# Patient Record
Sex: Female | Born: 1985 | Race: White | Hispanic: No | Smoking: Never smoker
Health system: Southern US, Community
[De-identification: ages and names within clinical notes are randomized; demographics above are authoritative.]

## PROBLEM LIST (undated history)

## (undated) DIAGNOSIS — E785 Hyperlipidemia, unspecified: Secondary | ICD-10-CM

## (undated) DIAGNOSIS — M84375A Stress fracture, left foot, initial encounter for fracture: Secondary | ICD-10-CM

## (undated) DIAGNOSIS — S92909A Unspecified fracture of unspecified foot, initial encounter for closed fracture: Secondary | ICD-10-CM

## (undated) DIAGNOSIS — R51 Headache: Secondary | ICD-10-CM

## (undated) DIAGNOSIS — R7989 Other specified abnormal findings of blood chemistry: Secondary | ICD-10-CM

## (undated) DIAGNOSIS — I890 Lymphedema, not elsewhere classified: Secondary | ICD-10-CM

## (undated) DIAGNOSIS — M722 Plantar fascial fibromatosis: Secondary | ICD-10-CM

## (undated) DIAGNOSIS — G43909 Migraine, unspecified, not intractable, without status migrainosus: Secondary | ICD-10-CM

## (undated) HISTORY — DX: Other specified abnormal findings of blood chemistry: R79.89

## (undated) HISTORY — DX: Hyperlipidemia, unspecified: E78.5

## (undated) HISTORY — DX: Headache: R51

## (undated) HISTORY — PX: INTRAUTERINE DEVICE (IUD) INSERTION: SHX5877

## (undated) HISTORY — DX: Migraine, unspecified, not intractable, without status migrainosus: G43.909

## (undated) HISTORY — DX: Stress fracture, left foot, initial encounter for fracture: M84.375A

## (undated) HISTORY — DX: Lymphedema, not elsewhere classified: I89.0

## (undated) HISTORY — DX: Unspecified fracture of unspecified foot, initial encounter for closed fracture: S92.909A

## (undated) HISTORY — DX: Plantar fascial fibromatosis: M72.2

---

## 2012-05-24 ENCOUNTER — Encounter: Payer: Self-pay | Admitting: Family

## 2012-05-24 ENCOUNTER — Ambulatory Visit (INDEPENDENT_AMBULATORY_CARE_PROVIDER_SITE_OTHER): Payer: Self-pay | Admitting: Family

## 2012-05-24 VITALS — BP 108/80 | HR 86 | Ht 66.5 in | Wt 159.0 lb

## 2012-05-24 DIAGNOSIS — M79661 Pain in right lower leg: Secondary | ICD-10-CM

## 2012-05-24 DIAGNOSIS — M25521 Pain in right elbow: Secondary | ICD-10-CM

## 2012-05-24 DIAGNOSIS — M79609 Pain in unspecified limb: Secondary | ICD-10-CM

## 2012-05-24 DIAGNOSIS — M25529 Pain in unspecified elbow: Secondary | ICD-10-CM

## 2012-05-24 LAB — CBC WITH DIFFERENTIAL/PLATELET
Eosinophils Relative: 0.3 % (ref 0.0–5.0)
Lymphs Abs: 2.5 10*3/uL (ref 0.7–4.0)
MCV: 89.6 fl (ref 78.0–100.0)
Monocytes Relative: 5.6 % (ref 3.0–12.0)
Neutro Abs: 5.2 10*3/uL (ref 1.4–7.7)
Platelets: 225 10*3/uL (ref 150.0–400.0)
RBC: 4.31 Mil/uL (ref 3.87–5.11)

## 2012-05-24 LAB — COMPREHENSIVE METABOLIC PANEL
ALT: 11 U/L (ref 0–35)
AST: 18 U/L (ref 0–37)
Albumin: 3.7 g/dL (ref 3.5–5.2)
CO2: 22 mEq/L (ref 19–32)
Calcium: 8.8 mg/dL (ref 8.4–10.5)
Chloride: 107 mEq/L (ref 96–112)
GFR: 117.82 mL/min (ref >60.00)
Potassium: 3.9 mEq/L (ref 3.5–5.1)
Sodium: 137 mEq/L (ref 135–145)
Total Protein: 6.9 g/dL (ref 6.0–8.3)

## 2012-05-24 NOTE — Progress Notes (Signed)
Subjective:    Patient ID: Wendy Molina, female    DOB: [birth year], 27 y.o.   MRN: [id number]  HPI 27 year old white female, nonsmoker, new patient to the practice is in to be established. She has concerns about lateral calf pain x2 years. Reports the pain occurring about once a week, describes it as a cramping sensation to both calves that lasts about an hour. She rates the pain as 7-8/10, that responds to Motrin. She also takes warm baths that helps. She's had this pain evaluated in the past her primary care provider his blood tocolysis been idiopathic.  Patient also complains of bilateral arm pain, the left worse in the right, ongoing x2 months. She describes it as a tingling and sharp sensation. Denies any swelling. Denies any chest pain. Has had an increase in stress in her life over the last several weeks that she feels will improve. Has been tested for rheumatoid arthritis in the past that was negative. A family history of any thyroid dysfunction. No history of any neck injuries.   Review of Systems  Constitutional: Negative.   HENT: Negative.   Respiratory: Negative.   Cardiovascular: Negative.   Gastrointestinal: Negative.   Endocrine: Negative.   Genitourinary: Negative.   Musculoskeletal: Positive for myalgias. Negative for back pain, joint swelling and arthralgias.       Bilateral calf pain and bilateral arm pain  Neurological: Negative.   Hematological: Negative.   Psychiatric/Behavioral: Negative.    Past Medical History  Diagnosis Date  . Hyperlipidemia   . Headache     History   Social History  . Marital Status: Single    Spouse Name: N/A    Number of Children: N/A  . Years of Education: N/A   Occupational History  . Not on file.   Social History Main Topics  . Smoking status: Never Smoker   . Smokeless tobacco: Not on file  . Alcohol Use: Yes  . Drug Use: No  . Sexually Active: Not on file   Other Topics Concern  . Not on file   Social History  Narrative  . No narrative on file    No past surgical history on file.  Family History  Problem Relation Age of Onset  . Arthritis Mother   . Heart disease Maternal Aunt     Allergies not on file  No current outpatient prescriptions on file prior to visit.   No current facility-administered medications on file prior to visit.    BP 108/80  Pulse 86  Ht 5' 6.5" (1.689 m)  Wt 159 lb (72.122 kg)  BMI 25.28 kg/m2  SpO2 99%  LMP 04/18/2014chart    Objective:   Physical Exam  Constitutional: She is oriented to person, place, and time. She appears well-developed and well-nourished.  Neck: Normal range of motion. Neck supple.  Cardiovascular: Normal rate, regular rhythm and normal heart sounds.   Pulmonary/Chest: Effort normal and breath sounds normal.  Abdominal: Soft. Bowel sounds are normal.  Musculoskeletal: Normal range of motion. She exhibits no edema and no tenderness.  Neurological: She is alert and oriented to person, place, and time. She has normal reflexes.  Skin: Skin is warm and dry.  Psychiatric: She has a normal mood and affect.          Assessment & Plan:  Assessment:  1. Myalgias 2. Bilateral calf pain 3. The lateral arm pain  Plan: Send labs to include ANA, TSH, CMP, CBC will notify patient pending results. We'll discuss further  treatment plan thereafter. Consider ultrasound of the lower extremities bilaterally if her labs are normal. Also consider anxiety as a culprit.

## 2012-05-25 ENCOUNTER — Ambulatory Visit: Payer: Managed Care, Other (non HMO)

## 2012-05-25 DIAGNOSIS — R6889 Other general symptoms and signs: Secondary | ICD-10-CM

## 2012-05-25 LAB — ANA: Anti Nuclear Antibody(ANA): NEGATIVE

## 2012-06-15 ENCOUNTER — Other Ambulatory Visit (INDEPENDENT_AMBULATORY_CARE_PROVIDER_SITE_OTHER): Payer: Managed Care, Other (non HMO)

## 2012-06-15 DIAGNOSIS — Z0000 Encounter for general adult medical examination without abnormal findings: Secondary | ICD-10-CM

## 2012-06-15 LAB — LIPID PANEL
Cholesterol: 181 mg/dL (ref 0–200)
HDL: 61.5 mg/dL (ref >39.00)
LDL Cholesterol: 105 mg/dL — ABNORMAL HIGH (ref 0–99)
Triglycerides: 73 mg/dL (ref 0.0–149.0)
VLDL: 14.6 mg/dL (ref 0.0–40.0)

## 2012-06-15 LAB — CBC WITH DIFFERENTIAL/PLATELET
Basophils Relative: 1 % (ref 0.0–3.0)
Eosinophils Relative: 0.6 % (ref 0.0–5.0)
HCT: 40.8 % (ref 36.0–46.0)
Hemoglobin: 14.1 g/dL (ref 12.0–15.0)
Lymphs Abs: 2.8 10*3/uL (ref 0.7–4.0)
Monocytes Relative: 8 % (ref 3.0–12.0)
Neutro Abs: 3.1 10*3/uL (ref 1.4–7.7)
RBC: 4.61 Mil/uL (ref 3.87–5.11)
RDW: 13.4 % (ref 11.5–14.6)

## 2012-06-15 LAB — BASIC METABOLIC PANEL
GFR: 99.6 mL/min (ref >60.00)
Glucose, Bld: 81 mg/dL (ref 70–99)
Potassium: 4.3 mEq/L (ref 3.5–5.1)
Sodium: 138 mEq/L (ref 135–145)

## 2012-06-15 LAB — HEPATIC FUNCTION PANEL
ALT: 11 U/L (ref 0–35)
AST: 19 U/L (ref 0–37)
Total Bilirubin: 0.8 mg/dL (ref 0.3–1.2)
Total Protein: 7.1 g/dL (ref 6.0–8.3)

## 2012-06-15 LAB — POCT URINALYSIS DIPSTICK
Glucose, UA: NEGATIVE
Spec Grav, UA: 1.025
Urobilinogen, UA: 0.2
pH, UA: 6.5

## 2012-06-22 ENCOUNTER — Encounter: Payer: Self-pay | Admitting: Family

## 2012-06-22 ENCOUNTER — Ambulatory Visit (INDEPENDENT_AMBULATORY_CARE_PROVIDER_SITE_OTHER): Payer: Managed Care, Other (non HMO) | Admitting: Family

## 2012-06-22 VITALS — BP 116/78 | HR 78 | Ht 66.0 in | Wt 156.0 lb

## 2012-06-22 DIAGNOSIS — Z23 Encounter for immunization: Secondary | ICD-10-CM

## 2012-06-22 DIAGNOSIS — Z0000 Encounter for general adult medical examination without abnormal findings: Secondary | ICD-10-CM

## 2012-06-22 NOTE — Progress Notes (Signed)
  Subjective:    Patient ID: Wendy Molina, female    DOB: [birth year], 27 y.o.   MRN: [id number]  HPI 27 year old white female, nonsmoker is in for complete physical exam today. She denies any concerns. She sees gynecology for Pap and pelvic exams.   Review of Systems  Constitutional: Negative.   HENT: Negative.   Eyes: Negative.   Respiratory: Negative.   Cardiovascular: Negative.   Gastrointestinal: Negative.   Endocrine: Negative.   Genitourinary: Negative.   Musculoskeletal: Negative.   Skin: Negative.   Allergic/Immunologic: Negative.   Neurological: Negative.   Hematological: Negative.   Psychiatric/Behavioral: Negative.    Past Medical History  Diagnosis Date  . Hyperlipidemia   . Headache(784.0)     History   Social History  . Marital Status: Single    Spouse Name: N/A    Number of Children: N/A  . Years of Education: N/A   Occupational History  . Not on file.   Social History Main Topics  . Smoking status: Never Smoker   . Smokeless tobacco: Not on file  . Alcohol Use: Yes  . Drug Use: No  . Sexually Active: Not on file   Other Topics Concern  . Not on file   Social History Narrative  . No narrative on file    History reviewed. No pertinent past surgical history.  Family History  Problem Relation Age of Onset  . Arthritis Mother   . Heart disease Maternal Aunt     No Known Allergies  Current Outpatient Prescriptions on File Prior to Visit  Medication Sig Dispense Refill  . norelgestromin-ethinyl estradiol (ORTHO EVRA) 150-20 MCG/24HR transdermal patch Place 1 patch onto the skin once a week.       No current facility-administered medications on file prior to visit.    Pulse 78  Ht 5\' 6"  (1.676 m)  Wt 156 lb (70.761 kg)  BMI 25.19 kg/m2  SpO2 99%  LMP 04/18/2014chart    Objective:   Physical Exam  Constitutional: She is oriented to person, place, and time. She appears well-developed and well-nourished.  HENT:  Right Ear:  External ear normal.  Left Ear: External ear normal.  Nose: Nose normal.  Mouth/Throat: Oropharynx is clear and moist.  Neck: Normal range of motion. Neck supple.  Cardiovascular: Normal rate, regular rhythm and normal heart sounds.   Pulmonary/Chest: Effort normal and breath sounds normal.  Abdominal: Soft. Bowel sounds are normal.  Musculoskeletal: Normal range of motion.  Neurological: She is alert and oriented to person, place, and time.  Skin: Skin is warm and dry.  Psychiatric: She has a normal mood and affect.      TDAP administered    Assessment & Plan:  Assessment:  1. CPX  Plan: Anticipatory guidance appropriate for age to include safe sex practices, monthly self breast exams, seatbelt safety, hand gun safety, motorcycles and ATVs.Call the office with any questions or concerns. Recheck as scheduled and as needed.

## 2012-08-19 ENCOUNTER — Encounter: Payer: Self-pay | Admitting: Family

## 2012-08-19 ENCOUNTER — Ambulatory Visit (INDEPENDENT_AMBULATORY_CARE_PROVIDER_SITE_OTHER): Payer: Managed Care, Other (non HMO) | Admitting: Family

## 2012-08-19 VITALS — BP 110/68 | HR 88 | Wt 162.0 lb

## 2012-08-19 DIAGNOSIS — L0291 Cutaneous abscess, unspecified: Secondary | ICD-10-CM

## 2012-08-19 DIAGNOSIS — L0390 Cellulitis, unspecified: Secondary | ICD-10-CM

## 2012-08-19 MED ORDER — NORELGESTROMIN-ETH ESTRADIOL 150-35 MCG/24HR TD PTWK: 1.0000 | MEDICATED_PATCH | TRANSDERMAL | Status: DC

## 2012-08-19 MED ORDER — DOXYCYCLINE HYCLATE 100 MG PO TABS: 100.0000 mg | ORAL_TABLET | Freq: Two times a day (BID) | ORAL | Status: DC

## 2012-08-19 NOTE — Patient Instructions (Addendum)
Abscess An abscess is an infected area that contains a collection of pus and debris.It can occur in almost any part of the body. An abscess is also known as a furuncle or boil. CAUSES  An abscess occurs when tissue gets infected. This can occur from blockage of oil or sweat glands, infection of hair follicles, or a minor injury to the skin. As the body tries to fight the infection, pus collects in the area and creates pressure under the skin. This pressure causes pain. People with weakened immune systems have difficulty fighting infections and get certain abscesses more often.  SYMPTOMS Usually an abscess develops on the skin and becomes a painful mass that is red, warm, and tender. If the abscess forms under the skin, you may feel a moveable soft area under the skin. Some abscesses break open (rupture) on their own, but most will continue to get worse without care. The infection can spread deeper into the body and eventually into the bloodstream, causing you to feel ill.  DIAGNOSIS  Your caregiver will take your medical history and perform a physical exam. A sample of fluid may also be taken from the abscess to determine what is causing your infection. TREATMENT  Your caregiver may prescribe antibiotic medicines to fight the infection. However, taking antibiotics alone usually does not cure an abscess. Your caregiver may need to make a small cut (incision) in the abscess to drain the pus. In some cases, gauze is packed into the abscess to reduce pain and to continue draining the area. HOME CARE INSTRUCTIONS   Only take over-the-counter or prescription medicines for pain, discomfort, or fever as directed by your caregiver.  If you were prescribed antibiotics, take them as directed. Finish them even if you start to feel better.  If gauze is used, follow your caregiver's directions for changing the gauze.  To avoid spreading the infection:  Keep your draining abscess covered with a  bandage.  Wash your hands well.  Do not share personal care items, towels, or whirlpools with others.  Avoid skin contact with others.  Keep your skin and clothes clean around the abscess.  Keep all follow-up appointments as directed by your caregiver. SEEK MEDICAL CARE IF:   You have increased pain, swelling, redness, fluid drainage, or bleeding.  You have muscle aches, chills, or a general ill feeling.  You have a fever. MAKE SURE YOU:   Understand these instructions.  Will watch your condition.  Will get help right away if you are not doing well or get worse. Document Released: 10/22/2004 Document Revised: 07/14/2011 Document Reviewed: 03/27/2011 [employer] Patient Information 2014 [employer].  

## 2012-08-19 NOTE — Progress Notes (Signed)
  Subjective:    Patient ID: Wendy Molina, female    DOB: 01-03-1986, 27 y.o.   MRN: [id number]  HPI 27 year old white female, nonsmoker is in today with complaints knot behind her right ear present times several days. She describes it as itchy, painful and tender to touch. Denies any sneezing, coughing or congestion. No drainage or discharge.   Review of Systems  Constitutional: Negative.   HENT: Positive for ear pain. Negative for congestion, rhinorrhea, postnasal drip and ear discharge.   Respiratory: Negative.   Cardiovascular: Negative.   Gastrointestinal: Negative.   Musculoskeletal: Negative.   Skin: Negative.   Allergic/Immunologic: Negative.   Neurological: Negative.   Hematological: Negative.   Psychiatric/Behavioral: Negative.    Past Medical History  Diagnosis Date  . Hyperlipidemia   . Headache(784.0)     History   Social History  . Marital Status: Single    Spouse Name: N/A    Number of Children: N/A  . Years of Education: N/A   Occupational History  . Not on file.   Social History Main Topics  . Smoking status: Never Smoker   . Smokeless tobacco: Not on file  . Alcohol Use: Yes  . Drug Use: No  . Sexually Active: Not on file   Other Topics Concern  . Not on file   Social History Narrative  . No narrative on file    History reviewed. No pertinent past surgical history.  Family History  Problem Relation Age of Onset  . Arthritis Mother   . Heart disease Maternal Aunt     No Known Allergies  No current outpatient prescriptions on file prior to visit.   No current facility-administered medications on file prior to visit.    BP 110/68  Pulse 88  Wt 162 lb (73.483 kg)  BMI 26.16 kg/m2  SpO2 99%chart    Objective:   Physical Exam  Constitutional: She is oriented to person, place, and time. She appears well-developed and well-nourished.  HENT:  Right Ear: External ear normal.  Left Ear: External ear normal.  Nose: Nose normal.   Mouth/Throat: Oropharynx is clear and moist.  Small, tender, 0.5 cm palpable lesion noted to the posterior auricle. No drainage or discharge. Redness noted to the site  Neck: Normal range of motion. Neck supple.  Cardiovascular: Normal rate, regular rhythm and normal heart sounds.   Pulmonary/Chest: Effort normal and breath sounds normal.  Neurological: She is alert and oriented to person, place, and time.  Skin: Skin is warm and dry.  Psychiatric: She has a normal mood and affect.          Assessment & Plan:  Assessment: 1. Cellulitis with small abscess  Plan: Doxycycline 100 mg twice a day x7 days. Call the office with any questions or concerns. Check as scheduled, and as needed.

## 2012-09-21 ENCOUNTER — Ambulatory Visit (INDEPENDENT_AMBULATORY_CARE_PROVIDER_SITE_OTHER): Payer: Managed Care, Other (non HMO) | Admitting: Certified Nurse Midwife

## 2012-09-21 ENCOUNTER — Encounter: Payer: Self-pay | Admitting: Certified Nurse Midwife

## 2012-09-21 VITALS — BP 122/78 | HR 60 | Temp 98.9°F | Ht 66.0 in | Wt 163.0 lb

## 2012-09-21 DIAGNOSIS — Z202 Contact with and (suspected) exposure to infections with a predominantly sexual mode of transmission: Secondary | ICD-10-CM

## 2012-09-21 DIAGNOSIS — B373 Candidiasis of vulva and vagina: Secondary | ICD-10-CM

## 2012-09-21 DIAGNOSIS — N760 Acute vaginitis: Secondary | ICD-10-CM

## 2012-09-21 DIAGNOSIS — B3731 Acute candidiasis of vulva and vagina: Secondary | ICD-10-CM

## 2012-09-21 MED ORDER — METRONIDAZOLE 0.75 % VA GEL: 1.0000 | Freq: Every day | VAGINAL | Status: DC

## 2012-09-21 MED ORDER — FLUCONAZOLE 150 MG PO TABS: 150.0000 mg | ORAL_TABLET | Freq: Once | ORAL | Status: DC

## 2012-09-21 NOTE — Progress Notes (Signed)
Note reviewed, agree with plan.  Nemesio Castrillon, MD  

## 2012-09-21 NOTE — Progress Notes (Signed)
27 y.o.Single Caucasian female G0P0000 with a 2 week(s) history of the following:discharge described as white and thick and vulvar itching. No new personal products or thong use. Denies vaginal odor or bleeding. Sexually active: yes Last sexual activity:2 weeks ago.. Pt also reports the following associated symptoms: pain with intercourse at last occurrence. Desires STD screening. Patient has not tried over the counter treatment.   LMP: 09/13/12  Contraception; Ortho Evra, consistent use.  O: Healthy female,WDWN Affect: normal, orientation x 3    Exam:  RUE:AVWUJW, Bartholin's, Urethra, Skene's normal, no lesions noted                Vag:no lesions, discharge: copious, white, watery and malodorous, pH 5.5, wet prep done                Cx:  normal appearance, ectropian, scant bleeding with touch and non tender, no CMT                Uterus:anteverted, non-tender, normal shape and consistency                Adnexa: normal adnexa and no mass, fullness, tenderness  Wet Prep shows: Positive for Yeast and BV, negative for Trich  A:BV Yeast vaginitis STD screening  P:Reviewed findings of yeast and BV. Questions addressed. Rx Metrogel see order Rx Diflucan see order Lab:GC/Chlamydia   Symptomatic local care discussed. Abstinence from intercourse discussed. Discussed safe sex.  Rv prn

## 2012-09-22 LAB — IPS N GONORRHOEA AND CHLAMYDIA BY PCR

## 2012-10-14 ENCOUNTER — Ambulatory Visit: Payer: Managed Care, Other (non HMO) | Admitting: Family

## 2012-10-17 ENCOUNTER — Ambulatory Visit (INDEPENDENT_AMBULATORY_CARE_PROVIDER_SITE_OTHER): Payer: Managed Care, Other (non HMO) | Admitting: Family

## 2012-10-17 ENCOUNTER — Encounter: Payer: Self-pay | Admitting: Family

## 2012-10-17 VITALS — BP 120/80 | HR 74 | Wt 160.0 lb

## 2012-10-17 DIAGNOSIS — N920 Excessive and frequent menstruation with regular cycle: Secondary | ICD-10-CM

## 2012-10-17 DIAGNOSIS — R51 Headache: Secondary | ICD-10-CM

## 2012-10-17 NOTE — Patient Instructions (Signed)

## 2012-10-18 ENCOUNTER — Encounter: Payer: Self-pay | Admitting: Family

## 2012-10-18 LAB — CBC WITH DIFFERENTIAL/PLATELET
Basophils Absolute: 0.1 10*3/uL (ref 0.0–0.1)
Eosinophils Absolute: 0.1 10*3/uL (ref 0.0–0.7)
Eosinophils Relative: 0.8 % (ref 0.0–5.0)
Lymphocytes Relative: 27.9 % (ref 12.0–46.0)
MCHC: 33.9 g/dL (ref 30.0–36.0)
MCV: 88.3 fl (ref 78.0–100.0)
Monocytes Absolute: 0.7 10*3/uL (ref 0.1–1.0)
Neutrophils Relative %: 63.3 % (ref 43.0–77.0)
Platelets: 265 10*3/uL (ref 150.0–400.0)
RBC: 4.36 Mil/uL (ref 3.87–5.11)
RDW: 13.6 % (ref 11.5–14.6)
WBC: 10.1 10*3/uL (ref 4.5–10.5)

## 2012-10-18 LAB — BASIC METABOLIC PANEL
Calcium: 9.3 mg/dL (ref 8.4–10.5)
Chloride: 108 mEq/L (ref 96–112)
GFR: 119.63 mL/min (ref >60.00)
Glucose, Bld: 87 mg/dL (ref 70–99)
Potassium: 4.5 mEq/L (ref 3.5–5.1)
Sodium: 137 mEq/L (ref 135–145)

## 2012-10-18 NOTE — Progress Notes (Signed)
Subjective:    Patient ID: Wendy Molina, female    DOB: 1985-07-16, 27 y.o.   MRN: [id number]  HPI 27 year old white female, nonsmoker is in with complaints of a headache off and on x2 weeks. The headache typically comes at the crown of her hand and last all day. She takes Motrin that helps. Describes it as more of a pressure than pain. Has sensitivity to light but not noise. No nausea vomiting. Normal stress level. Consumes about 10 ounces of caffeine daily. Last menstrual period 2 weeks ago and was much heavier than usual. Denies any concerns or pregnancy. Is on Ortho Evra.  Review of Systems  Constitutional: Negative.   Respiratory: Negative.   Cardiovascular: Negative.   Endocrine: Negative.   Genitourinary: Positive for menstrual problem.       Menstrual cycle heavier this month and normal  Musculoskeletal: Negative.   Skin: Negative.   Allergic/Immunologic: Negative.   Neurological: Positive for headaches. Negative for dizziness and light-headedness.  Hematological: Negative.   Psychiatric/Behavioral: Negative.    Past Medical History  Diagnosis Date  . Hyperlipidemia   . Headache(784.0)     migraine w/o aura    History   Social History  . Marital Status: Single    Spouse Name: N/A    Number of Children: 0  . Years of Education: N/A   Occupational History  . Not on file.   Social History Main Topics  . Smoking status: Never Smoker   . Smokeless tobacco: Never Used  . Alcohol Use: Yes  . Drug Use: No  . Sexual Activity: Yes    Partners: Male    Birth Control/ Protection: Pill   Other Topics Concern  . Not on file   Social History Narrative  . No narrative on file    History reviewed. No pertinent past surgical history.  Family History  Problem Relation Age of Onset  . Arthritis Mother   . Heart disease Maternal Aunt   . Lung cancer Maternal Grandmother   . Leukemia Paternal Grandmother     unsure correct type of cancer    No Known  Allergies  Current Outpatient Prescriptions on File Prior to Visit  Medication Sig Dispense Refill  . Multiple Vitamin (MULTIVITAMIN) tablet Take 1 tablet by mouth daily.      . norelgestromin-ethinyl estradiol (ORTHO EVRA) 150-35 MCG/24HR transdermal patch Place 1 patch onto the skin once a week.  3 patch  11  . fluconazole (DIFLUCAN) 150 MG tablet Take 1 tablet (150 mg total) by mouth once. Take one tablet.  Repeat in 5 days.  2 tablet  0  . metroNIDAZOLE (METROGEL) 0.75 % vaginal gel Place 1 Applicatorful vaginally at bedtime. For  5 days  70 g  0   No current facility-administered medications on file prior to visit.    BP 120/80  Pulse 74  Wt 160 lb (72.576 kg)  BMI 25.84 kg/m2  LMP 08/19/2014chart    Objective:   Physical Exam  Constitutional: She is oriented to person, place, and time. She appears well-developed and well-nourished.  HENT:  Right Ear: External ear normal.  Left Ear: External ear normal.  Nose: Nose normal.  Mouth/Throat: Oropharynx is clear and moist.  Neck: Normal range of motion. Neck supple.  Cardiovascular: Normal rate, regular rhythm and normal heart sounds.   Pulmonary/Chest: Effort normal and breath sounds normal.  Musculoskeletal: Normal range of motion.  Neurological: She is alert and oriented to person, place, and time. She has normal  reflexes. She displays normal reflexes. No cranial nerve deficit. Coordination normal.  Skin: Skin is warm and dry.  Psychiatric: She has a normal mood and affect.          Assessment & Plan:   assessment: 1. Headache likely hormonal related 2. Menorrhagia  Plan: Additionally, patient's menstrual cycles are normal and not heavy. I believe that the headache is related to hormonal imbalance. Ibuprofen as needed for the headache. Labs sent including TSH CBC and BMP when the patient can results. If headache persists, we'll consider a referral or scan.

## 2012-12-01 ENCOUNTER — Other Ambulatory Visit: Payer: Self-pay

## 2013-03-02 ENCOUNTER — Encounter: Payer: Self-pay | Admitting: Family

## 2013-03-02 ENCOUNTER — Ambulatory Visit (INDEPENDENT_AMBULATORY_CARE_PROVIDER_SITE_OTHER): Payer: Managed Care, Other (non HMO) | Admitting: Family

## 2013-03-02 VITALS — BP 140/84 | HR 72 | Temp 98.2°F | Resp 16 | Ht 66.0 in | Wt 164.0 lb

## 2013-03-02 DIAGNOSIS — T148XXA Other injury of unspecified body region, initial encounter: Secondary | ICD-10-CM

## 2013-03-02 DIAGNOSIS — M549 Dorsalgia, unspecified: Secondary | ICD-10-CM

## 2013-03-02 MED ORDER — MELOXICAM 15 MG PO TABS: 15.0000 mg | ORAL_TABLET | Freq: Every day | ORAL | Status: DC

## 2013-03-02 MED ORDER — CYCLOBENZAPRINE HCL 5 MG PO TABS: 5.0000 mg | ORAL_TABLET | Freq: Three times a day (TID) | ORAL | Status: DC | PRN

## 2013-03-02 NOTE — Progress Notes (Signed)
   Subjective:    Patient ID: Wendy Molina, female    DOB: 10/25/1985, 28 y.o.   MRN: [id number]  Back Pain This is a recurrent problem. The current episode started in the past 7 days. The problem occurs constantly. The problem is unchanged. The pain is present in the lumbar spine. The quality of the pain is described as aching. The pain does not radiate. The pain is at a severity of 6/10. The symptoms are aggravated by bending and position. Pertinent negatives include no dysuria, numbness or pelvic pain. She has tried heat for the symptoms. The treatment provided no relief.      Review of Systems  Genitourinary: Negative for dysuria and pelvic pain.  Musculoskeletal: Positive for back pain.  Neurological: Negative for numbness.  All other systems reviewed and are negative.   Past Medical History  Diagnosis Date  . Hyperlipidemia   . Headache(784.0)     migraine w/o aura    History   Social History  . Marital Status: Single    Spouse Name: N/A    Number of Children: 0  . Years of Education: N/A   Occupational History  . Not on file.   Social History Main Topics  . Smoking status: Never Smoker   . Smokeless tobacco: Never Used  . Alcohol Use: Yes  . Drug Use: No  . Sexual Activity: Yes    Partners: Male    Birth Control/ Protection: Pill   Other Topics Concern  . Not on file   Social History Narrative  . No narrative on file    No past surgical history on file.  Family History  Problem Relation Age of Onset  . Arthritis Mother   . Heart disease Maternal Aunt   . Lung cancer Maternal Grandmother   . Leukemia Paternal Grandmother     unsure correct type of cancer    No Known Allergies  Current Outpatient Prescriptions on File Prior to Visit  Medication Sig Dispense Refill  . Multiple Vitamin (MULTIVITAMIN) tablet Take 1 tablet by mouth daily.      . norelgestromin-ethinyl estradiol (ORTHO EVRA) 150-35 MCG/24HR transdermal patch Place 1 patch onto  the skin once a week.  3 patch  11   No current facility-administered medications on file prior to visit.    BP 140/84  Pulse 72  Temp(Src) 98.2 F (36.8 C)  Resp 16  Ht 5\' 6"  (1.676 m)  Wt 164 lb (74.39 kg)  BMI 26.48 kg/m2chart    Objective:   Physical Exam  Constitutional: She is oriented to person, place, and time. She appears well-developed and well-nourished.  HENT:  Head: Normocephalic.  Cardiovascular: Normal rate, regular rhythm and normal heart sounds.   Pulmonary/Chest: Breath sounds normal.  Abdominal: Soft.  Musculoskeletal:  Constant pain to R lower lumbar spine; unchanged with palpation  Neurological: She is alert and oriented to person, place, and time.  Skin: Skin is warm.          Assessment & Plan:  L was seen today for back pain.  Diagnoses and associated orders for this visit:  Back pain - POC Urinalysis Dipstick  Other Orders - cyclobenzaprine (FLEXERIL) 5 MG tablet; Take 1 tablet (5 mg total) by mouth 3 (three) times daily as needed for muscle spasms. - meloxicam (MOBIC) 15 MG tablet; Take 1 tablet (15 mg total) by mouth daily.   Note by Davonna [email or web address], FNP Student

## 2013-03-02 NOTE — Patient Instructions (Signed)
°  Muscle Cramps and Spasms °Muscle cramps and spasms occur when a muscle or muscles tighten and you have no control over this tightening (involuntary muscle contraction). They are a common problem and can develop in any muscle. The most common place is in the calf muscles of the leg. Both muscle cramps and muscle spasms are involuntary muscle contractions, but they also have differences:  °· Muscle cramps are sporadic and painful. They may last a few seconds to a quarter of an hour. Muscle cramps are often more forceful and last longer than muscle spasms. °· Muscle spasms may or may not be painful. They may also last just a few seconds or much longer. °CAUSES  °It is uncommon for cramps or spasms to be due to a serious underlying problem. In many cases, the cause of cramps or spasms is unknown. Some common causes are:  °· Overexertion.   °· Overuse from repetitive motions (doing the same thing over and over).   °· Remaining in a certain position for a long period of time.   °· Improper preparation, form, or technique while performing a sport or activity.   °· Dehydration.   °· Injury.   °· Side effects of some medicines.   °· Abnormally low levels of the salts and ions in your blood (electrolytes), especially potassium and calcium. This could happen if you are taking water pills (diuretics) or you are pregnant.   °Some underlying medical problems can make it more likely to develop cramps or spasms. These include, but are not limited to:  °· Diabetes.   °· Parkinson disease.   °· Hormone disorders, such as thyroid problems.   °· Alcohol abuse.   °· Diseases specific to muscles, joints, and bones.   °· Blood vessel disease where not enough blood is getting to the muscles.   °HOME CARE INSTRUCTIONS  °· Stay well hydrated. Drink enough water and fluids to keep your urine clear or pale yellow. °· It may be helpful to massage, stretch, and relax the affected muscle. °· For tight or tense muscles, use a warm towel, heating  pad, or hot shower water directed to the affected area. °· If you are sore or have pain after a cramp or spasm, applying ice to the affected area may relieve discomfort. °· Put ice in a plastic bag. °· Place a towel between your skin and the bag. °· Leave the ice on for 15-20 minutes, 03-04 times a day. °· Medicines used to treat a known cause of cramps or spasms may help reduce their frequency or severity. Only take over-the-counter or prescription medicines as directed by your caregiver. °SEEK MEDICAL CARE IF:  °Your cramps or spasms get more severe, more frequent, or do not improve over time.  °MAKE SURE YOU:  °· Understand these instructions. °· Will watch your condition. °· Will get help right away if you are not doing well or get worse. °Document Released: 07/04/2001 Document Revised: 05/09/2012 Document Reviewed: 12/30/2011 °[employer]® Patient Information ©2014 [employer]. ° ° °

## 2013-03-02 NOTE — Progress Notes (Signed)
Pre visit review using our clinic review tool, if applicable. No additional management support is needed unless otherwise documented below in the visit note. 

## 2013-05-09 ENCOUNTER — Ambulatory Visit (INDEPENDENT_AMBULATORY_CARE_PROVIDER_SITE_OTHER): Payer: Managed Care, Other (non HMO) | Admitting: Family

## 2013-05-09 ENCOUNTER — Encounter: Payer: Self-pay | Admitting: Family

## 2013-05-09 VITALS — BP 120/80 | HR 63 | Temp 98.4°F | Wt 159.0 lb

## 2013-05-09 DIAGNOSIS — T148XXA Other injury of unspecified body region, initial encounter: Secondary | ICD-10-CM

## 2013-05-09 DIAGNOSIS — G43909 Migraine, unspecified, not intractable, without status migrainosus: Secondary | ICD-10-CM

## 2013-05-09 LAB — CBC WITH DIFFERENTIAL/PLATELET
BASOS ABS: 0 10*3/uL (ref 0.0–0.1)
BASOS PCT: 0.6 % (ref 0.0–3.0)
Eosinophils Absolute: 0 10*3/uL (ref 0.0–0.7)
Eosinophils Relative: 0.5 % (ref 0.0–5.0)
HCT: 39.4 % (ref 36.0–46.0)
Hemoglobin: 13.4 g/dL (ref 12.0–15.0)
Lymphocytes Relative: 36.9 % (ref 12.0–46.0)
Lymphs Abs: 2.2 10*3/uL (ref 0.7–4.0)
MCHC: 34 g/dL (ref 30.0–36.0)
MCV: 88.8 fl (ref 78.0–100.0)
MONO ABS: 0.5 10*3/uL (ref 0.1–1.0)
Monocytes Relative: 7.6 % (ref 3.0–12.0)
NEUTROS PCT: 54.4 % (ref 43.0–77.0)
Neutro Abs: 3.3 10*3/uL (ref 1.4–7.7)
Platelets: 223 10*3/uL (ref 150.0–400.0)
RBC: 4.44 Mil/uL (ref 3.87–5.11)
RDW: 14.4 % (ref 11.5–14.6)
WBC: 6.1 10*3/uL (ref 4.5–10.5)

## 2013-05-09 LAB — COMPREHENSIVE METABOLIC PANEL
ALT: 26 U/L (ref 0–35)
AST: 16 U/L (ref 0–37)
Albumin: 3.6 g/dL (ref 3.5–5.2)
Alkaline Phosphatase: 41 U/L (ref 39–117)
BUN: 12 mg/dL (ref 6–23)
CO2: 25 mEq/L (ref 19–32)
CREATININE: 0.7 mg/dL (ref 0.4–1.2)
Calcium: 8.9 mg/dL (ref 8.4–10.5)
Chloride: 106 mEq/L (ref 96–112)
GFR: 107.27 mL/min (ref >60.00)
Glucose, Bld: 75 mg/dL (ref 70–99)
Potassium: 4.4 mEq/L (ref 3.5–5.1)
Sodium: 137 mEq/L (ref 135–145)
Total Bilirubin: 0.6 mg/dL (ref 0.3–1.2)
Total Protein: 6.7 g/dL (ref 6.0–8.3)

## 2013-05-09 LAB — SEDIMENTATION RATE: Sed Rate: 7 mm/hr (ref 0–22)

## 2013-05-09 LAB — IRON: IRON: 109 ug/dL (ref 42–145)

## 2013-05-09 MED ORDER — KETOROLAC TROMETHAMINE 60 MG/2ML IM SOLN
60.0000 mg | Freq: Once | INTRAMUSCULAR | Status: AC
  Administered 2013-05-09: 60 mg via INTRAMUSCULAR

## 2013-05-09 NOTE — Progress Notes (Signed)
Subjective:    Patient ID: Wendy Molina Wendy Molina, female    DOB: 10/24/1985, 28 y.o.   MRN: [id number]  HPI 28 year old white female, nonsmoker is in today with complaints of dull, throbbing headache that persisted around her left eye x2 weeks. She's been taking over-the-counter Motrin that helps the pain. Reports increased stress and decrease caffeine intake. Denies any blurred vision or double vision. Denies nausea or vomiting but has had sensitivity to light and noise. Last menstrual period was 04/26/2013. The concerns of pregnancy. Does have concerns of increased bruising like to have an iron level checked today.   Review of Systems  Constitutional: Negative.   Eyes: Negative.   Respiratory: Negative.   Cardiovascular: Negative.   Gastrointestinal: Negative.   Endocrine: Negative.   Musculoskeletal: Negative.   Skin: Negative.   Allergic/Immunologic: Negative for food allergies.  Hematological: Negative.   Psychiatric/Behavioral: Negative.    Past Medical History  Diagnosis Date  . Hyperlipidemia   . Headache(784.0)     migraine w/o aura    History   Social History  . Marital Status: Single    Spouse Name: N/A    Number of Children: 0  . Years of Education: N/A   Occupational History  . Not on file.   Social History Main Topics  . Smoking status: Never Smoker   . Smokeless tobacco: Never Used  . Alcohol Use: Yes  . Drug Use: No  . Sexual Activity: Yes    Partners: Male    Birth Control/ Protection: Pill   Other Topics Concern  . Not on file   Social History Narrative  . No narrative on file    No past surgical history on file.  Family History  Problem Relation Age of Onset  . Arthritis Mother   . Heart disease Maternal Aunt   . Lung cancer Maternal Grandmother   . Leukemia Paternal Grandmother     unsure correct type of cancer    No Known Allergies  Current Outpatient Prescriptions on File Prior to Visit  Medication Sig Dispense Refill  .  Multiple Vitamin (MULTIVITAMIN) tablet Take 1 tablet by mouth daily.      . norelgestromin-ethinyl estradiol (ORTHO EVRA) 150-35 MCG/24HR transdermal patch Place 1 patch onto the skin once a week.  3 patch  11  . cyclobenzaprine (FLEXERIL) 5 MG tablet Take 1 tablet (5 mg total) by mouth 3 (three) times daily as needed for muscle spasms.  30 tablet  0  . meloxicam (MOBIC) 15 MG tablet Take 1 tablet (15 mg total) by mouth daily.  30 tablet  0   No current facility-administered medications on file prior to visit.    BP 120/80  Pulse 63  Temp(Src) 98.4 F (36.9 C) (Oral)  Wt 159 lb (72.122 kg)  SpO2 98%chart will    Objective:   Physical Exam  Constitutional: She is oriented to person, place, and time. She appears well-developed and well-nourished.  HENT:  Right Ear: External ear normal.  Nose: Nose normal.  Mouth/Throat: Oropharynx is clear and moist.  Neck: Normal range of motion. Neck supple.  Cardiovascular: Normal rate, regular rhythm and normal heart sounds.   Pulmonary/Chest: Effort normal and breath sounds normal.  Neurological: She is alert and oriented to person, place, and time. She has normal reflexes.  Skin: Skin is warm and dry.  Psychiatric: She has a normal mood and affect.          Assessment & Plan:  L was seen today  for headache.  Diagnoses and associated orders for this visit:  Migraine headache - ketorolac (TORADOL) injection 60 mg; Inject 2 mLs (60 mg total) into the muscle once. - CBC with Differential - CMP - Sedimentation Rate - Iron  Bruising - CBC with Differential - Iron   Call the office with any questions or concerns. For headache preventative medicine pending the results of labs

## 2013-05-09 NOTE — Progress Notes (Signed)
Pre visit review using our clinic review tool, if applicable. No additional management support is needed unless otherwise documented below in the visit note. 

## 2013-05-09 NOTE — Patient Instructions (Signed)
Stress Management Stress is a state of physical or mental tension that often results from changes in your life or normal routine. Some common causes of stress are:  Death of a loved one.  Injuries or severe illnesses.  Getting fired or changing jobs.  Moving into a new home. Other causes may be:  Sexual problems.  Business or financial losses.  Taking on a large debt.  Regular conflict with someone at home or at work.  Constant tiredness from lack of sleep. It is not just bad things that are stressful. It may be stressful to:  Win the lottery.  Get married.  Buy a new car. The amount of stress that can be easily tolerated varies from person to person. Changes generally cause stress, regardless of the types of change. Too much stress can affect your health. It may lead to physical or emotional problems. Too little stress (boredom) may also become stressful. SUGGESTIONS TO REDUCE STRESS:  Talk things over with your family and friends. It often is helpful to share your concerns and worries. If you feel your problem is serious, you may want to get help from a professional counselor.  Consider your problems one at a time instead of lumping them all together. Trying to take care of everything at once may seem impossible. List all the things you need to do and then start with the most important one. Set a goal to accomplish 2 or 3 things each day. If you expect to do too many in a single day you will naturally fail, causing you to feel even more stressed.  Do not use alcohol or drugs to relieve stress. Although you may feel better for a short time, they do not remove the problems that caused the stress. They can also be habit forming.  Exercise regularly - at least 3 times per week. Physical exercise can help to relieve that "uptight" feeling and will relax you.  The shortest distance between despair and hope is often a good night's sleep.  Go to bed and get up on time allowing  yourself time for appointments without being rushed.  Take a short "time-out" period from any stressful situation that occurs during the day. Close your eyes and take some deep breaths. Starting with the muscles in your face, tense them, hold it for a few seconds, then relax. Repeat this with the muscles in your neck, shoulders, hand, stomach, back and legs.  Take good care of yourself. Eat a balanced diet and get plenty of rest.  Schedule time for having fun. Take a break from your daily routine to relax. HOME CARE INSTRUCTIONS   Call if you feel overwhelmed by your problems and feel you can no longer manage them on your own.  Return immediately if you feel like hurting yourself or someone else. Document Released: 07/08/2000 Document Revised: 04/06/2011 Document Reviewed: 09/06/2012 [facility] Patient Information 2014 [place], [place].   Migraine Headache A migraine headache is an intense, throbbing pain on one or both sides of your head. A migraine can last for 30 minutes to several hours. CAUSES  The exact cause of a migraine headache is not always known. However, a migraine may be caused when nerves in the brain become irritated and release chemicals that cause inflammation. This causes pain. Certain things may also trigger migraines, such as:  Alcohol.  Smoking.  Stress.  Menstruation.  Aged cheeses.  Foods or drinks that contain nitrates, glutamate, aspartame, or tyramine.  Lack of sleep.  Chocolate.  Caffeine.  Hunger.  Physical exertion.  Fatigue.  Medicines used to treat chest pain (nitroglycerine), birth control pills, estrogen, and some blood pressure medicines. SIGNS AND SYMPTOMS  Pain on one or both sides of your head.  Pulsating or throbbing pain.  Severe pain that prevents daily activities.  Pain that is aggravated by any physical activity.  Nausea, vomiting, or both.  Dizziness.  Pain with exposure to bright lights, loud noises, or  activity.  General sensitivity to bright lights, loud noises, or smells. Before you get a migraine, you may get warning signs that a migraine is coming (aura). An aura may include:  Seeing flashing lights.  Seeing bright spots, halos, or zig-zag lines.  Having tunnel vision or blurred vision.  Having feelings of numbness or tingling.  Having trouble talking.  Having muscle weakness. DIAGNOSIS  A migraine headache is often diagnosed based on:  Symptoms.  Physical exam.  A CT scan or MRI of your head. These imaging tests cannot diagnose migraines, but they can help rule out other causes of headaches. TREATMENT Medicines may be given for pain and nausea. Medicines can also be given to help prevent recurrent migraines.  HOME CARE INSTRUCTIONS  Only take over-the-counter or prescription medicines for pain or discomfort as directed by your health care provider. The use of long-term narcotics is not recommended.  Lie down in a dark, quiet room when you have a migraine.  Keep a journal to find out what may trigger your migraine headaches. For example, write down:  What you eat and drink.  How much sleep you get.  Any change to your diet or medicines.  Limit alcohol consumption.  Quit smoking if you smoke.  Get 7 9 hours of sleep, or as recommended by your health care provider.  Limit stress.  Keep lights dim if bright lights bother you and make your migraines worse. SEEK IMMEDIATE MEDICAL CARE IF:   Your migraine becomes severe.  You have a fever.  You have a stiff neck.  You have vision loss.  You have muscular weakness or loss of muscle control.  You start losing your balance or have trouble walking.  You feel faint or pass out.  You have severe symptoms that are different from your first symptoms. MAKE SURE YOU:   Understand these instructions.  Will watch your condition.  Will get help right away if you are not doing well or get worse. Document  Released: 01/12/2005 Document Revised: 11/02/2012 Document Reviewed: 09/19/2012 [facility] Patient Information 2014 [place], [place].

## 2013-05-16 ENCOUNTER — Other Ambulatory Visit: Payer: Self-pay | Admitting: Family

## 2013-05-16 MED ORDER — TRAMADOL HCL 50 MG PO TABS: 50.0000 mg | ORAL_TABLET | Freq: Three times a day (TID) | ORAL | Status: DC | PRN

## 2013-05-19 ENCOUNTER — Ambulatory Visit (INDEPENDENT_AMBULATORY_CARE_PROVIDER_SITE_OTHER): Payer: Managed Care, Other (non HMO) | Admitting: Nurse Practitioner

## 2013-05-19 ENCOUNTER — Encounter: Payer: Self-pay | Admitting: Nurse Practitioner

## 2013-05-19 VITALS — BP 120/76 | HR 60 | Temp 98.6°F | Ht 66.0 in | Wt 159.0 lb

## 2013-05-19 DIAGNOSIS — N898 Other specified noninflammatory disorders of vagina: Secondary | ICD-10-CM

## 2013-05-19 MED ORDER — METRONIDAZOLE 0.75 % VA GEL: 1.0000 | Freq: Every day | VAGINAL | Status: DC

## 2013-05-19 MED ORDER — FLUCONAZOLE 150 MG PO TABS: 150.0000 mg | ORAL_TABLET | Freq: Once | ORAL | Status: DC

## 2013-05-19 NOTE — Patient Instructions (Signed)

## 2013-05-19 NOTE — Progress Notes (Signed)
Subjective:     Patient ID: Wendy Molina, female   DOB: 05/02/1985, 28 y.o.   MRN: [id number]  HPI   This 28 yo SW Fe complains of vaginal symptoms X 2 days. Vaginal itching and burning externally.  Discharge is thick initially with an odor.   Last pm more brown in color.  Last SA on Saturday.  No fever or chills and no dysuria other than discomfort at the vulva. No lesions.  She is using Ortho Evra patch for birth control.  She is dating someone but not monogamous. She does not use condoms.  Review of Systems  Constitutional: Negative for fever, chills and fatigue.  HENT: Positive for postnasal drip and rhinorrhea.   Respiratory: Negative.   Cardiovascular: Negative.   Gastrointestinal: Negative.  Negative for nausea, vomiting, abdominal pain and diarrhea.  Genitourinary: Positive for vaginal discharge. Negative for dysuria, urgency, frequency, hematuria, flank pain, vaginal pain, menstrual problem, pelvic pain and dyspareunia.  Musculoskeletal: Negative.   Skin: Negative.   Neurological: Negative.   Psychiatric/Behavioral: Negative.        Objective:   Physical Exam  Constitutional: She is oriented to person, place, and time. She appears well-developed and well-nourished. No distress.  Abdominal: Soft. She exhibits no distension and no mass. There is no tenderness. There is no rebound and no guarding.  Genitourinary:  Moderate vaginal bleeding. Unable to get wet prep.   Did obtain specimen for GC & CHl.  Took Ortho Evra patch off on Wednesday.  Neurological: She is alert and oriented to person, place, and time.  Psychiatric: She has a normal mood and affect. Her behavior is normal. Judgment and thought content normal.       Assessment:     Suspect mixed BV and yeast as she states symptoms are similar to last visit R/O GC & Chl    Plan:     Discussed use of condoms each time Also needs AEX in July with Ms. Debbie and encouraged to schedule now and will do rest of STD's at  that time Metrogel vaginal cream hs for 5 nights - will most likely start after menses Diflucan 150 mg #2/ 1 refill

## 2013-05-21 NOTE — Progress Notes (Signed)
Encounter reviewed by Dr. Brook Silva.  

## 2013-05-23 LAB — IPS N GONORRHOEA AND CHLAMYDIA BY PCR

## 2013-05-24 ENCOUNTER — Ambulatory Visit (INDEPENDENT_AMBULATORY_CARE_PROVIDER_SITE_OTHER): Payer: Managed Care, Other (non HMO) | Admitting: Internal Medicine

## 2013-05-24 ENCOUNTER — Encounter: Payer: Self-pay | Admitting: Internal Medicine

## 2013-05-24 VITALS — BP 135/81 | HR 100 | Temp 98.0°F | Wt 157.0 lb

## 2013-05-24 DIAGNOSIS — J029 Acute pharyngitis, unspecified: Secondary | ICD-10-CM

## 2013-05-24 DIAGNOSIS — J069 Acute upper respiratory infection, unspecified: Secondary | ICD-10-CM

## 2013-05-24 LAB — POCT RAPID STREP A (OFFICE): Rapid Strep A Screen: NEGATIVE

## 2013-05-24 MED ORDER — GUAIFENESIN-CODEINE 100-10 MG/5ML PO SOLN
5.0000 mL | Freq: Every evening | ORAL | Status: DC | PRN
Start: 2013-05-24 — End: 2013-06-09

## 2013-05-24 NOTE — Progress Notes (Signed)
Pre visit review using our clinic review tool, if applicable. No additional management support is needed unless otherwise documented below in the visit note. 

## 2013-05-24 NOTE — Progress Notes (Signed)
Subjective:    Patient ID: [name], female    DOB: 02/03/1985, 28 y.o.   MRN: [id number]  DOS:  05/24/2013 Type of  visit: Acute visit Symptoms started 4 days ago with cough, generalized aches. Symptoms are slightly worse,  difficult time sleeping due to cough.  Also developed sore throat, worse in the right side, feels like "dust in the throat" and can't stop coughing.  ROS No fever, some chills. No sinus pain, congestion or nasal discharge No chest congestion No  nausea, vomiting, diarrhea  Past Medical History  Diagnosis Date  . Hyperlipidemia   . Headache(784.0)     migraine w/o aura    History reviewed. No pertinent past surgical history.  History   Social History  . Marital Status: Single    Spouse Name: N/A    Number of Children: 0  . Years of Education: N/A   Occupational History  . Not on file.   Social History Main Topics  . Smoking status: Never Smoker   . Smokeless tobacco: Never Used  . Alcohol Use: Yes  . Drug Use: No  . Sexual Activity: Yes    Partners: Male    Birth Control/ Protection: Pill   Other Topics Concern  . Not on file   Social History Narrative  . No narrative on file        Medication List       This list is accurate as of: 05/24/13  6:09 PM.  Always use your most recent med list.               cyclobenzaprine 5 MG tablet  Commonly known as:  FLEXERIL  Take 1 tablet (5 mg total) by mouth 3 (three) times daily as needed for muscle spasms.     fluconazole 150 MG tablet  Commonly known as:  DIFLUCAN  Take 1 tablet (150 mg total) by mouth once. Take one tablet.  Repeat in 48 hours if symptoms are not completely resolved.     guaiFENesin-codeine 100-10 MG/5ML syrup  Take 5 mLs by mouth at bedtime as needed for cough.     meloxicam 15 MG tablet  Commonly known as:  MOBIC  Take 1 tablet (15 mg total) by mouth daily.     metroNIDAZOLE 0.75 % vaginal gel  Commonly known as:  METROGEL  Place 1 Applicatorful  vaginally at bedtime.     multivitamin tablet  Take 1 tablet by mouth daily.     norelgestromin-ethinyl estradiol 150-35 MCG/24HR transdermal patch  Commonly known as:  ORTHO EVRA  Place 1 patch onto the skin once a week.     traMADol 50 MG tablet  Commonly known as:  ULTRAM  Take 1 tablet (50 mg total) by mouth every 8 (eight) hours as needed.           Objective:   Physical Exam BP 135/81  Pulse 100  Temp(Src) 98 F (36.7 C)  Wt 157 lb (71.215 kg)  SpO2 98%  LMP 04/28/2013 General -- alert, well-developed, NAD.  HEENT-- Not pale. TMs normal, throat walls symmetric, no redness or discharge.  Face symmetric, sinuses not tender to palpation. Nose not congested. Lungs -- normal respiratory effort, no intercostal retractions, no accessory muscle use, and normal breath sounds.  Heart-- normal rate, regular rhythm, no murmur.   Neurologic--  alert & oriented X3. Speech normal, gait normal, strength normal in all extremities.  Psych-- Cognition and judgment appear intact. Cooperative with normal attention span and  concentration. No anxious or depressed appearing.     Assessment & Plan:    URI, URI with predominant sore throat and cough, throat is symmetric, strep test negative. Plan: see instructions

## 2013-05-24 NOTE — Patient Instructions (Signed)
Rest, fluids , tylenol  For cough, take Mucinex DM twice a day as needed  If persistent cough, take codeine at night, will make you sleepy  Call if no better in few days  Call anytime if the symptoms are severe

## 2013-05-25 ENCOUNTER — Encounter: Payer: Self-pay | Admitting: Nurse Practitioner

## 2013-05-26 ENCOUNTER — Ambulatory Visit (INDEPENDENT_AMBULATORY_CARE_PROVIDER_SITE_OTHER): Payer: Managed Care, Other (non HMO) | Admitting: Nurse Practitioner

## 2013-05-26 ENCOUNTER — Encounter: Payer: Self-pay | Admitting: Nurse Practitioner

## 2013-05-26 ENCOUNTER — Telehealth: Payer: Self-pay | Admitting: Emergency Medicine

## 2013-05-26 VITALS — BP 110/76 | HR 104 | Temp 98.9°F | Ht 66.0 in | Wt 158.0 lb

## 2013-05-26 DIAGNOSIS — R300 Dysuria: Secondary | ICD-10-CM

## 2013-05-26 LAB — POCT URINALYSIS DIPSTICK
Bilirubin, UA: NEGATIVE
Glucose, UA: NEGATIVE
Ketones, UA: NEGATIVE
Nitrite, UA: POSITIVE
Urobilinogen, UA: NEGATIVE
pH, UA: 5

## 2013-05-26 MED ORDER — PHENAZOPYRIDINE HCL 200 MG PO TABS: 200.0000 mg | ORAL_TABLET | Freq: Three times a day (TID) | ORAL | Status: DC | PRN

## 2013-05-26 MED ORDER — CIPROFLOXACIN HCL 500 MG PO TABS: 500.0000 mg | ORAL_TABLET | Freq: Two times a day (BID) | ORAL | Status: DC

## 2013-05-26 NOTE — Patient Instructions (Signed)
Urinary Tract Infection  Urinary tract infections (UTIs) can develop anywhere along your urinary tract. Your urinary tract is your body's drainage system for removing wastes and extra water. Your urinary tract includes two kidneys, two ureters, a bladder, and a urethra. Your kidneys are a pair of bean-shaped organs. Each kidney is about the size of your fist. They are located below your ribs, one on each side of your spine.  CAUSES  Infections are caused by microbes, which are microscopic organisms, including fungi, viruses, and bacteria. These organisms are so small that they can only be seen through a microscope. Bacteria are the microbes that most commonly cause UTIs.  SYMPTOMS   Symptoms of UTIs may vary by age and gender of the patient and by the location of the infection. Symptoms in young women typically include a frequent and intense urge to urinate and a painful, burning feeling in the bladder or urethra during urination. Older women and men are more likely to be tired, shaky, and weak and have muscle aches and abdominal pain. A fever may mean the infection is in your kidneys. Other symptoms of a kidney infection include pain in your back or sides below the ribs, nausea, and vomiting.  DIAGNOSIS  To diagnose a UTI, your caregiver will ask you about your symptoms. Your caregiver also will ask to provide a urine sample. The urine sample will be tested for bacteria and white blood cells. White blood cells are made by your body to help fight infection.  TREATMENT   Typically, UTIs can be treated with medication. Because most UTIs are caused by a bacterial infection, they usually can be treated with the use of antibiotics. The choice of antibiotic and length of treatment depend on your symptoms and the type of bacteria causing your infection.  HOME CARE INSTRUCTIONS   If you were prescribed antibiotics, take them exactly as your caregiver instructs you. Finish the medication even if you feel better after you  have only taken some of the medication.   Drink enough water and fluids to keep your urine clear or pale yellow.   Avoid caffeine, tea, and carbonated beverages. They tend to irritate your bladder.   Empty your bladder often. Avoid holding urine for long periods of time.   Empty your bladder before and after sexual intercourse.   After a bowel movement, women should cleanse from front to back. Use each tissue only once.  SEEK MEDICAL CARE IF:    You have back pain.   You develop a fever.   Your symptoms do not begin to resolve within 3 days.  SEEK IMMEDIATE MEDICAL CARE IF:    You have severe back pain or lower abdominal pain.   You develop chills.   You have nausea or vomiting.   You have continued burning or discomfort with urination.  MAKE SURE YOU:    Understand these instructions.   Will watch your condition.   Will get help right away if you are not doing well or get worse.  Document Released: 10/22/2004 Document Revised: 07/14/2011 Document Reviewed: 02/20/2011  Exit[employer] Patient Information 2014 [employer].

## 2013-05-26 NOTE — Telephone Encounter (Signed)
Spoke with patient. Patient states that she is having left sided back pain that started Monday. Is having pain with urination and states she thinks she has a UTI as she has had these symptoms before and was treated for UTI. Patient is currently being treated for yeast infection and will complete treatment today. Patient states she does not have yeast symptoms any longer but would like to be seen for possible UTI. Appointment scheduled for today at 4:15 with Wendy FranklinPatricia Rolen-Grubb, FNP. Patient agreeable.  Routing to provider for final review. Patient agreeable to disposition. Will close encounter

## 2013-05-26 NOTE — Progress Notes (Signed)
S:   28 y.o.Single Caucasian female presents with complaint of UTI. Symptoms began on  Sunday evening. With symptoms of back pain, dysuria, urinary frequency, urinary urgency.  Pertinent negatives include The patient is having constitutional symptoms, including recent cough for URI.  Took Mucinex and codien cough med's.  Sexually active and symptoms not related to post coital.  She has also been treated for BV and yeaset and not SA for 2 weeks. Current method of birth control Hormonal Contraception: Injection, Rings and Patches.  Same partner without change. Last UTI documented year and half ago.  ROS:  no weight loss, fever, night sweats.   There has been URI symptoms of cough, nasal and head congestion that is improving.  O: alert, oriented to person, place, and time, normal mood, behavior, speech, dress, motor activity, and thought processes   thin and healthy  Abdomen: mild tenderness over the suprapubic area  Only slight  CVA tenderness on the left  Pelvic:  deferred   Diagnostic Test:    Urinalysis WBC -large; Nitrate positive; RBC large    urine culture  Assessment:    R/O UTI   Recent BV/ Yeast   Recent URI  Plan:    Medications: ciprofloxacin.   Medication Therapy: 500 mg BID   Lab:TOC if Urine Culture is positive

## 2013-05-26 NOTE — Telephone Encounter (Signed)
Patient has sent mychart message.  Message left to return call to [facility] at [phone].   Will need assessment by triage for appointment.   ----- Message -----  From: Clance BollYORGEN,L Aiza  Sent: 05/25/2013 1:14 PM EDT  To: Lauro FranklinOLEN-GRUBB, PATRICIA, FNP  Subject: Visit Follow-Up Question   Hello,   I have been taking the medications that you gave me for the yeast infection for a couple days, and I think that is getting better. I am still having pain when I use the restroom however, and a sharp pain in my back. I think it might be a UTI (the symptoms are pretty much the same as the last UTI I had). Should I come back in for another visit? Or should I try something OTC?   Thanks!

## 2013-05-27 LAB — URINALYSIS, MICROSCOPIC ONLY
CASTS: NONE SEEN
Crystals: NONE SEEN

## 2013-05-29 LAB — URINE CULTURE: Colony Count: >=100000

## 2013-05-29 NOTE — Progress Notes (Signed)
Encounter reviewed by Dr. Brook Silva.  

## 2013-06-09 ENCOUNTER — Ambulatory Visit: Payer: Managed Care, Other (non HMO)

## 2013-06-09 ENCOUNTER — Ambulatory Visit (INDEPENDENT_AMBULATORY_CARE_PROVIDER_SITE_OTHER): Payer: Managed Care, Other (non HMO)

## 2013-06-09 VITALS — BP 118/60 | HR 80 | Resp 20 | Wt 158.8 lb

## 2013-06-09 DIAGNOSIS — N390 Urinary tract infection, site not specified: Secondary | ICD-10-CM

## 2013-06-09 NOTE — Progress Notes (Signed)
Patient states is feeling much better, but having some itching like the yeast infection may be starting again. States P. Berneice GandyGrubb did give her enough medication refills to treat if needed. Did start the cipro for UTI, but had N/V with the first pill. She was out of town and went to urgent care that changed her medication to a liquid-cephalexin 250/295ml, taking 10ml q 4hrs x 10 days. She did finish this at the beginning of the week. Aware will send her urine for evaluation and call next week with the results. Discussed with Dr Edward JollySilva, who would like her to go ahead and take a Diflucan for yeast symptoms and if no better will be seen next week. Patient aware if symptoms worsen or any concerns over the weekend to go to urgent care.

## 2013-06-10 LAB — URINE CULTURE
Colony Count: NO GROWTH
ORGANISM ID, BACTERIA: NO GROWTH

## 2013-06-16 ENCOUNTER — Telehealth: Payer: Self-pay | Admitting: Emergency Medicine

## 2013-06-16 NOTE — Telephone Encounter (Signed)
Patient states she was returning call, she is not sure who called her. I gave her message from Lauro Franklin, FNP and she is agreeable to plan.    She needs a micro urine recheck in 2 weeks.   [name], your urine culture is negative - that does not explain your symptoms or abnormal urine at the office. Please complete antibiotics and lets do a urine recheck in 2 weeks. Call the office and  make a recheck visit with a nurse for a urine recheck.  She states that she continues with yeast infection symptoms and has taken diflucan x 2, took one 5/15  and one 5/18, this is ongoing from test of cure appointment on 06/09/13. I advised that yeast infection should be treated with diflucan and that potentially she would need more time to allow diflucan to work since she just finished course. Offered office visit with MD for re-evaluation of symptoms, to ensure that she is truly having yeast infection or not another infection. She is agreeable. She will also need repeat ua at time of visit.  Appointment scheduled for 06/21/13 with Dr. Edward Jolly.   Routing to provider for final review. Patient agreeable to disposition. Will close encounter

## 2013-06-21 ENCOUNTER — Ambulatory Visit: Payer: Managed Care, Other (non HMO) | Admitting: Obstetrics and Gynecology

## 2013-06-23 ENCOUNTER — Ambulatory Visit (INDEPENDENT_AMBULATORY_CARE_PROVIDER_SITE_OTHER): Payer: Managed Care, Other (non HMO) | Admitting: *Deleted

## 2013-06-23 VITALS — BP 100/60 | HR 76 | Resp 18 | Ht 66.0 in | Wt 157.0 lb

## 2013-06-23 DIAGNOSIS — N390 Urinary tract infection, site not specified: Secondary | ICD-10-CM

## 2013-06-23 NOTE — Progress Notes (Signed)
Patient in today for TOC. Patient states she finished Abx and denies any Sx. Patient states she still getting over a yeast infection.  Advise pt to call us if any concerns.

## 2013-06-24 LAB — URINALYSIS, MICROSCOPIC ONLY
Bacteria, UA: NONE SEEN
Casts: NONE SEEN
Crystals: NONE SEEN

## 2013-08-01 ENCOUNTER — Encounter: Payer: Self-pay | Admitting: Nurse Practitioner

## 2013-08-01 ENCOUNTER — Ambulatory Visit (INDEPENDENT_AMBULATORY_CARE_PROVIDER_SITE_OTHER): Payer: Managed Care, Other (non HMO) | Admitting: Nurse Practitioner

## 2013-08-01 VITALS — BP 124/82 | HR 92 | Resp 16 | Ht 66.5 in | Wt 158.0 lb

## 2013-08-01 DIAGNOSIS — Z0000 Encounter for general adult medical examination without abnormal findings: Secondary | ICD-10-CM

## 2013-08-01 DIAGNOSIS — Z113 Encounter for screening for infections with a predominantly sexual mode of transmission: Secondary | ICD-10-CM

## 2013-08-01 DIAGNOSIS — Z01419 Encounter for gynecological examination (general) (routine) without abnormal findings: Secondary | ICD-10-CM

## 2013-08-01 LAB — POCT URINALYSIS DIPSTICK
BILIRUBIN UA: NEGATIVE
Blood, UA: NEGATIVE
GLUCOSE UA: NEGATIVE
Ketones, UA: NEGATIVE
LEUKOCYTES UA: NEGATIVE
Nitrite, UA: NEGATIVE
Protein, UA: NEGATIVE
Urobilinogen, UA: NEGATIVE
pH, UA: 5

## 2013-08-01 LAB — HEMOGLOBIN, FINGERSTICK: HEMOGLOBIN, FINGERSTICK: 14 g/dL (ref 12.0–16.0)

## 2013-08-01 MED ORDER — NORELGESTROMIN-ETH ESTRADIOL 150-35 MCG/24HR TD PTWK: 1.0000 | MEDICATED_PATCH | TRANSDERMAL | Status: DC

## 2013-08-01 NOTE — Patient Instructions (Signed)

## 2013-08-01 NOTE — Progress Notes (Signed)
Patient ID: Wendy Molina, female   DOB: 11/09/1985, 28 y.o.   MRN: [id number] 28 y.o. G0P0 Single Caucasian Fe here for annual exam.  Menses lasting for 5 days.  Moderate to light.  Not dating or SA.  No vaginitis symptoms.  [facility] and was negative 05/19/13, wants to have rest of STD's today.   Patient's last menstrual period was 07/25/2013.          Sexually active: Yes.    The current method of family planning is OCP (estrogen/progesterone).    Exercising: No.  The patient does not participate in regular exercise at present. Smoker:  no  Health Maintenance: Pap:  03/21/12, WNL TDaP:  06/22/12 Gardasil:  1 injection only 07/2010 Labs: HB:  14.0    Urine:  Negative    reports that she has never smoked. She has never used smokeless tobacco. She reports that she drinks about one ounce of alcohol per week. She reports that she does not use illicit drugs.  Past Medical History  Diagnosis Date  . Hyperlipidemia   . Headache(784.0)     migraine w/o aura    History reviewed. No pertinent past surgical history.  Current Outpatient Prescriptions  Medication Sig Dispense Refill  . folic acid (FOLVITE) 1 MG tablet Take 1 mg by mouth daily.      . Multiple Vitamin (MULTIVITAMIN) tablet Take 1 tablet by mouth daily.      . norelgestromin-ethinyl estradiol (ORTHO EVRA) 150-35 MCG/24HR transdermal patch Place 1 patch onto the skin once a week.  3 Package  3   No current facility-administered medications for this visit.    Family History  Problem Relation Age of Onset  . Arthritis Mother   . Heart disease Maternal Aunt   . Lung cancer Maternal Grandmother   . Leukemia Paternal Grandmother     unsure correct type of cancer    ROS:  Pertinent items are noted in HPI.  Otherwise, a comprehensive ROS was negative.  Exam:   BP 124/82  Pulse 92  Resp 16  Ht 5' 6.5" (1.689 m)  Wt 158 lb (71.668 kg)  BMI 25.12 kg/m2  LMP 07/25/2013 Height: 5' 6.5" (168.9 cm)   Ht Readings from Last 3 Encounters:  08/01/13 5' 6.5" (1.689 m)  06/23/13 5\' 6"  (1.676 m)  05/26/13 5\' 6"  (1.676 m)    General appearance: alert, cooperative and appears stated age Head: Normocephalic, without obvious abnormality, atraumatic Neck: no adenopathy, supple, symmetrical, trachea midline and thyroid normal to inspection and palpation Lungs: clear to auscultation bilaterally Breasts: normal appearance, no masses or tenderness Heart: regular rate and rhythm Abdomen: soft, non-tender; no masses,  no organomegaly Extremities: extremities normal, atraumatic, no cyanosis or edema Skin: Skin color, texture, turgor normal. No rashes or lesions Lymph nodes: Cervical, supraclavicular, and axillary nodes normal. No abnormal inguinal nodes palpated Neurologic: Grossly normal   Pelvic: External genitalia:  no lesions              Urethra:  normal appearing urethra with no masses, tenderness or lesions              Bartholin's and Skene's: normal                 Vagina: normal appearing vagina with normal color and discharge, no lesions              Cervix: cervical motion tenderness  Pap taken: Yes.   Bimanual Exam:  Uterus:  normal size, contour, position, consistency, mobility, non-tender              Adnexa: no mass, fullness, tenderness               Rectovaginal: Confirms               Anus:  normal sphincter tone, no lesions  A:  Well Woman with normal exam  Contraception with Ortho Evra  R/LO STD's  P:   Reviewed health and wellness pertinent to exam  Pap smear taken today  Refilled Ortho Evra for a year - she may consider other options and is given information about Skyla IUD  Will follow with labs  Counseled on breast self exam, use and side effects of OCP's, adequate intake of calcium and vitamin D, diet and exercise return annually or prn  An After Visit Summary was printed and given to the patient.

## 2013-08-02 LAB — STD PANEL
HIV 1&2 Ab, 4th Generation: NONREACTIVE
Hepatitis B Surface Ag: NEGATIVE

## 2013-08-02 LAB — IPS PAP TEST WITH REFLEX TO HPV

## 2013-08-02 NOTE — Progress Notes (Signed)
Encounter reviewed by Dr. Brook Silva.  

## 2013-08-03 ENCOUNTER — Telehealth: Payer: Self-pay | Admitting: Emergency Medicine

## 2013-08-03 MED ORDER — NORELGESTROMIN-ETH ESTRADIOL 150-35 MCG/24HR TD PTWK: 1.0000 | MEDICATED_PATCH | TRANSDERMAL | Status: DC

## 2013-08-03 NOTE — Telephone Encounter (Signed)
Incoming call from [facility]. States that patient's insurance will not cover brand rx at all.  Covers generic with zero copay.  Would like to know if patient would like generic.   Spoke with patient. She was told that brand was not available any longer and she is agreeable to generic patch. Does not have any issues with generic patch. Order resent with generic rx.  Routing to provider for final review. Patient agreeable to disposition. Will close encounter

## 2013-08-15 ENCOUNTER — Encounter: Payer: Self-pay | Admitting: Family

## 2013-08-15 ENCOUNTER — Ambulatory Visit (INDEPENDENT_AMBULATORY_CARE_PROVIDER_SITE_OTHER): Payer: Managed Care, Other (non HMO) | Admitting: Family

## 2013-08-15 ENCOUNTER — Ambulatory Visit (INDEPENDENT_AMBULATORY_CARE_PROVIDER_SITE_OTHER)
Admission: RE | Admit: 2013-08-15 | Discharge: 2013-08-15 | Disposition: A | Discharge status: Home / Self Care | Payer: Managed Care, Other (non HMO) | Source: Ambulatory Visit | Attending: Family | Admitting: Family

## 2013-08-15 VITALS — BP 102/62 | HR 83 | Wt 165.0 lb

## 2013-08-15 DIAGNOSIS — M25579 Pain in unspecified ankle and joints of unspecified foot: Secondary | ICD-10-CM

## 2013-08-15 DIAGNOSIS — M25572 Pain in left ankle and joints of left foot: Secondary | ICD-10-CM

## 2013-08-15 IMAGING — CR DG FOOT COMPLETE 3+V*L*
3 series · 3 of 3 positions shown · non-contrast
Comparison: None.

CLINICAL DATA: 27-year-old female status post blunt trauma 1 month
ago with pain. Initial encounter.

EXAM:
LEFT FOOT - COMPLETE 3+ VIEW

[view not recorded (1 of 3)]
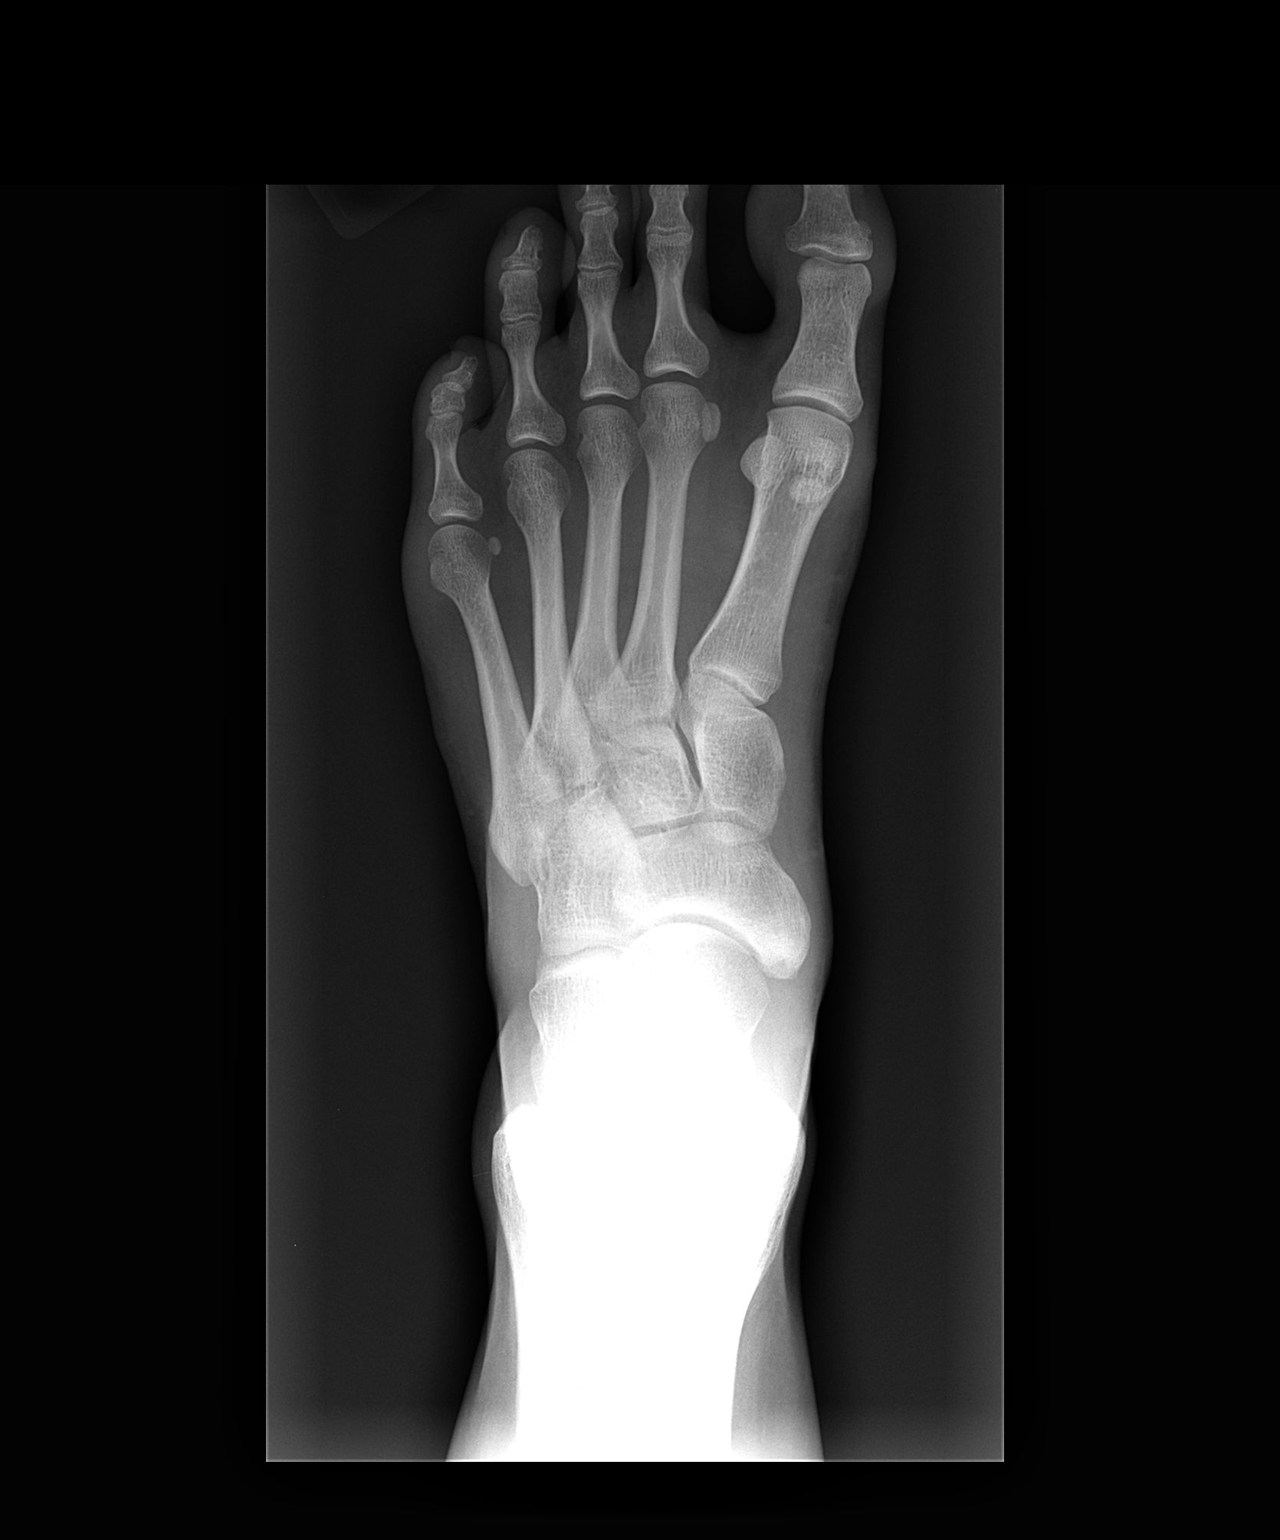

[view not recorded (2 of 3)]
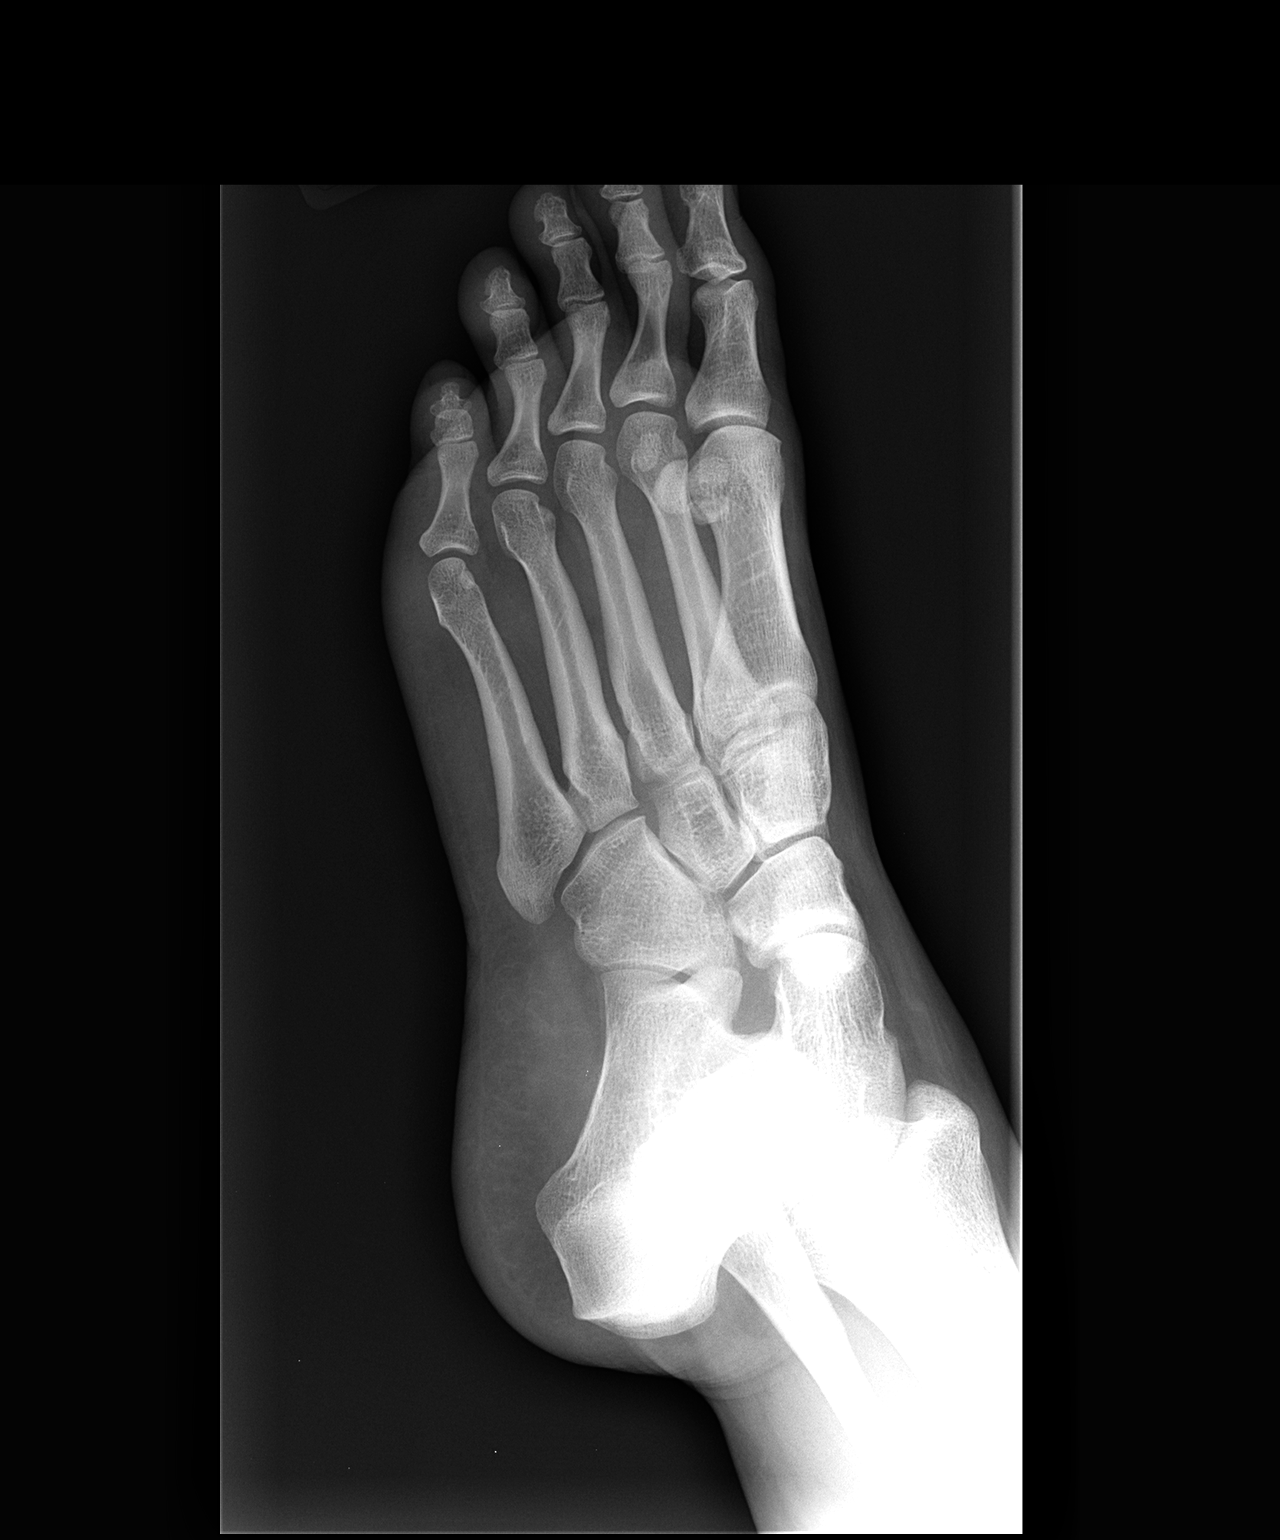

[view not recorded (3 of 3)]
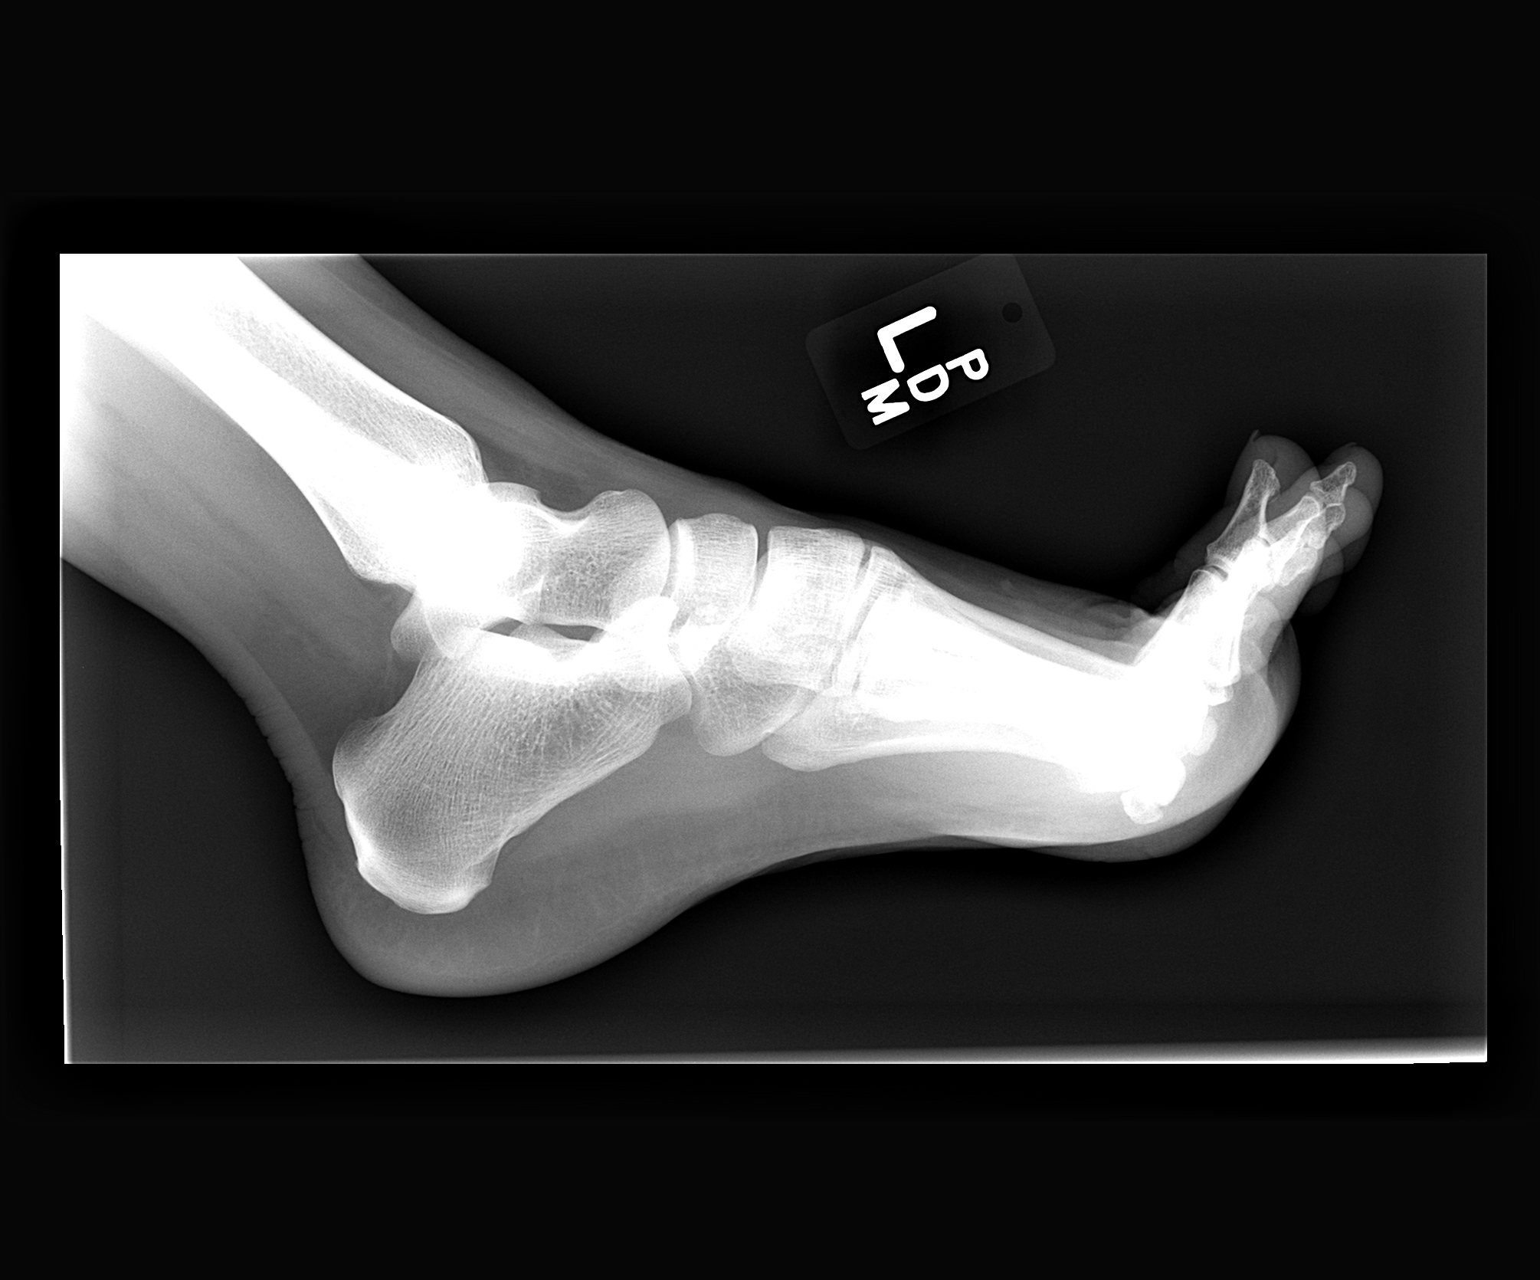

[3 of 3 positions shown; findings below may reference images not displayed]

FINDINGS: Bone mineralization is within normal limits. Calcaneus intact. Joint
spaces and alignment preserved. No fracture or dislocation
identified. Soft tissues within normal limits.
IMPRESSION: Negative.

## 2013-08-15 MED ORDER — DICLOFENAC SODIUM 75 MG PO TBEC: 75.0000 mg | DELAYED_RELEASE_TABLET | Freq: Two times a day (BID) | ORAL | Status: DC

## 2013-08-15 NOTE — Progress Notes (Signed)
Pre visit review using our clinic review tool, if applicable. No additional management support is needed unless otherwise documented below in the visit note. 

## 2013-08-15 NOTE — Patient Instructions (Signed)
Please get xray today. Then will call you on results within 24-48 hrs. Take diclofenac for pain and inflamation. Stop otc nsaids. Follow up if pain persists or worsens.

## 2013-08-15 NOTE — Progress Notes (Signed)
   Subjective:    Patient ID: Wendy Molina, female    DOB: 05/28/1985, 28 y.o.   MRN: [id number]  HPI  Pt in states she thinks she stubbed her toe 4 wks ago. Pt states pain on pressure intermittently. Feels sore and sometimes throbs and feel stabbing. All shoes irritate area even flip flops. Pt taken some motrin. Nothing around the clock.  7-8 level pain initialy. Now level 5.  No insect bite.  LMP- June 30th.    Review of Systems  Constitutional: Negative.   Respiratory: Negative.   Cardiovascular: Negative.   Musculoskeletal: Positive for arthralgias.       Left toe pain  Skin: Negative.   Neurological: Negative.   Psychiatric/Behavioral: Negative.    Past Medical History  Diagnosis Date  . Hyperlipidemia   . Headache(784.0)     migraine w/o aura    History   Social History  . Marital Status: Single    Spouse Name: N/A    Number of Children: 0  . Years of Education: N/A   Occupational History  . Not on file.   Social History Main Topics  . Smoking status: Never Smoker   . Smokeless tobacco: Never Used  . Alcohol Use: 1.0 oz/week    2 drink(s) per week  . Drug Use: No  . Sexual Activity: Yes    Partners: Male    Birth Control/ Protection: Pill   Other Topics Concern  . Not on file   Social History Narrative  . No narrative on file    History reviewed. No pertinent past surgical history.  Family History  Problem Relation Age of Onset  . Arthritis Mother   . Heart disease Maternal Aunt   . Lung cancer Maternal Grandmother   . Leukemia Paternal Grandmother     unsure correct type of cancer    No Known Allergies  Current Outpatient Prescriptions on File Prior to Visit  Medication Sig Dispense Refill  . folic acid (FOLVITE) 1 MG tablet Take 1 mg by mouth daily.      . Multiple Vitamin (MULTIVITAMIN) tablet Take 1 tablet by mouth daily.      . norelgestromin-ethinyl estradiol (ORTHO EVRA) 150-35 MCG/24HR transdermal patch Place 1 patch  onto the skin once a week.  3 Package  3   No current facility-administered medications on file prior to visit.    BP 102/62  Pulse 83  Wt 165 lb (74.844 kg)  LMP 06/30/2015chart    Objective:   Physical Exam  Constitutional: She appears well-developed and well-nourished.  Neck: Neck supple.  Cardiovascular: Normal rate, regular rhythm and normal heart sounds.   Pulmonary/Chest: Effort normal and breath sounds normal.  Musculoskeletal: She exhibits edema and tenderness.  Left 5th digit tender to palpation. Mild swelling. No obvious deformity.   Neurological: She is alert.  Skin: Skin is warm and dry.  Psychiatric: She has a normal mood and affect.    Lt foot- no bruise. Other region of foot not tender. Lt 5th toe joint and  distal fifth metatarsal mild to moderate tender.  . Assessment & Plan:  Wendy Molina was seen today for toe pain.  Diagnoses and associated orders for this visit:  Pain in joint, ankle and foot, left - DG Foot Complete Left; Future  Other Orders - diclofenac (VOLTAREN) 75 MG EC tablet; Take 1 tablet (75 mg total) by mouth 2 (two) times daily.

## 2013-09-07 ENCOUNTER — Other Ambulatory Visit (INDEPENDENT_AMBULATORY_CARE_PROVIDER_SITE_OTHER): Payer: Managed Care, Other (non HMO)

## 2013-09-07 DIAGNOSIS — Z0000 Encounter for general adult medical examination without abnormal findings: Secondary | ICD-10-CM

## 2013-09-07 LAB — CBC WITH DIFFERENTIAL/PLATELET
Basophils Absolute: 0 10*3/uL (ref 0.0–0.1)
Basophils Relative: 0.5 % (ref 0.0–3.0)
Eosinophils Absolute: 0.1 10*3/uL (ref 0.0–0.7)
Eosinophils Relative: 0.7 % (ref 0.0–5.0)
HCT: 39.3 % (ref 36.0–46.0)
Hemoglobin: 13.3 g/dL (ref 12.0–15.0)
LYMPHS PCT: 36.2 % (ref 12.0–46.0)
Lymphs Abs: 2.6 10*3/uL (ref 0.7–4.0)
MCHC: 33.8 g/dL (ref 30.0–36.0)
MCV: 89.7 fl (ref 78.0–100.0)
MONOS PCT: 8.2 % (ref 3.0–12.0)
Monocytes Absolute: 0.6 10*3/uL (ref 0.1–1.0)
Neutro Abs: 3.9 10*3/uL (ref 1.4–7.7)
Neutrophils Relative %: 54.4 % (ref 43.0–77.0)
Platelets: 246 10*3/uL (ref 150.0–400.0)
RBC: 4.38 Mil/uL (ref 3.87–5.11)
RDW: 14.1 % (ref 11.5–15.5)
WBC: 7.2 10*3/uL (ref 4.0–10.5)

## 2013-09-07 LAB — LIPID PANEL
CHOLESTEROL: 153 mg/dL (ref 0–200)
HDL: 55.1 mg/dL (ref >39.00)
LDL Cholesterol: 73 mg/dL (ref 0–99)
NonHDL: 97.9
Total CHOL/HDL Ratio: 3
Triglycerides: 124 mg/dL (ref 0.0–149.0)
VLDL: 24.8 mg/dL (ref 0.0–40.0)

## 2013-09-07 LAB — BASIC METABOLIC PANEL
BUN: 13 mg/dL (ref 6–23)
CO2: 23 mEq/L (ref 19–32)
Calcium: 8.7 mg/dL (ref 8.4–10.5)
Chloride: 107 mEq/L (ref 96–112)
Creatinine, Ser: 0.6 mg/dL (ref 0.4–1.2)
GFR: 118.87 mL/min (ref >60.00)
GLUCOSE: 77 mg/dL (ref 70–99)
Potassium: 4 mEq/L (ref 3.5–5.1)
Sodium: 137 mEq/L (ref 135–145)

## 2013-09-07 LAB — HEPATIC FUNCTION PANEL
ALK PHOS: 45 U/L (ref 39–117)
ALT: 12 U/L (ref 0–35)
AST: 18 U/L (ref 0–37)
Albumin: 3.5 g/dL (ref 3.5–5.2)
BILIRUBIN DIRECT: 0 mg/dL (ref 0.0–0.3)
BILIRUBIN TOTAL: 0.6 mg/dL (ref 0.2–1.2)
TOTAL PROTEIN: 6.5 g/dL (ref 6.0–8.3)

## 2013-09-07 LAB — POCT URINALYSIS DIPSTICK
BILIRUBIN UA: NEGATIVE
Blood, UA: NEGATIVE
Glucose, UA: NEGATIVE
KETONES UA: NEGATIVE
Nitrite, UA: NEGATIVE
PH UA: 7.5
Spec Grav, UA: 1.02
Urobilinogen, UA: 0.2

## 2013-09-07 LAB — TSH: TSH: 0.57 u[IU]/mL (ref 0.35–4.50)

## 2013-09-12 ENCOUNTER — Ambulatory Visit (INDEPENDENT_AMBULATORY_CARE_PROVIDER_SITE_OTHER): Payer: Managed Care, Other (non HMO) | Admitting: Family

## 2013-09-12 ENCOUNTER — Encounter: Payer: Self-pay | Admitting: Family

## 2013-09-12 VITALS — BP 104/76 | HR 89 | Ht 66.5 in | Wt 164.0 lb

## 2013-09-12 DIAGNOSIS — Z0000 Encounter for general adult medical examination without abnormal findings: Secondary | ICD-10-CM

## 2013-09-12 NOTE — Progress Notes (Signed)
Pre visit review using our clinic review tool, if applicable. No additional management support is needed unless otherwise documented below in the visit note. 

## 2013-09-12 NOTE — Progress Notes (Signed)
Subjective:    Patient ID: Wendy Molina, female    DOB: 04/01/1985, 28 y.o.   MRN: [id number]  HPI 28 year old white female, nonsmoker is in today for complete physical exam. See gynecology. Denies any concerns. Has not been exercising as she had been in the past.     Review of Systems  Constitutional: Negative.   HENT: Negative.   Eyes: Negative.   Respiratory: Negative.   Cardiovascular: Negative.   Gastrointestinal: Negative.   Endocrine: Negative.   Genitourinary: Negative.   Musculoskeletal: Negative.   Skin: Negative.   Allergic/Immunologic: Negative.   Neurological: Negative.   Hematological: Negative.   Psychiatric/Behavioral: Negative.    Past Medical History  Diagnosis Date  . Hyperlipidemia   . Headache(784.0)     migraine w/o aura    History   Social History  . Marital Status: Single    Spouse Name: N/A    Number of Children: 0  . Years of Education: N/A   Occupational History  . Not on file.   Social History Main Topics  . Smoking status: Never Smoker   . Smokeless tobacco: Never Used  . Alcohol Use: 1.0 oz/week    2 drink(s) per week  . Drug Use: No  . Sexual Activity: Yes    Partners: Male    Birth Control/ Protection: Pill   Other Topics Concern  . Not on file   Social History Narrative  . No narrative on file    History reviewed. No pertinent past surgical history.  Family History  Problem Relation Age of Onset  . Arthritis Mother   . Heart disease Maternal Aunt   . Lung cancer Maternal Grandmother   . Leukemia Paternal Grandmother     unsure correct type of cancer    No Known Allergies  Current Outpatient Prescriptions on File Prior to Visit  Medication Sig Dispense Refill  . diclofenac (VOLTAREN) 75 MG EC tablet Take 1 tablet (75 mg total) by mouth 2 (two) times daily.  30 tablet  0  . folic acid (FOLVITE) 1 MG tablet Take 1 mg by mouth daily.      . Multiple Vitamin (MULTIVITAMIN) tablet Take 1 tablet by mouth  daily.      . norelgestromin-ethinyl estradiol (ORTHO EVRA) 150-35 MCG/24HR transdermal patch Place 1 patch onto the skin once a week.  3 Package  3   No current facility-administered medications on file prior to visit.    BP 104/76  Pulse 89  Ht 5' 6.5" (1.689 m)  Wt 164 lb (74.39 kg)  BMI 26.08 kg/m2chart     Objective:   Physical Exam  Constitutional: She is oriented to person, place, and time. She appears well-developed and well-nourished.  HENT:  Head: Normocephalic and atraumatic.  Right Ear: External ear normal.  Left Ear: External ear normal.  Nose: Nose normal.  Mouth/Throat: Oropharynx is clear and moist.  Eyes: Conjunctivae and EOM are normal. Pupils are equal, round, and reactive to light.  Neck: Normal range of motion. Neck supple. No thyromegaly present.  Cardiovascular: Normal rate, regular rhythm and normal heart sounds.   Pulmonary/Chest: Effort normal and breath sounds normal.  Abdominal: Soft. Bowel sounds are normal.  Musculoskeletal: Normal range of motion.  Neurological: She is alert and oriented to person, place, and time. She has normal reflexes.  Skin: Skin is warm and dry.  Psychiatric: She has a normal mood and affect.          Assessment & Plan:  Adaora was seen today for annual exam.  Diagnoses and associated orders for this visit:  Preventative health care    encouraged healthy diet, exercise, monthly self breast exams. Recheck in one year sooner as needed.

## 2013-09-12 NOTE — Patient Instructions (Signed)
Exercise to Stay Healthy Exercise helps you become and stay healthy. EXERCISE IDEAS AND TIPS Choose exercises that:  You enjoy.  Fit into your day. You do not need to exercise really hard to be healthy. You can do exercises at a slow or medium level and stay healthy. You can:  Stretch before and after working out.  Try yoga, Pilates, or tai chi.  Lift weights.  Walk fast, swim, jog, run, climb stairs, bicycle, dance, or rollerskate.  Take aerobic classes. Exercises that burn about 150 calories:  Running 1  miles in 15 minutes.  Playing volleyball for 45 to 60 minutes.  Washing and waxing a car for 45 to 60 minutes.  Playing touch football for 45 minutes.  Walking 1  miles in 35 minutes.  Pushing a stroller 1  miles in 30 minutes.  Playing basketball for 30 minutes.  Raking leaves for 30 minutes.  Bicycling 5 miles in 30 minutes.  Walking 2 miles in 30 minutes.  Dancing for 30 minutes.  Shoveling snow for 15 minutes.  Swimming laps for 20 minutes.  Walking up stairs for 15 minutes.  Bicycling 4 miles in 15 minutes.  Gardening for 30 to 45 minutes.  Jumping rope for 15 minutes.  Washing windows or floors for 45 to 60 minutes. Document Released: 02/14/2010 Document Revised: 04/06/2011 Document Reviewed: 02/14/2010 ExitCare Patient Information 2015 [employer]. This information is not intended to replace advice given to you by your health care provider. Make sure you discuss any questions you have with your health care provider.  

## 2013-11-20 ENCOUNTER — Encounter: Payer: Self-pay | Admitting: Nurse Practitioner

## 2013-11-20 ENCOUNTER — Ambulatory Visit (INDEPENDENT_AMBULATORY_CARE_PROVIDER_SITE_OTHER): Payer: Managed Care, Other (non HMO) | Admitting: Nurse Practitioner

## 2013-11-20 VITALS — BP 130/80 | HR 80 | Resp 18 | Ht 66.5 in | Wt 167.0 lb

## 2013-11-20 DIAGNOSIS — Z3009 Encounter for other general counseling and advice on contraception: Secondary | ICD-10-CM

## 2013-11-20 NOTE — Progress Notes (Signed)
Patient ID: Wendy BollKatherine L Molina, female   DOB: 08/29/1985, 28 y.o.   MRN: [id number] S: This 28 yo G0P0. Comes in for a consult to discuss other methods of birth control.  She is currently on Ortho Evra patch and has done well until now.  She has been given the generic patches.  Menses still last 4-5 days but she is having BTB because the patches come off frequently. With the generic patch she also has an increase in acne, appetite and PMS. LMP 11/08/13. Currently not SA. At her [facility] in July she expressed an interest in [place] I[place] and wants to proceed.  She does not want OCP , Nuva Ring, or Nexplanon.  A: Contraception   Plan: Discussed Skyla IUD with information given to review  She will plan a consult visit with Dr. Edward JollySilva to discus and proceed  Order is placed to get insurance information  Consult time: 15 minutes

## 2013-11-20 NOTE — Patient Instructions (Signed)
Levonorgestrel intrauterine device (IUD) What is this medicine? LEVONORGESTREL IUD (LEE voe nor jes trel) is a contraceptive (birth control) device. The device is placed inside the uterus by a healthcare professional. It is used to prevent pregnancy and can also be used to treat heavy bleeding that occurs during your period. Depending on the device, it can be used for 3 to 5 years. This medicine may be used for other purposes; ask your health care provider or pharmacist if you have questions. COMMON BRAND NAME(S): LILETTA, Mirena, Skyla What should I tell my health care provider before I take this medicine? They need to know if you have any of these conditions: -abnormal Pap smear -cancer of the breast, uterus, or cervix -diabetes -endometritis -genital or pelvic infection now or in the past -have more than one sexual partner or your partner has more than one partner -heart disease -history of an ectopic or tubal pregnancy -immune system problems -IUD in place -liver disease or tumor -problems with blood clots or take blood-thinners -use intravenous drugs -uterus of unusual shape -vaginal bleeding that has not been explained -an unusual or allergic reaction to levonorgestrel, other hormones, silicone, or polyethylene, medicines, foods, dyes, or preservatives -pregnant or trying to get pregnant -breast-feeding How should I use this medicine? This device is placed inside the uterus by a health care professional. Talk to your pediatrician regarding the use of this medicine in children. Special care may be needed. Overdosage: If you think you have taken too much of this medicine contact a poison control center or emergency room at once. NOTE: This medicine is only for you. Do not share this medicine with others. What if I miss a dose? This does not apply. What may interact with this medicine? Do not take this medicine with any of the following  medications: -amprenavir -bosentan -fosamprenavir This medicine may also interact with the following medications: -aprepitant -barbiturate medicines for inducing sleep or treating seizures -bexarotene -griseofulvin -medicines to treat seizures like carbamazepine, ethotoin, felbamate, oxcarbazepine, phenytoin, topiramate -modafinil -pioglitazone -rifabutin -rifampin -rifapentine -some medicines to treat HIV infection like atazanavir, indinavir, lopinavir, nelfinavir, tipranavir, ritonavir -St. John's wort -warfarin This list may not describe all possible interactions. Give your health care provider a list of all the medicines, herbs, non-prescription drugs, or dietary supplements you use. Also tell them if you smoke, drink alcohol, or use illegal drugs. Some items may interact with your medicine. What should I watch for while using this medicine? Visit your doctor or health care professional for regular check ups. See your doctor if you or your partner has sexual contact with others, becomes HIV positive, or gets a sexual transmitted disease. This product does not protect you against HIV infection (AIDS) or other sexually transmitted diseases. You can check the placement of the IUD yourself by reaching up to the top of your vagina with clean fingers to feel the threads. Do not pull on the threads. It is a good habit to check placement after each menstrual period. Call your doctor right away if you feel more of the IUD than just the threads or if you cannot feel the threads at all. The IUD may come out by itself. You may become pregnant if the device comes out. If you notice that the IUD has come out use a backup birth control method like condoms and call your health care provider. Using tampons will not change the position of the IUD and are okay to use during your period. What side effects may   I notice from receiving this medicine? Side effects that you should report to your doctor or  health care professional as soon as possible: -allergic reactions like skin rash, itching or hives, swelling of the face, lips, or tongue -fever, flu-like symptoms -genital sores -high blood pressure -no menstrual period for 6 weeks during use -pain, swelling, warmth in the leg -pelvic pain or tenderness -severe or sudden headache -signs of pregnancy -stomach cramping -sudden shortness of breath -trouble with balance, talking, or walking -unusual vaginal bleeding, discharge -yellowing of the eyes or skin Side effects that usually do not require medical attention (report to your doctor or health care professional if they continue or are bothersome): -acne -breast pain -change in sex drive or performance -changes in weight -cramping, dizziness, or faintness while the device is being inserted -headache -irregular menstrual bleeding within first 3 to 6 months of use -nausea This list may not describe all possible side effects. Call your doctor for medical advice about side effects. You may report side effects to FDA at 1-800-FDA-1088. Where should I keep my medicine? This does not apply. NOTE: This sheet is a summary. It may not cover all possible information. If you have questions about this medicine, talk to your doctor, pharmacist, or health care provider.  2015, Elsevier/Gold Standard. (2011-02-12 13:54:04)  

## 2013-11-22 NOTE — Progress Notes (Signed)
Encounter reviewed by Dr. Brook Silva.  

## 2013-11-23 ENCOUNTER — Telehealth: Payer: Self-pay | Admitting: Nurse Practitioner

## 2013-11-23 NOTE — Telephone Encounter (Signed)
Spoke with patient. Advised that per benefits quote received, IUD and insertion is covered at 100%. There will be 0 patient liability. Patient is to call within the first 5 days of her cycle to schedule insertion once she has had her consult and decided whether or not she wants to proceed with IUD.

## 2013-11-23 NOTE — Telephone Encounter (Signed)
Pt is calling Sabrina back

## 2013-11-23 NOTE — Telephone Encounter (Signed)
Left message for patient to call back. Need to go over contraception benefits.  °Pr $0 °

## 2013-12-07 ENCOUNTER — Encounter: Payer: Self-pay | Admitting: Obstetrics and Gynecology

## 2013-12-07 ENCOUNTER — Ambulatory Visit (INDEPENDENT_AMBULATORY_CARE_PROVIDER_SITE_OTHER): Payer: Managed Care, Other (non HMO) | Admitting: Obstetrics and Gynecology

## 2013-12-07 VITALS — BP 104/66 | HR 72 | Ht 66.5 in | Wt 168.0 lb

## 2013-12-07 DIAGNOSIS — Z3009 Encounter for other general counseling and advice on contraception: Secondary | ICD-10-CM

## 2013-12-07 DIAGNOSIS — Z113 Encounter for screening for infections with a predominantly sexual mode of transmission: Secondary | ICD-10-CM

## 2013-12-07 MED ORDER — TERCONAZOLE 0.4 % VA CREA: 1.0000 | TOPICAL_CREAM | Freq: Every day | VAGINAL | Status: DC

## 2013-12-07 NOTE — Patient Instructions (Signed)
Call with your next menstruation so we can schedule the IUD.

## 2013-12-07 NOTE — Progress Notes (Signed)
GYNECOLOGY  VISIT   HPI: 28 y.o.   Single  Caucasian  female   G0P0 with Patient's last menstrual period was 11/18/2013.   here for   Consult on Skyla IUD On generic Ortho Evra. Patient having problems with patch falling off.  Needs contraception.  Used pills in the past and forgot them.   Some vaginal itching. Used Metrogel 3 -4 days ago.   Had unused Rx at home.   New partner since last STD testing in April 2015.   GYNECOLOGIC HISTORY: Patient's last menstrual period was 11/18/2013. Contraception:   Ortho Evra Patch Menopausal hormone therapy:no         OB History    Gravida Para Term Preterm AB TAB SAB Ectopic Multiple Living   0                  There are no active problems to display for this patient.   Past Medical History  Diagnosis Date  . Hyperlipidemia   . Headache(784.0)     migraine w/o aura    History reviewed. No pertinent past surgical history.  Current Outpatient Prescriptions  Medication Sig Dispense Refill  . folic acid (FOLVITE) 1 MG tablet Take 1 mg by mouth daily.    . Multiple Vitamin (MULTIVITAMIN) tablet Take 1 tablet by mouth daily.    . norelgestromin-ethinyl estradiol (ORTHO EVRA) 150-35 MCG/24HR transdermal patch Place 1 patch onto the skin once a week. 3 Package 3   No current facility-administered medications for this visit.     ALLERGIES: Review of patient's allergies indicates no known allergies.  Family History  Problem Relation Age of Onset  . Arthritis Mother   . Heart disease Maternal Aunt   . Lung cancer Maternal Grandmother   . Leukemia Paternal Grandmother     unsure correct type of cancer    History   Social History  . Marital Status: Single    Spouse Name: N/A    Number of Children: 0  . Years of Education: N/A   Occupational History  . Not on file.   Social History Main Topics  . Smoking status: Never Smoker   . Smokeless tobacco: Never Used  . Alcohol Use: 1.0 oz/week    2 drink(s) per week  . Drug  Use: No  . Sexual Activity:    Partners: Male    Birth Control/ Protection: Patch   Other Topics Concern  . Not on file   Social History Narrative    ROS:  Pertinent items are noted in HPI.  PHYSICAL EXAMINATION:    BP 104/66 mmHg  Pulse 72  Ht 5' 6.5" (1.689 m)  Wt 168 lb (76.204 kg)  BMI 26.71 kg/m2  LMP 11/18/2013     General appearance: alert, cooperative and appears stated age   Pelvic: External genitalia:  no lesions.  Vulva with clumpy white discharge.              Urethra:  normal appearing urethra with no masses, tenderness or lesions              Bartholins and Skenes: normal                 Vagina: normal appearing vagina with normal color and discharge, no lesions              Cervix: normal appearance.  Clumpy white discharge in the vagina.  Bimanual Exam:  Uterus:  uterus is normal size, shape, consistency and nontender                                      Adnexa: normal adnexa in size, nontender and no masses                                     ASSESSMENT  Desire for long acting contraception.  Need for STD testing.  Yeast vaginitis.   PLAN  Discussed Skyla IUD, ParaGard, Nexplanon, and Depo Provera.  Discussion focused on Skyla IUD.  Risks and beneftis reviewed.  Has handout.  STD check today.  Terazol 7.  Can use for only 3 days and keep the rest for future or to complete treatment if symptoms do not resolve.  Return for IUD placement with menses.    An After Visit Summary was printed and given to the patient.  ___15___ minutes face to face time of which over 50% was spent in counseling.

## 2013-12-08 LAB — STD PANEL
HEP B S AG: NEGATIVE
HIV 1&2 Ab, 4th Generation: NONREACTIVE

## 2013-12-08 LAB — GC/CHLAMYDIA PROBE AMP, URINE
Chlamydia, Swab/Urine, PCR: NEGATIVE
GC Probe Amp, Urine: NEGATIVE

## 2013-12-08 LAB — HEPATITIS C ANTIBODY: HCV AB: NEGATIVE

## 2013-12-29 ENCOUNTER — Telehealth: Payer: Self-pay | Admitting: Nurse Practitioner

## 2013-12-29 MED ORDER — MISOPROSTOL 200 MCG PO TABS: ORAL_TABLET | ORAL | Status: DC

## 2013-12-29 NOTE — Telephone Encounter (Signed)
Spoke with patient. Patient started cycle today and would like to schedule IUD insertion. Appointment scheduled for Monday 12/7 at 11:30am with Dr.Silva. Patient is agreeable to date and time. Pre procedure instructions given.  Motrin instructions given. Motrin=Advil=Ibuprofen, 800 mg one hour before appointment. Eat a meal and hydrate well before appointment. Cytotec instructions given. Take one tablet the night before procedure and one tablet the morning of procedure. Cytotec 200 mcg #2 0RF sent to pharmacy on file. Patient is agreeable.  Routing to provider for final review. Patient agreeable to disposition. Will close encounter

## 2013-12-29 NOTE — Telephone Encounter (Signed)
Left message to call Kaitlyn at [phone]. 

## 2013-12-29 NOTE — Telephone Encounter (Signed)
Patient was to call when her cycle starts for IUD insertion.

## 2014-01-01 ENCOUNTER — Ambulatory Visit (INDEPENDENT_AMBULATORY_CARE_PROVIDER_SITE_OTHER): Payer: Managed Care, Other (non HMO) | Admitting: Obstetrics and Gynecology

## 2014-01-01 ENCOUNTER — Encounter: Payer: Self-pay | Admitting: Obstetrics and Gynecology

## 2014-01-01 VITALS — BP 128/82 | HR 72 | Ht 66.5 in | Wt 168.0 lb

## 2014-01-01 DIAGNOSIS — Z30430 Encounter for insertion of intrauterine contraceptive device: Secondary | ICD-10-CM

## 2014-01-01 NOTE — Progress Notes (Signed)
Subjective:     Patient ID: Wendy Molina, female   DOB: 03/09/1985, 28 y.o.   MRN: [id number]  HPI  Here for IUD insertion of Skyla. Took Cytotect last hs and this am.  Took Ibuprofen 600 mg in preparation for visit.   LMP - 12/29/13. Contraception - Ortho Evra, generic.  Took patch off on 12/25/13.  GC/CT negative with last visit.  Review of Systems See HPI.    Objective:   Physical Exam  Uterus slightly retroverted, small, and nontender.  No adnexal masses or tenderness.     Skyla insertion - lot number [id number], expiration - 02/17 Consent for procedure.  Speculum placed.  Sterile prep of cervix with Hibiclens.  Tenaculum to anterior cervical lip.  Paracervical block with 10 cc 15 lidocaine - 06/27/14. Uterus sounded to 7 cm.  Skyla placed without difficulty.  Strings trimmed.  Repeat pelvic exam - unchanged.  Minimal EBL.  No complications.   Assessment:     Skyla IUD placement.     Plan:     Instructions and precautions given. Follow up in 4 - 5 weeks.   After visit summary to patient.   15 minutes face to face time of which over 50 % was spent in counseling.

## 2014-01-01 NOTE — Patient Instructions (Signed)

## 2014-01-26 DIAGNOSIS — R51 Headache: Secondary | ICD-10-CM

## 2014-01-26 HISTORY — DX: Headache: R51

## 2014-02-05 ENCOUNTER — Encounter: Payer: Self-pay | Admitting: Obstetrics and Gynecology

## 2014-02-05 ENCOUNTER — Ambulatory Visit (INDEPENDENT_AMBULATORY_CARE_PROVIDER_SITE_OTHER): Payer: BLUE CROSS/BLUE SHIELD | Admitting: Obstetrics and Gynecology

## 2014-02-05 VITALS — BP 120/72 | HR 68 | Resp 16 | Ht 66.5 in | Wt 173.0 lb

## 2014-02-05 DIAGNOSIS — Z30431 Encounter for routine checking of intrauterine contraceptive device: Secondary | ICD-10-CM

## 2014-02-05 NOTE — Progress Notes (Signed)
GYNECOLOGY  VISIT   HPI: 29 y.o.   Single  Caucasian  female   G0P0 with No LMP recorded. Patient is not currently having periods (Reason: IUD).   here for   iud recheck IUD placed 01/01/14.   Some cramping.  Intense cramping a week ago and with insertion for a few days.  Ibuprofen and a hot bath helped.  Pain was manageable.   Bled for 1 1/2 days since insertion.   No problems with the string length.   GYNECOLOGIC HISTORY: No LMP recorded. Patient is not currently having periods (Reason: IUD). Contraception:   iud Menopausal hormone therapy:  n/a        OB History    Gravida Para Term Preterm AB TAB SAB Ectopic Multiple Living   0                  There are no active problems to display for this patient.   Past Medical History  Diagnosis Date  . Hyperlipidemia   . Headache(784.0)     migraine w/o aura    History reviewed. No pertinent past surgical history.  Current Outpatient Prescriptions  Medication Sig Dispense Refill  . folic acid (FOLVITE) 1 MG tablet Take 1 mg by mouth daily.    . Levonorgestrel (SKYLA) 13.5 MG IUD by Intrauterine route.    . Multiple Vitamin (MULTIVITAMIN) tablet Take 1 tablet by mouth daily.     No current facility-administered medications for this visit.     ALLERGIES: Review of patient's allergies indicates no known allergies.  Family History  Problem Relation Age of Onset  . Arthritis Mother   . Heart disease Maternal Aunt   . Lung cancer Maternal Grandmother   . Leukemia Paternal Grandmother     unsure correct type of cancer    History   Social History  . Marital Status: Single    Spouse Name: N/A    Number of Children: 0  . Years of Education: N/A   Occupational History  . Not on file.   Social History Main Topics  . Smoking status: Never Smoker   . Smokeless tobacco: Never Used  . Alcohol Use: 1.2 oz/week    2 Not specified per week  . Drug Use: No  . Sexual Activity:    Partners: Male    Birth Control/  Protection: IUD     Comment: skyla   Other Topics Concern  . Not on file   Social History Narrative    ROS:  Pertinent items are noted in HPI.  PHYSICAL EXAMINATION:    BP 120/72 mmHg  Pulse 68  Resp 16  Ht 5' 6.5" (1.689 m)  Wt 173 lb (78.472 kg)  BMI 27.51 kg/m2  LMP      General appearance: alert, cooperative and appears stated age Extremities:  3 cm bruises on left lower extremity - 2. (States she does not know where they came from.)  Pelvic: External genitalia:  no lesions              Urethra:  normal appearing urethra with no masses, tenderness or lesions              Bartholins and Skenes: normal                 Vagina: normal appearing vagina with normal color and discharge, no lesions              Cervix: normal appearance.  IUD strings noted.  Bimanual Exam:  Uterus:  uterus is normal size, shape, consistency and nontender                                      Adnexa: normal adnexa in size, nontender and no masses                                       ASSESSMENT  Skyla IUD patient.  Doing well.   PLAN  Reassured regarding pregnancy protection.  Condoms for STD prevention.  Return for usually GYN exam.     An After Visit Summary was printed and given to the patient.  __15____ minutes face to face time of which over 50% was spent in counseling.

## 2014-04-04 ENCOUNTER — Ambulatory Visit (INDEPENDENT_AMBULATORY_CARE_PROVIDER_SITE_OTHER): Payer: BLUE CROSS/BLUE SHIELD | Admitting: Family Medicine

## 2014-04-04 ENCOUNTER — Encounter: Payer: Self-pay | Admitting: Family Medicine

## 2014-04-04 VITALS — BP 120/80 | HR 85 | Temp 98.0°F | Wt 182.0 lb

## 2014-04-04 DIAGNOSIS — M546 Pain in thoracic spine: Secondary | ICD-10-CM

## 2014-04-04 DIAGNOSIS — M549 Dorsalgia, unspecified: Secondary | ICD-10-CM

## 2014-04-04 MED ORDER — PREDNISONE 10 MG PO TABS: ORAL_TABLET | ORAL | Status: DC

## 2014-04-04 NOTE — Patient Instructions (Signed)
Be in touch in 2 weeks if no better and sooner for any upper extremity weakness or numbness.

## 2014-04-04 NOTE — Progress Notes (Signed)
   Subjective:    Patient ID: Wendy Molina, female    DOB: 12/30/1985, 30 y.o.   MRN: [id number]  HPI Acute visit. Patient seen with left upper back pain. Onset about 2 weeks ago. She describes occasionally sharp and sometimes achy quality of pain with tingling sensation in that region. No rash. Sensation of numbness intermittently. No known injury. No radiation down upper extremity. Pain is relatively mild 6 out of 10 severity at its worst. Ibuprofen helps slightly. No exacerbating features. No chest pains.  Past Medical History  Diagnosis Date  . Hyperlipidemia   . Headache(784.0)     migraine w/o aura   Past Surgical History  Procedure Laterality Date  . Skyla iud      insertion 01-01-14    reports that she has never smoked. She has never used smokeless tobacco. She reports that she drinks about 1.2 oz of alcohol per week. She reports that she does not use illicit drugs. family history includes Arthritis in her mother; Heart disease in her maternal aunt; Leukemia in her paternal grandmother; Lung cancer in her maternal grandmother. No Known Allergies    Review of Systems  Constitutional: Negative for fever, chills, appetite change and unexpected weight change.  Skin: Negative for rash.  Neurological: Negative for weakness.  Hematological: Negative for adenopathy.       Objective:   Physical Exam  Constitutional: She appears well-developed and well-nourished.  Neck: Neck supple.  Cardiovascular: Normal rate and regular rhythm.   Pulmonary/Chest: Effort normal and breath sounds normal. No respiratory distress. She has no wheezes. She has no rales.  Musculoskeletal: She exhibits no edema.  Neck full range of motion. She has some poorly localized tenderness left trapezius.  Neurological:  Full strength upper extremities. Symmetric reflexes. Normal sensory function to touch.          Assessment & Plan:  Patient presents with left upper back pain with some associated  tingling. Question cervical nerve impingement. No radiculopathy symptoms. No neurologic findings. Prednisone taper over the next week. Touch base if not resolving with that. Consider neck films if no better in 2 weeks

## 2014-04-04 NOTE — Progress Notes (Signed)
Pre visit review using our clinic review tool, if applicable. No additional management support is needed unless otherwise documented below in the visit note. 

## 2014-04-06 ENCOUNTER — Encounter: Payer: Self-pay | Admitting: Obstetrics and Gynecology

## 2014-04-06 ENCOUNTER — Telehealth: Payer: Self-pay | Admitting: Nurse Practitioner

## 2014-04-06 ENCOUNTER — Ambulatory Visit (INDEPENDENT_AMBULATORY_CARE_PROVIDER_SITE_OTHER): Payer: BLUE CROSS/BLUE SHIELD | Admitting: Obstetrics and Gynecology

## 2014-04-06 VITALS — BP 120/84 | HR 66 | Temp 98.3°F | Ht 66.5 in | Wt 180.8 lb

## 2014-04-06 DIAGNOSIS — R102 Pelvic and perineal pain: Secondary | ICD-10-CM | POA: Diagnosis not present

## 2014-04-06 DIAGNOSIS — Z113 Encounter for screening for infections with a predominantly sexual mode of transmission: Secondary | ICD-10-CM

## 2014-04-06 LAB — POCT URINALYSIS DIPSTICK
LEUKOCYTES UA: NEGATIVE
Urobilinogen, UA: NEGATIVE
pH, UA: 5

## 2014-04-06 NOTE — Telephone Encounter (Signed)
Have patient come in today for appointment with me.

## 2014-04-06 NOTE — Telephone Encounter (Signed)
Spoke with patient. Patient had IUD placed 01/01/2014 with Dr.Silva.  Patient states that Monday she began to notice light cramping in her abdomen and pelvic region. Tuesday and Wednesday the cramping continued with intermittent sharp pains. Last night patient states "I was sitting at the table and all of a sudden I became really nauseous and the cramping started back with sharp pain. I was getting hot and cold. It was just really odd." Patient denies any bleeding, vomiting, or fever. Patient is experiencing abdominal cramping today with intermittent sharp pain. Has not checked to see if she can feel her IUD strings. States that pain is more on the left side. Patient is available to be seen today. Advised will speak with provider and return call to get schedule. Patient is agreeable.

## 2014-04-06 NOTE — Telephone Encounter (Signed)
Spoke with patient. Advised that I spoke with Dr.Silva who would like patient to be seen in the office today. Patient is agreeable. Appointment scheduled for today at 11:45am with Dr.Silva. Patient is aware this is a work in appointment.  Routing to provider for final review. Patient agreeable to disposition. Will close encounter

## 2014-04-06 NOTE — Progress Notes (Signed)
GYNECOLOGY VISIT  PCP:  Adline Mango, MD  Referring provider:   HPI: 28 y.o.   Single  Caucasian  female   G0P0 with No LMP recorded. Patient is not currently having periods (Reason: IUD).   here for cramping.  Had a sharp pain a couple of days ago, mostly on the left Nausea last night and cramping.  No vomiting.  Some diarrhea.  No fevers.   No bleeding.  LMP was 02/13/14.  Intercourse last was yesterday am.  No pain with intercourse at that time but some pain more recently.   New partner for 1.5 months. No condom use.   Hgb:  n/a Urine:  Negative UPT:  Negative.   GYNECOLOGIC HISTORY: No LMP recorded. Patient is not currently having periods (Reason: IUD). Sexually active:  yes Partner preference: Female Contraception:  Skyla Menopausal hormone therapy: n/a DES exposure:   n/a Blood transfusions:   n/a Sexually transmitted diseases:   none GYN procedures and prior surgeries:  none Last mammogram:  n/a Last pap and high risk HPV testing:   08/01/13 NEG HR HPV negative  History of abnormal pap smear:  None   OB History    Gravida Para Term Preterm AB TAB SAB Ectopic Multiple Living   0                LIFESTYLE: Exercise:              Tobacco: no Alcohol: 2 drinks/wk Drug use:  no  There are no active problems to display for this patient.   Past Medical History  Diagnosis Date  . Hyperlipidemia   . Headache(784.0)     migraine w/o aura    Past Surgical History  Procedure Laterality Date  . Skyla iud      insertion 01-01-14    Current Outpatient Prescriptions  Medication Sig Dispense Refill  . folic acid (FOLVITE) 1 MG tablet Take 1 mg by mouth daily.    . Levonorgestrel (SKYLA) 13.5 MG IUD by Intrauterine route.    . Multiple Vitamin (MULTIVITAMIN) tablet Take 1 tablet by mouth daily.    . predniSONE (DELTASONE) 10 MG tablet Taper as follows: 4-4-4-3-3-2-2-1-1 24 tablet 0   No current facility-administered medications for this visit.      ALLERGIES: Review of patient's allergies indicates no known allergies.  Family History  Problem Relation Age of Onset  . Arthritis Mother   . Heart disease Maternal Aunt   . Lung cancer Maternal Grandmother   . Leukemia Paternal Grandmother     unsure correct type of cancer    History   Social History  . Marital Status: Single    Spouse Name: N/A  . Number of Children: 0  . Years of Education: N/A   Occupational History  . Not on file.   Social History Main Topics  . Smoking status: Never Smoker   . Smokeless tobacco: Never Used  . Alcohol Use: 1.2 oz/week    2 Standard drinks or equivalent per week  . Drug Use: No  . Sexual Activity:    Partners: Male    Birth Control/ Protection: IUD     Comment: skyla   Other Topics Concern  . Not on file   Social History Narrative    ROS:  Pertinent items are noted in HPI.  PHYSICAL EXAMINATION:    Ht 5' 6.5" (1.689 m)   Wt Readings from Last 3 Encounters:  04/04/14 182 lb (82.555 kg)  02/05/14 173 lb (78.472  kg)  01/01/14 168 lb (76.204 kg)     Ht Readings from Last 3 Encounters:  04/06/14 5' 6.5" (1.689 m)  02/05/14 5' 6.5" (1.689 m)  01/01/14 5' 6.5" (1.689 m)    General appearance: alert, cooperative and appears stated age Abdomen: soft, non-tender; no masses,  no organomegaly Extremities: extremities normal, atraumatic, no cyanosis or edema No abnormal inguinal nodes palpated Neurologic: Grossly normal  Pelvic: External genitalia:  no lesions              Urethra:  normal appearing urethra with no masses, tenderness or lesions              Bartholins and Skenes: normal                 Vagina: normal appearing vagina with normal color and discharge, no lesions              Cervix: normal appearance.  IUD strings noted.                  Bimanual Exam:  Uterus:  uterus is normal size, shape, consistency and nontender                                      Adnexa: normal adnexa in size, nontender and no  masses                                      ASSESSMENT  LLQ pain.  No acute abdomen.  No sign of PID.  I suspect a ruptured ovarian cyst.  Skyla IUD patient.  PLAN  Motrin 800 mg po q 8 hours prn.  STD testing today.  Return for persistent or increasing pain.   An After Visit Summary was printed and given to the patient.  15 minutes face to face time of which over 50% was spent in counseling.

## 2014-04-06 NOTE — Telephone Encounter (Signed)
Pt says she is having very bad cramps with the iud.

## 2014-04-07 LAB — HEPATITIS C ANTIBODY: HCV Ab: NEGATIVE

## 2014-04-07 LAB — STD PANEL
HIV 1&2 Ab, 4th Generation: NONREACTIVE
Hepatitis B Surface Ag: NEGATIVE

## 2014-04-10 LAB — IPS N GONORRHOEA AND CHLAMYDIA BY PCR

## 2014-05-31 ENCOUNTER — Encounter: Payer: Self-pay | Admitting: Family

## 2014-05-31 ENCOUNTER — Ambulatory Visit (INDEPENDENT_AMBULATORY_CARE_PROVIDER_SITE_OTHER): Payer: BLUE CROSS/BLUE SHIELD | Admitting: Family

## 2014-05-31 VITALS — BP 120/80 | HR 91 | Temp 98.5°F | Wt 192.0 lb

## 2014-05-31 DIAGNOSIS — T148 Other injury of unspecified body region: Secondary | ICD-10-CM

## 2014-05-31 DIAGNOSIS — W1809XA Striking against other object with subsequent fall, initial encounter: Secondary | ICD-10-CM | POA: Diagnosis not present

## 2014-05-31 DIAGNOSIS — M79605 Pain in left leg: Secondary | ICD-10-CM | POA: Diagnosis not present

## 2014-05-31 DIAGNOSIS — T148XXA Other injury of unspecified body region, initial encounter: Secondary | ICD-10-CM

## 2014-05-31 MED ORDER — CYCLOBENZAPRINE HCL 10 MG PO TABS: 10.0000 mg | ORAL_TABLET | Freq: Three times a day (TID) | ORAL | Status: DC | PRN
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
## 2014-05-31 NOTE — Patient Instructions (Signed)

## 2014-05-31 NOTE — Progress Notes (Signed)
Pre visit review using our clinic review tool, if applicable. No additional management support is needed unless otherwise documented below in the visit note. 

## 2014-08-02 ENCOUNTER — Ambulatory Visit (INDEPENDENT_AMBULATORY_CARE_PROVIDER_SITE_OTHER): Payer: BLUE CROSS/BLUE SHIELD | Admitting: Family Medicine

## 2014-08-02 ENCOUNTER — Encounter: Payer: Self-pay | Admitting: Family Medicine

## 2014-08-02 VITALS — BP 112/80 | HR 82 | Temp 97.5°F | Wt 197.0 lb

## 2014-08-02 DIAGNOSIS — G43709 Chronic migraine without aura, not intractable, without status migrainosus: Secondary | ICD-10-CM | POA: Insufficient documentation

## 2014-08-02 DIAGNOSIS — R519 Headache, unspecified: Secondary | ICD-10-CM

## 2014-08-02 DIAGNOSIS — R51 Headache: Secondary | ICD-10-CM

## 2014-08-02 DIAGNOSIS — G43009 Migraine without aura, not intractable, without status migrainosus: Secondary | ICD-10-CM | POA: Insufficient documentation

## 2014-08-02 MED ORDER — KETOROLAC TROMETHAMINE 60 MG/2ML IM SOLN
60.0000 mg | Freq: Once | INTRAMUSCULAR | Status: AC
  Administered 2014-08-02: 60 mg via INTRAMUSCULAR

## 2014-08-02 NOTE — Addendum Note (Signed)
Addended by: Lieutenant DiegoHINES, Millard Bautch A on: 08/02/2014 09:08 AM   Modules accepted: Orders

## 2014-08-02 NOTE — Patient Instructions (Signed)
Headache- history of migraines but different pattern today  Trial toradol (strong ibuprofen- do not take motrin rest of day)  Normal neurologic exam reassuring  Given new pattern though, let's have you come see us if you are still having issues next week to make sure no changes to exam or history

## 2014-08-02 NOTE — Assessment & Plan Note (Signed)
29 year old with history of migraine (sharp unilateral pain, usually with nausea, relieved by rest and motrin once every few months) now with dull throbbing bilateral headache lasting 1-2 days with 2-3 over last week. Given history of headache and Normal neuro exam, I am somewhat reassured. Wonder if reason unable to break the cycle has been inability to get rested up. We will trial a toradol injection 60mg  and have patient follow up if symptoms persist into next week or if worsens.   GIven age, weight gain over last year of 30 lbs, highest concern would be pseudotumor cerebri. Her vision has not been affected though and normal visual fields. If symptoms persist, would potentially get MRI and neuro consult.

## 2014-08-02 NOTE — Progress Notes (Addendum)
Wendy Conch, MD Phone: [phone]  Subjective:  Patient presents today to establish care with me as their new primary care provider but also has acute concenr of headache. Patient was formerly a patient of Dr. Orvan Molina. Chief complaint-noted.   Headaches -about 1 week. Dull throbbing, seems harder to focus. States behind both eyes and back of head. Bilateral. 5-6/10. Lasts 1-2 days. Has had 2-3 over last week. Motrin takes the edge off. Trouble swallowing pills at baseline so takes children's motrin. No nausea with this. Some trouble sleeping intermittently including currently.   Has had migraines before, they cause nausea and cause sharp pain. No known triggers for migraines-possible stress. A few months since she had a migraine. Usually just takes motrin and sleeps. 4-5 years ago would get them monthly.   Tennis, walks, gym at apartment for exercise-2-3 days a week.   ROS- No blurry or double vision.  Slight dizziness. Some tingling in legs over last few weeks. No fever/chills/neck stiffness. No recent tickbites. Has some tinnitus from time to time and no recent increase.   The following were reviewed and entered/updated in epic: Past Medical History  Diagnosis Date  . Hyperlipidemia   . Headache(784.0)     migraine w/o aura   Patient Active Problem List   Diagnosis Date Noted  . Headache    Past Surgical History  Procedure Laterality Date  . Skyla iud      insertion 01-01-14    Family History  Problem Relation Age of Onset  . Arthritis Mother   . Heart disease Maternal Aunt     MI 18-56  . Lung cancer Maternal Grandmother     smoker  . Leukemia Paternal Grandmother     unsure correct type of cancer    Medications- reviewed and updated Current Outpatient Prescriptions  Medication Sig Dispense Refill  . folic acid (FOLVITE) 1 MG tablet Take 1 mg by mouth daily.    . Levonorgestrel (SKYLA) 13.5 MG IUD by Intrauterine route.    . Multiple Vitamin (MULTIVITAMIN)  tablet Take 1 tablet by mouth daily.    . cyclobenzaprine (FLEXERIL) 10 MG tablet Take 1 tablet (10 mg total) by mouth 3 (three) times daily as needed for muscle spasms. (Patient not taking: Reported on 08/02/2014) 30 tablet 0  Also recently started on oral and topical for acne- doxycycline  Allergies-reviewed and updated No Known Allergies  History   Social History  . Marital Status: Single    Spouse Name: N/A  . Number of Children: 0  . Years of Education: N/A   Social History Main Topics  . Smoking status: Never Smoker   . Smokeless tobacco: Never Used  . Alcohol Use: 1.2 oz/week    2 Standard drinks or equivalent per week  . Drug Use: No  . Sexual Activity:    Partners: Male    Birth Control/ Protection: IUD     Comment: skyla   Other Topics Concern  . None   Social History Narrative   Single- in a relationship. No children. 3 cats.       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 Obese.  MSK: moves all extremities, no edema  Skin: warm and dry, no rash  Neuro: CN II-XII intact, sensation and reflexes normal throughout, 5/5 muscle strength in bilateral upper and lower extremities. Normal finger to nose. Normal rapid alternating movements. Normal romberg, no pronator drift.   Assessment/Plan:  Headache 29 year old with history of migraine (sharp unilateral pain, usually with nausea, relieved by rest and motrin once every few months) now with dull throbbing bilateral headache lasting 1-2 days with 2-3 over last week. Given history of headache and Normal neuro exam, I am somewhat reassured. Wonder if reason unable to break the cycle  has been inability to get rested up. We will trial a toradol injection 60mg  and have patient follow up if symptoms persist into next week or if worsens.   GIven age, weight gain over last year of 30 lbs, highest concern would be pseudotumor cerebri. Her vision has not been affected though and normal visual fields. If symptoms persist, would potentially get MRI and neuro consult.    Strict return precautions advised.

## 2014-08-14 ENCOUNTER — Encounter: Payer: Self-pay | Admitting: Nurse Practitioner

## 2014-08-14 ENCOUNTER — Ambulatory Visit (INDEPENDENT_AMBULATORY_CARE_PROVIDER_SITE_OTHER): Payer: BLUE CROSS/BLUE SHIELD | Admitting: Nurse Practitioner

## 2014-08-14 VITALS — BP 110/74 | HR 84 | Ht 66.5 in | Wt 200.0 lb

## 2014-08-14 DIAGNOSIS — R8290 Unspecified abnormal findings in urine: Secondary | ICD-10-CM | POA: Diagnosis not present

## 2014-08-14 DIAGNOSIS — Z01419 Encounter for gynecological examination (general) (routine) without abnormal findings: Secondary | ICD-10-CM

## 2014-08-14 DIAGNOSIS — Z0000 Encounter for general adult medical examination without abnormal findings: Secondary | ICD-10-CM | POA: Diagnosis not present

## 2014-08-14 LAB — POCT URINALYSIS DIPSTICK
Bilirubin, UA: NEGATIVE
Glucose, UA: NEGATIVE
KETONES UA: NEGATIVE
NITRITE UA: NEGATIVE
Protein, UA: NEGATIVE
RBC UA: NEGATIVE
UROBILINOGEN UA: NEGATIVE
pH, UA: 6

## 2014-08-14 LAB — HEMOGLOBIN, FINGERSTICK: Hemoglobin, fingerstick: 13.9 g/dL (ref 12.0–16.0)

## 2014-08-14 NOTE — Progress Notes (Signed)
Patient ID: Wendy Molina, female   DOB: December 18, 1985, 29 y.o.   MRN: [id number] 29 y.o. G0P0 Single  Caucasian Fe here for annual exam.  Menses has been irregular initially with IUD.  Now more regular for 2 months.  Lasting 4-5 days moderate, some cramps week prior to menses. Same partner since end if January.  Patient's last menstrual period was 07/29/2014 (exact date).          Sexually active: Yes.    The current method of family planning is IUD.  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/01/14. Exercising: Yes.    walking and going to gym about twice weekly Smoker:  no  Health Maintenance: Pap:  08/01/13, Negative TDaP:  06/22/12 Gardasil:  1 injection only 07/2010 Labs:  HB:  13.9  Urine:  Trace Leuk's - no symptoms.   reports that she has never smoked. She has never used smokeless tobacco. She reports that she drinks about 1.2 oz of alcohol per week. She reports that she does not use illicit drugs.  Past Medical History  Diagnosis Date  . Hyperlipidemia   . Headache(784.0)     migraine w/o aura    Past Surgical History  Procedure Laterality Date  . Skyla iud      insertion 01-01-14    Current Outpatient Prescriptions  Medication Sig Dispense Refill  . adapalene (DIFFERIN) 0.1 % gel Apply topically at bedtime.    . benzoyl peroxide (BENZOYL PEROXIDE) 5 % external liquid Apply topically 2 (two) times daily.    . cyclobenzaprine (FLEXERIL) 10 MG tablet Take 1 tablet (10 mg total) by mouth 3 (three) times daily as needed for muscle spasms. 30 tablet 0  . doxycycline (DORYX) 100 MG DR capsule Take 100 mg by mouth 2 (two) times daily.    . folic acid (FOLVITE) 1 MG tablet Take 1 mg by mouth daily.    . Levonorgestrel (SKYLA) 13.5 MG IUD by Intrauterine route.    . Multiple Vitamin (MULTIVITAMIN) tablet Take 1 tablet by mouth daily.     No current facility-administered medications for this visit.    Family History  Problem Relation Age of Onset  . Arthritis Mother   . Colitis Mother   . Heart  disease Maternal Aunt     MI [id number]  . Lung cancer Maternal Grandmother     smoker  . Cancer Maternal Grandmother     kidney  . Leukemia Paternal Grandmother     unsure correct type of cancer  . Heart failure Paternal Grandmother   . Alzheimer's disease Maternal Grandfather   . Kidney failure Paternal Grandfather     ? dialysis    ROS:  Pertinent items are noted in HPI.  Otherwise, a comprehensive ROS was negative.  Exam:   BP 110/74 mmHg  Pulse 84  Ht 5' 6.5" (1.689 m)  Wt 200 lb (90.719 kg)  BMI 31.80 kg/m2  LMP 07/29/2014 (Exact Date) Height: 5' 6.5" (168.9 cm) Ht Readings from Last 3 Encounters:  08/14/14 5' 6.5" (1.689 m)  04/06/14 5' 6.5" (1.689 m)  02/05/14 5' 6.5" (1.689 m)    General appearance: alert, cooperative and appears stated age Head: Normocephalic, without obvious abnormality, atraumatic Neck: no adenopathy, supple, symmetrical, trachea midline and thyroid normal to inspection and palpation Lungs: clear to auscultation bilaterally Breasts: normal appearance, no masses or tenderness Heart: regular rate and rhythm Abdomen: soft, non-tender; no masses,  no organomegaly Extremities: extremities normal, atraumatic, no cyanosis or edema Skin: Skin color, texture, turgor normal. No  rashes or lesions Lymph nodes: Cervical, supraclavicular, and axillary nodes normal. No abnormal inguinal nodes palpated Neurologic: Grossly normal   Pelvic: External genitalia:  no lesions              Urethra:  normal appearing urethra with no masses, tenderness or lesions              Bartholin's and Skene's: normal                 Vagina: normal appearing vagina with normal color and discharge, no lesions              Cervix: anteverted IUD strings are visible              Pap taken: Yes.   Bimanual Exam:  Uterus:  normal size, contour, position, consistency, mobility, non-tender              Adnexa: no mass, fullness, tenderness               Rectovaginal: Confirms                Anus:  normal sphincter tone, no lesions  Chaperone present:  yes  A:  Well Woman with normal exam  Skyla IUD 01/01/14  Weight gain and increase in acne since IUD  R/O UTI  P:   Reviewed health and wellness pertinent to exam  Pap smear as above  Will have pt to return in 6 months for a weight check and review acne - since this is worse on Skyla IUD  No treatment for UTI - asymptomatic - await C&S  Did not repeat STD's this visit since just done 04/06/14 and was negative.  Counseled on STD prevention, HIV risk factors and prevention, adequate intake of calcium and vitamin D, diet and exercise return annually or prn  An After Visit Summary was printed and given to the patient.

## 2014-08-14 NOTE — Patient Instructions (Signed)
General topics  Next pap or exam is  due in 1 year Take a Women's multivitamin Take 1200 mg. of calcium daily - prefer dietary If any concerns in interim to call back  Breast Self-Awareness Practicing breast self-awareness may pick up problems early, prevent significant medical complications, and possibly save your life. By practicing breast self-awareness, you can become familiar with how your breasts look and feel and if your breasts are changing. This allows you to notice changes early. It can also offer you some reassurance that your breast health is good. One way to learn what is normal for your breasts and whether your breasts are changing is to do a breast self-exam. If you find a lump or something that was not present in the past, it is best to contact your caregiver right away. Other findings that should be evaluated by your caregiver include nipple discharge, especially if it is bloody; skin changes or reddening; areas where the skin seems to be pulled in (retracted); or new lumps and bumps. Breast pain is seldom associated with cancer (malignancy), but should also be evaluated by a caregiver. BREAST SELF-EXAM The best time to examine your breasts is 5 7 days after your menstrual period is over.  ExitCare Patient Information 2013 [employer].   Exercise to Stay Healthy Exercise helps you become and stay healthy. EXERCISE IDEAS AND TIPS Choose exercises that:  You enjoy.  Fit into your day. You do not need to exercise really hard to be healthy. You can do exercises at a slow or medium level and stay healthy. You can:  Stretch before and after working out.  Try yoga, Pilates, or tai chi.  Lift weights.  Walk fast, swim, jog, run, climb stairs, bicycle, dance, or rollerskate.  Take aerobic classes. Exercises that burn about 150 calories:  Running 1  miles in 15 minutes.  Playing volleyball for 45 to 60 minutes.  Washing and waxing a car for 45 to 60  minutes.  Playing touch football for 45 minutes.  Walking 1  miles in 35 minutes.  Pushing a stroller 1  miles in 30 minutes.  Playing basketball for 30 minutes.  Raking leaves for 30 minutes.  Bicycling 5 miles in 30 minutes.  Walking 2 miles in 30 minutes.  Dancing for 30 minutes.  Shoveling snow for 15 minutes.  Swimming laps for 20 minutes.  Walking up stairs for 15 minutes.  Bicycling 4 miles in 15 minutes.  Gardening for 30 to 45 minutes.  Jumping rope for 15 minutes.  Washing windows or floors for 45 to 60 minutes. Document Released: 02/14/2010 Document Revised: 04/06/2011 Document Reviewed: 02/14/2010 ExitCare Patient Information 2013 [employer].   Other topics ( that may be useful information):    Sexually Transmitted Disease Sexually transmitted disease (STD) refers to any infection that is passed from person to person during sexual activity. This may happen by way of saliva, semen, blood, vaginal mucus, or urine. Common STDs include:  Gonorrhea.  Chlamydia.  Syphilis.  HIV/AIDS.  Genital herpes.  Hepatitis B and C.  Trichomonas.  Human papillomavirus (HPV).  Pubic lice. CAUSES  An STD may be spread by bacteria, virus, or parasite. A person can get an STD by:  Sexual intercourse with an infected person.  Sharing sex toys with an infected person.  Sharing needles with an infected person.  Having intimate contact with the genitals, mouth, or rectal areas of an infected person. SYMPTOMS  Some people may not have any symptoms, but   they can still pass the infection to others. Different STDs have different symptoms. Symptoms include:  Painful or bloody urination.  Pain in the pelvis, abdomen, vagina, anus, throat, or eyes.  Skin rash, itching, irritation, growths, or sores (lesions). These usually occur in the genital or anal area.  Abnormal vaginal discharge.  Penile discharge in men.  Soft, flesh-colored skin growths in the  genital or anal area.  Fever.  Pain or bleeding during sexual intercourse.  Swollen glands in the groin area.  Yellow skin and eyes (jaundice). This is seen with hepatitis. DIAGNOSIS  To make a diagnosis, your caregiver may:  Take a medical history.  Perform a physical exam.  Take a specimen (culture) to be examined.  Examine a sample of discharge under a microscope.  Perform blood test TREATMENT   Chlamydia, gonorrhea, trichomonas, and syphilis can be cured with antibiotic medicine.  Genital herpes, hepatitis, and HIV can be treated, but not cured, with prescribed medicines. The medicines will lessen the symptoms.  Genital warts from HPV can be treated with medicine or by freezing, burning (electrocautery), or surgery. Warts may come back.  HPV is a virus and cannot be cured with medicine or surgery.However, abnormal areas may be followed very closely by your caregiver and may be removed from the cervix, vagina, or vulva through office procedures or surgery. If your diagnosis is confirmed, your recent sexual partners need treatment. This is true even if they are symptom-free or have a negative culture or evaluation. They should not have sex until their caregiver says it is okay. HOME CARE INSTRUCTIONS  All sexual partners should be informed, tested, and treated for all STDs.  Take your antibiotics as directed. Finish them even if you start to feel better.  Only take over-the-counter or prescription medicines for pain, discomfort, or fever as directed by your caregiver.  Rest.  Eat a balanced diet and drink enough fluids to keep your urine clear or pale yellow.  Do not have sex until treatment is completed and you have followed up with your caregiver. STDs should be checked after treatment.  Keep all follow-up appointments, Pap tests, and blood tests as directed by your caregiver.  Only use latex condoms and water-soluble lubricants during sexual activity. Do not use  petroleum jelly or oils.  Avoid alcohol and illegal drugs.  Get vaccinated for HPV and hepatitis. If you have not received these vaccines in the past, talk to your caregiver about whether one or both might be right for you.  Avoid risky sex practices that can break the skin. The only way to avoid getting an STD is to avoid all sexual activity.Latex condoms and dental dams (for oral sex) will help lessen the risk of getting an STD, but will not completely eliminate the risk. SEEK MEDICAL CARE IF:   You have a fever.  You have any new or worsening symptoms. Document Released: 04/04/2002 Document Revised: 04/06/2011 Document Reviewed: 04/11/2010 [facility] Patient Information 2013 [employer].    Domestic Abuse You are being battered or abused if someone close to you hits, pushes, or physically hurts you in any way. You also are being abused if you are forced into activities. You are being sexually abused if you are forced to have sexual contact of any kind. You are being emotionally abused if you are made to feel worthless or if you are constantly threatened. It is important to remember that help is available. No one has the right to abuse you. PREVENTION OF FURTHER  ABUSE  Learn the warning signs of danger. This varies with situations but may include: the use of alcohol, threats, isolation from friends and family, or forced sexual contact. Leave if you feel that violence is going to occur.  If you are attacked or beaten, report it to the police so the abuse is documented. You do not have to press charges. The police can protect you while you or the attackers are leaving. Get the officer's name and badge number and a copy of the report.  Find someone you can trust and tell them what is happening to you: your caregiver, a nurse, clergy member, close friend or family member. Feeling ashamed is natural, but remember that you have done nothing wrong. No one deserves abuse. Document Released:  01/10/2000 Document Revised: 04/06/2011 Document Reviewed: 03/20/2010 ExitCare Patient Information 2013 [employer].    How Much is Too Much Alcohol? Drinking too much alcohol can cause injury, accidents, and health problems. These types of problems can include:   Car crashes.  Falls.  Family fighting (domestic violence).  Drowning.  Fights.  Injuries.  Burns.  Damage to certain organs.  Having a baby with birth defects. ONE DRINK CAN BE TOO MUCH WHEN YOU ARE:  Working.  Pregnant or breastfeeding.  Taking medicines. Ask your doctor.  Driving or planning to drive. If you or someone you know has a drinking problem, get help from a doctor.  Document Released: 11/08/2008 Document Revised: 04/06/2011 Document Reviewed: 11/08/2008 ExitCare Patient Information 2013 [employer].   Smoking Hazards Smoking cigarettes is extremely bad for your health. Tobacco smoke has over 200 known poisons in it. There are over 60 chemicals in tobacco smoke that cause cancer. Some of the chemicals found in cigarette smoke include:   Cyanide.  Benzene.  Formaldehyde.  Methanol (wood alcohol).  Acetylene (fuel used in welding torches).  Ammonia. Cigarette smoke also contains the poisonous gases nitrogen oxide and carbon monoxide.  Cigarette smokers have an increased risk of many serious medical problems and Smoking causes approximately:  90% of all lung cancer deaths in men.  80% of all lung cancer deaths in women.  90% of deaths from chronic obstructive lung disease. Compared with nonsmokers, smoking increases the risk of:  Coronary heart disease by 2 to 4 times.  Stroke by 2 to 4 times.  Men developing lung cancer by 23 times.  Women developing lung cancer by 13 times.  Dying from chronic obstructive lung diseases by 12 times.  . Smoking is the most preventable cause of death and disease in our society.  WHY IS SMOKING ADDICTIVE?  Nicotine is the chemical  agent in tobacco that is capable of causing addiction or dependence.  When you smoke and inhale, nicotine is absorbed rapidly into the bloodstream through your lungs. Nicotine absorbed through the lungs is capable of creating a powerful addiction. Both inhaled and non-inhaled nicotine may be addictive.  Addiction studies of cigarettes and spit tobacco show that addiction to nicotine occurs mainly during the teen years, when young people begin using tobacco products. WHAT ARE THE BENEFITS OF QUITTING?  There are many health benefits to quitting smoking.   Likelihood of developing cancer and heart disease decreases. Health improvements are seen almost immediately.  Blood pressure, pulse rate, and breathing patterns start returning to normal soon after quitting. QUITTING SMOKING   American Lung Association - 1-800-LUNGUSA  American Cancer Society - 1-800-ACS-2345 Document Released: 02/20/2004 Document Revised: 04/06/2011 Document Reviewed: 10/24/2008 ExitCare Patient Information 2013 ExitCare[employer]   LL[employer].   Stress Management Stress is a state of physical or mental tension that often results from changes in your life or normal routine. Some common causes of stress are:  Death of a loved one.  Injuries or severe illnesses.  Getting fired or changing jobs.  Moving into a new home. Other causes may be:  Sexual problems.  Business or financial losses.  Taking on a large debt.  Regular conflict with someone at home or at work.  Constant tiredness from lack of sleep. It is not just bad things that are stressful. It may be stressful to:  Win the lottery.  Get married.  Buy a new car. The amount of stress that can be easily tolerated varies from person to person. Changes generally cause stress, regardless of the types of change. Too much stress can affect your health. It may lead to physical or emotional problems. Too little stress (boredom) may also become stressful. SUGGESTIONS TO  REDUCE STRESS:  Talk things over with your family and friends. It often is helpful to share your concerns and worries. If you feel your problem is serious, you may want to get help from a professional counselor.  Consider your problems one at a time instead of lumping them all together. Trying to take care of everything at once may seem impossible. List all the things you need to do and then start with the most important one. Set a goal to accomplish 2 or 3 things each day. If you expect to do too many in a single day you will naturally fail, causing you to feel even more stressed.  Do not use alcohol or drugs to relieve stress. Although you may feel better for a short time, they do not remove the problems that caused the stress. They can also be habit forming.  Exercise regularly - at least 3 times per week. Physical exercise can help to relieve that "uptight" feeling and will relax you.  The shortest distance between despair and hope is often a good night's sleep.  Go to bed and get up on time allowing yourself time for appointments without being rushed.  Take a short "time-out" period from any stressful situation that occurs during the day. Close your eyes and take some deep breaths. Starting with the muscles in your face, tense them, hold it for a few seconds, then relax. Repeat this with the muscles in your neck, shoulders, hand, stomach, back and legs.  Take good care of yourself. Eat a balanced diet and get plenty of rest.  Schedule time for having fun. Take a break from your daily routine to relax. HOME CARE INSTRUCTIONS   Call if you feel overwhelmed by your problems and feel you can no longer manage them on your own.  Return immediately if you feel like hurting yourself or someone else. Document Released: 07/08/2000 Document Revised: 04/06/2011 Document Reviewed: 02/28/2007 ExitCare Patient Information 2013 [employer].  

## 2014-08-15 NOTE — Progress Notes (Signed)
Encounter reviewed by Dr. Brook Amundson C. Silva.  

## 2014-08-16 LAB — URINE CULTURE

## 2014-08-16 LAB — IPS PAP TEST WITH HPV

## 2014-08-27 DIAGNOSIS — S92909A Unspecified fracture of unspecified foot, initial encounter for closed fracture: Secondary | ICD-10-CM

## 2014-08-27 HISTORY — DX: Unspecified fracture of unspecified foot, initial encounter for closed fracture: S92.909A

## 2014-09-19 ENCOUNTER — Encounter: Payer: Self-pay | Admitting: Family Medicine

## 2014-09-19 ENCOUNTER — Ambulatory Visit (INDEPENDENT_AMBULATORY_CARE_PROVIDER_SITE_OTHER): Payer: BLUE CROSS/BLUE SHIELD | Admitting: Family Medicine

## 2014-09-19 VITALS — BP 110/66 | HR 90 | Temp 98.3°F | Wt 203.0 lb

## 2014-09-19 DIAGNOSIS — M79672 Pain in left foot: Secondary | ICD-10-CM

## 2014-09-19 DIAGNOSIS — R635 Abnormal weight gain: Secondary | ICD-10-CM | POA: Diagnosis not present

## 2014-09-19 DIAGNOSIS — R51 Headache: Secondary | ICD-10-CM

## 2014-09-19 DIAGNOSIS — R519 Headache, unspecified: Secondary | ICD-10-CM

## 2014-09-19 LAB — TSH: TSH: 1.54 u[IU]/mL (ref 0.35–4.50)

## 2014-09-19 MED ORDER — KETOROLAC TROMETHAMINE 60 MG/2ML IM SOLN
30.0000 mg | Freq: Once | INTRAMUSCULAR | Status: AC
  Administered 2014-09-19: 30 mg via INTRAMUSCULAR

## 2014-09-19 MED ORDER — ZOLMITRIPTAN 2.5 MG PO TBDP: 2.5000 mg | ORAL_TABLET | ORAL | Status: DC | PRN

## 2014-09-19 NOTE — Patient Instructions (Signed)
Medication Instructions:  Same, see list Trial zolmitriptan for migraines  Other Instructions:  Continue to ice foot No ibuprofen today Can use ibuprofen again tomorrow Follow up 2 weeks if symptoms persist in foot  Follow-Up (all visit scheduling, rescheduling, cancellations including labs should be scheduled at front desk): 1-2 months for headache check.   Sooner if new or worsening symptoms

## 2014-09-19 NOTE — Assessment & Plan Note (Signed)
Suspect migraines. Only difference from prior headaches is that previously character was sharp and is now throbbing sensation. We will trial diagnostic and therapeutic trial of triptans. Last visit was concerned about idiopathic intracranial hypertension- think this is less likely with no papilledema and no blurry or double vision. We discussed still seeing her eye doctor for another look/opinion. Issues with pills so trial ODT triptan. Consider prophylaxis if this does not make headaches more manageable- toradol today before she leaves to knock out of current headaches cycle.

## 2014-09-19 NOTE — Progress Notes (Signed)
[name], MD  Subjective:  Wendy Molina is a 29 y.o. year old very pleasant female patient who presents with:  Headache- likely migraine.  -toradol worked well for a while. Over last 2 weeks, slowly restarted.   Headache more on left but behind both eyes. Lasts for a few days. Trouble focusing on tasks at hand. Tinnitus, comes and goes, lasts for 30 minutes, not pulsatile.  States she was diagnosed with migraines years ago by urgent care. Got a "shot" and felt better for some period (was not toradol). Shot that knocked her out for days. More of a stabbing pain frontal area in past, occasional but rare nausea. Hard to focus.   L foot. Pain on top of foot, then seemed to increase in size with swelling. Trouble walking. Went ot urgent care- gave ibuprofen. Just above toes up to front of ankle. Occasional calf swelling 3trd and 4th toes with some mild numbness at times. Pain up to 7/10 previously. Pain 5/10 now. Able to walk without a limp.   ROS- no blurry vision, double vision, aura, lightheadedness, photophobia  Past Medical History- never smoker  Medications- reviewed and updated Current Outpatient Prescriptions  Medication Sig Dispense Refill  . adapalene (DIFFERIN) 0.1 % gel Apply topically at bedtime.    . benzoyl peroxide (BENZOYL PEROXIDE) 5 % external liquid Apply topically 2 (two) times daily.    Marland Kitchen doxycycline (DORYX) 100 MG DR capsule Take 100 mg by mouth 2 (two) times daily.    . folic acid (FOLVITE) 1 MG tablet Take 1 mg by mouth daily.    . Levonorgestrel (SKYLA) 13.5 MG IUD by Intrauterine route.    . Multiple Vitamin (MULTIVITAMIN) tablet Take 1 tablet by mouth daily.     Objective: BP 110/66 mmHg  Pulse 90  Temp(Src) 98.3 F (36.8 C)  Wt 203 lb (92.08 kg) Gen: NAD, resting comfortably in chair PERRLA, no papilledema  CV: RRR no mrg  Lungs: CTAB  Abd: soft/nontender/nondistended/normal bowel sounds  MSK: moves all extremities, no edema  Left foot mild  swelling pain with dorsiflexion, pain over most of dorsum of foot with palpation mild. Normal gait. No erythema. Some pain with inversion of foot as well.  Skin: warm and dry, no rash  Neuro: CN II-XII intact, sensation and reflexes normal throughout, 5/5 muscle strength in bilateral upper and lower extremities. Normal finger to nose. Normal rapid alternating movements.     Assessment/Plan:  Headache Suspect migraines. Only difference from prior headaches is that previously character was sharp and is now throbbing sensation. We will trial diagnostic and therapeutic trial of triptans. Last visit was concerned about idiopathic intracranial hypertension- think this is less likely with no papilledema and no blurry or double vision. We discussed still seeing her eye doctor for another look/opinion. Issues with pills so trial ODT triptan. Consider prophylaxis if this does not make headaches more manageable- toradol today before she leaves to knock out of current headaches cycle.    Also has some left foot pain- suspect a tendonitis, does have swelling but no injury history. Doubt x-rays helpful. Discussed conservative care. Consider sports med for ultrasound if continued issues at follow up or if does not continue to improve. No red flags.   Also check TSH due to weight gain >20 lbs and family history thyroid issues per patient.   Return precautions advised.   Orders Placed This Encounter  Procedures  . TSH    [place]    Meds ordered this encounter  Medications  . zolmitriptan (ZOMIG-ZMT) 2.5 MG disintegrating tablet    Sig: Take 1 tablet (2.5 mg total) by mouth as needed for migraine (may take repeat dose 2 hours later x1).    Dispense:  10 tablet    Refill:  0  . ketorolac (TORADOL) injection 30 mg    Sig:

## 2014-10-23 ENCOUNTER — Ambulatory Visit (INDEPENDENT_AMBULATORY_CARE_PROVIDER_SITE_OTHER): Payer: BLUE CROSS/BLUE SHIELD | Admitting: Family Medicine

## 2014-10-23 ENCOUNTER — Encounter: Payer: Self-pay | Admitting: Family Medicine

## 2014-10-23 VITALS — BP 130/87 | HR 87 | Temp 98.2°F | Wt 204.0 lb

## 2014-10-23 DIAGNOSIS — S92301A Fracture of unspecified metatarsal bone(s), right foot, initial encounter for closed fracture: Secondary | ICD-10-CM

## 2014-10-23 DIAGNOSIS — G43009 Migraine without aura, not intractable, without status migrainosus: Secondary | ICD-10-CM | POA: Diagnosis not present

## 2014-10-23 MED ORDER — ZOLMITRIPTAN 2.5 MG PO TBDP: 2.5000 mg | ORAL_TABLET | ORAL | Status: DC | PRN

## 2014-10-23 NOTE — Assessment & Plan Note (Addendum)
S: I saw patient about a month ago we started Zomig ODT. Patient states she's had to use it 2-3 times over the last month. Each time it has aborted her headache. In addition the Toradol last visit also aborted her headache. She did see her eye doctor and there was no papilledema.  A/P: Typical migraines. Therapeutic and diagnostic trial of triptan successful. Triggers seem to be poor sleep and stress. Patient will try to avoid these. But she does have triptan available as needed in the form Zomig

## 2014-10-23 NOTE — Progress Notes (Signed)
[name], MD  Subjective:  Wendy Molina is a 29 y.o. year old very pleasant female patient who presents for/with See problem oriented charting ROS- no blurry vision. No intractable headaches. No chest pain or shortness of breath. No palpitations  Past Medical History-  Patient Active Problem List   Diagnosis Date Noted  . Migraine without aura     Medications- reviewed and updated Current Outpatient Prescriptions  Medication Sig Dispense Refill  . adapalene (DIFFERIN) 0.1 % gel Apply topically at bedtime.    . benzoyl peroxide (BENZOYL PEROXIDE) 5 % external liquid Apply topically 2 (two) times daily.    . folic acid (FOLVITE) 1 MG tablet Take 1 mg by mouth daily.    . Levonorgestrel (SKYLA) 13.5 MG IUD by Intrauterine route.    . Multiple Vitamin (MULTIVITAMIN) tablet Take 1 tablet by mouth daily.    Marland Kitchen zolmitriptan (ZOMIG-ZMT) 2.5 MG disintegrating tablet Take 1 tablet (2.5 mg total) by mouth as needed for migraine (may take repeat dose 2 hours later x1). 10 tablet 5   No current facility-administered medications for this visit.    Objective: BP 130/87 mmHg  Pulse 87  Temp(Src) 98.2 F (36.8 C)  Wt 204 lb (92.534 kg) Gen: NAD, resting comfortably CV: RRR no murmurs rubs or gallops Lungs: CTAB no crackles, wheeze, rhonchi Abdomen: soft/nontender/nondistended/normal bowel sounds. No rebound or guarding.  Ext: no edema, Boot left foot, 1+ edema in that leg (never has gone down after injury) Skin: warm, dry Neuro: grossly normal, moves all extremities, normal gait except with difficulty of boot  Assessment/Plan:  Migraine without aura S: I saw patient about a month ago we started Zomig ODT. Patient states she's had to use it 2-3 times over the last month. Each time it has aborted her headache. In addition the Toradol last visit also aborted her headache. She did see her eye doctor and there was no papilledema.  A/P: Typical migraines. Therapeutic and diagnostic  trial of triptan successful. Triggers seem to be poor sleep and stress. Patient will try to avoid these. But she does have triptan available as needed in the form Zomig   5th metatarsal fracture left S: Saw orthopedics and is in boot for 4-6 weeks. Her pain is improving. She did run out of ibuprofen. She is aware of risks that this may inhibit healing. She requests refill which must be in liquid form A/P: We rinsed out to her pharmacy and refilled solution of ibuprofen. She is to have further follow-up with orthopedics.  Return precautions advised.   Meds ordered this encounter  Medications  . zolmitriptan (ZOMIG-ZMT) 2.5 MG disintegrating tablet    Sig: Take 1 tablet (2.5 mg total) by mouth as needed for migraine (may take repeat dose 2 hours later x1).    Dispense:  10 tablet    Refill:  5

## 2014-10-23 NOTE — Patient Instructions (Signed)
Glad headaches are doing better on the zomig. We are going to official diagnose you as having migraines at this point.   We are going to try to refill the ibuprofen- we are reaching out to your pharmacy

## 2014-12-10 ENCOUNTER — Telehealth: Payer: Self-pay | Admitting: Nurse Practitioner

## 2014-12-10 ENCOUNTER — Ambulatory Visit (INDEPENDENT_AMBULATORY_CARE_PROVIDER_SITE_OTHER): Payer: BLUE CROSS/BLUE SHIELD | Admitting: Obstetrics and Gynecology

## 2014-12-10 ENCOUNTER — Encounter: Payer: Self-pay | Admitting: Obstetrics and Gynecology

## 2014-12-10 VITALS — BP 130/78 | HR 76 | Temp 98.0°F | Ht 66.5 in | Wt 206.8 lb

## 2014-12-10 DIAGNOSIS — M25552 Pain in left hip: Secondary | ICD-10-CM

## 2014-12-10 DIAGNOSIS — R300 Dysuria: Secondary | ICD-10-CM | POA: Diagnosis not present

## 2014-12-10 DIAGNOSIS — R1032 Left lower quadrant pain: Secondary | ICD-10-CM | POA: Diagnosis not present

## 2014-12-10 DIAGNOSIS — N939 Abnormal uterine and vaginal bleeding, unspecified: Secondary | ICD-10-CM | POA: Diagnosis not present

## 2014-12-10 LAB — POCT URINALYSIS DIPSTICK
Bilirubin, UA: NEGATIVE
Blood, UA: NEGATIVE
Glucose, UA: NEGATIVE
KETONES UA: NEGATIVE
Leukocytes, UA: NEGATIVE
Nitrite, UA: NEGATIVE
PROTEIN UA: NEGATIVE
Urobilinogen, UA: NEGATIVE
pH, UA: 5

## 2014-12-10 LAB — POCT URINE PREGNANCY: PREG TEST UR: NEGATIVE

## 2014-12-10 NOTE — Telephone Encounter (Signed)
Spoke with patient. Patient states that 3 days ago she began to have pain in her pelvic area on the left side. Over the last couple of days pain has increased. Last night she pushed on the area of discomfort and pain worsened and began to have nausea. Took Ibuprofen with no relief. Pain is currently a 5/10. Denies any current nausea or vomiting. Denies any fevers or chills. Does have a Skyla IUD in place. Advised she will need to be seen in the office for further evaluation. Appointment scheduled for today at 10:30 am with Dr.Silva. Patient is agreeable to date and time.   Routing to provider for final review. Patient agreeable to disposition. Will close encounter.

## 2014-12-10 NOTE — Progress Notes (Signed)
Patient scheduled for Pelvic ultrasound at [facility]. Declines appointment today due to her schedule.  Scheduled for 12/11/14 at 1500. Arrive at 1445 with full bladder.  Patient given instructions. Advised will call her with results. Patient agreeable.
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
## 2014-12-10 NOTE — Telephone Encounter (Signed)
Patient is having left side sharp pain and wants to schedule an appointment for today.

## 2014-12-11 ENCOUNTER — Ambulatory Visit (HOSPITAL_COMMUNITY)
Admission: RE | Admit: 2014-12-11 | Discharge: 2014-12-11 | Disposition: A | Discharge status: Home / Self Care | Payer: BLUE CROSS/BLUE SHIELD | Source: Ambulatory Visit | Attending: Obstetrics and Gynecology | Admitting: Obstetrics and Gynecology

## 2014-12-11 DIAGNOSIS — R1032 Left lower quadrant pain: Secondary | ICD-10-CM | POA: Diagnosis not present

## 2014-12-11 DIAGNOSIS — Z30431 Encounter for routine checking of intrauterine contraceptive device: Secondary | ICD-10-CM | POA: Insufficient documentation

## 2014-12-11 DIAGNOSIS — N939 Abnormal uterine and vaginal bleeding, unspecified: Secondary | ICD-10-CM

## 2014-12-11 DIAGNOSIS — M25552 Pain in left hip: Secondary | ICD-10-CM

## 2014-12-11 DIAGNOSIS — R300 Dysuria: Secondary | ICD-10-CM | POA: Diagnosis not present

## 2014-12-11 IMAGING — US US PELVIS COMPLETE
1 series · 15 of 25 positions shown · non-contrast
Comparison: None

CLINICAL DATA: Left lower quadrant pain for 5 days. Abnormal
uterine bleeding. Dysuria. IUD. LMP [DATE].

EXAM:
TRANSABDOMINAL AND TRANSVAGINAL ULTRASOUND OF PELVIS
TECHNIQUE: Both transabdominal and transvaginal ultrasound examinations of the
pelvis were performed. Transabdominal technique was performed for
global imaging of the pelvis including uterus, ovaries, adnexal
regions, and pelvic cul-de-sac. It was necessary to proceed with
endovaginal exam following the transabdominal exam to visualize the
IUD and ovaries.

[Series 1: us pelvis complete · 15 of 53 slices shown]
[im 1/53]
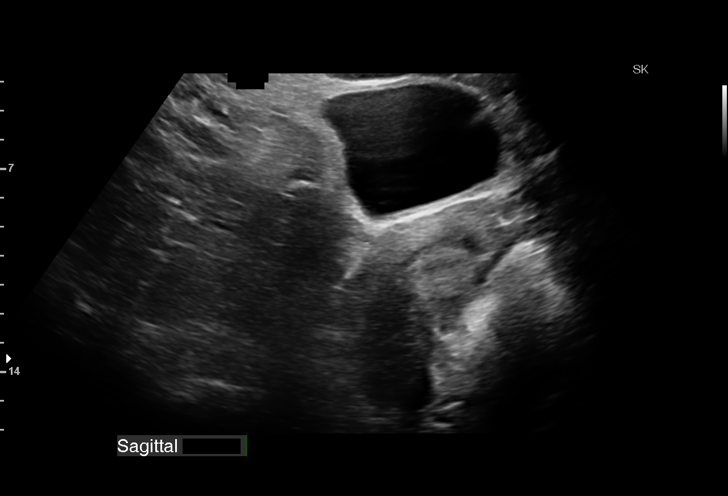
[im 5/53]
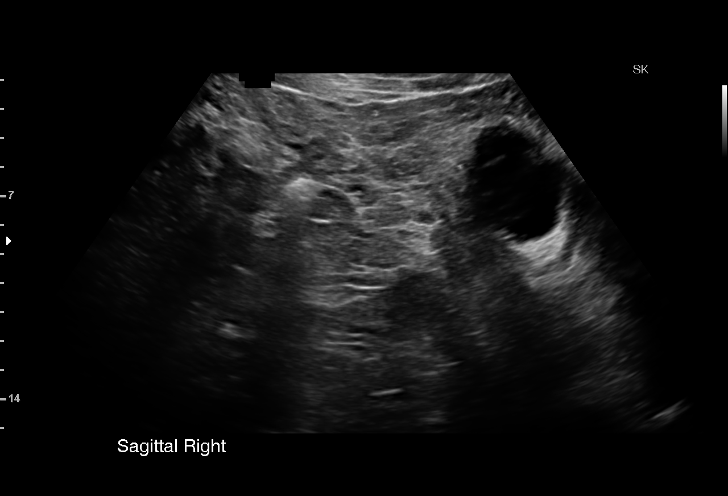
[im 9/53]
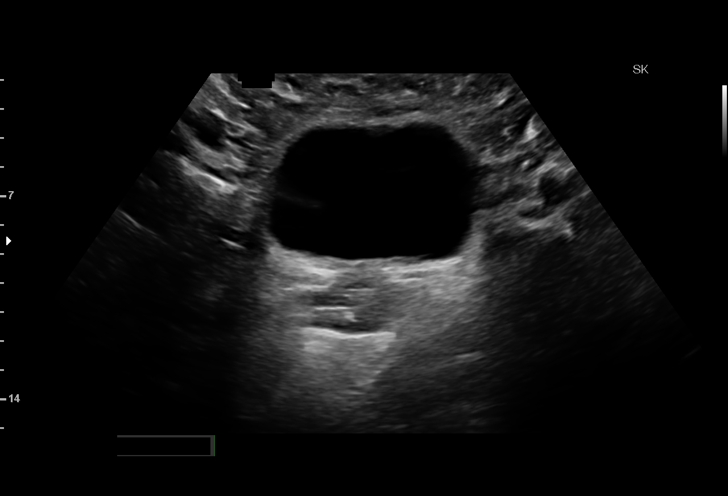
[im 11/53]
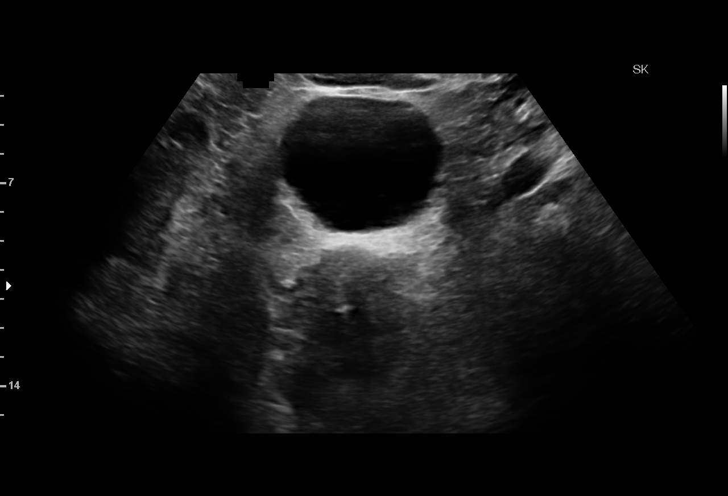
[im 16/53]
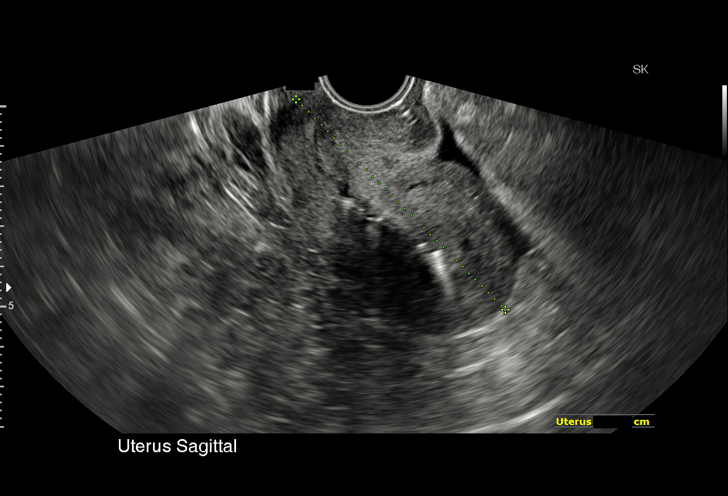
[im 20/53]
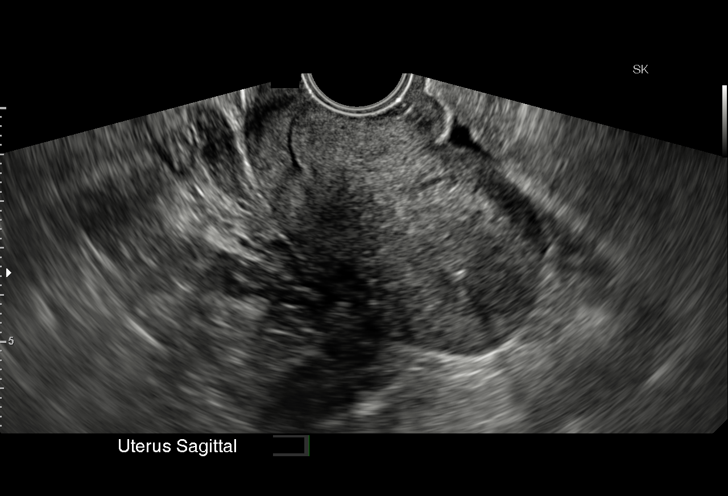
[im 22/53]
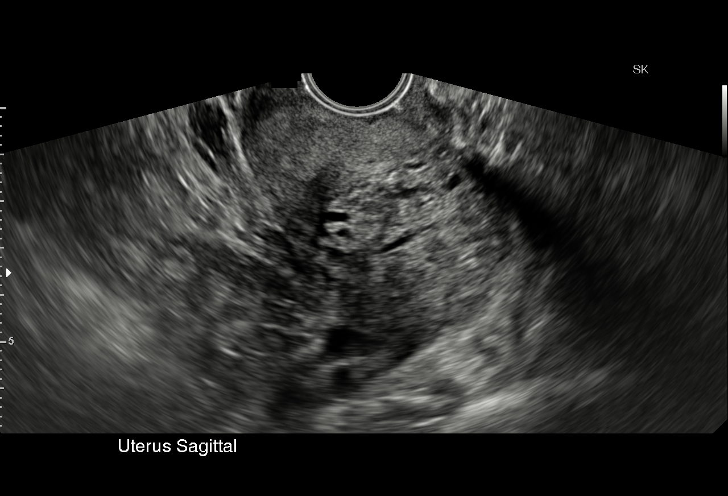
[im 27/53]
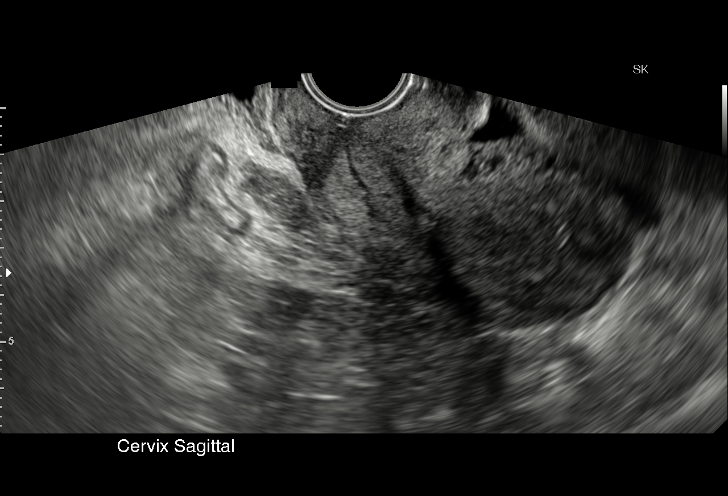
[im 31/53]
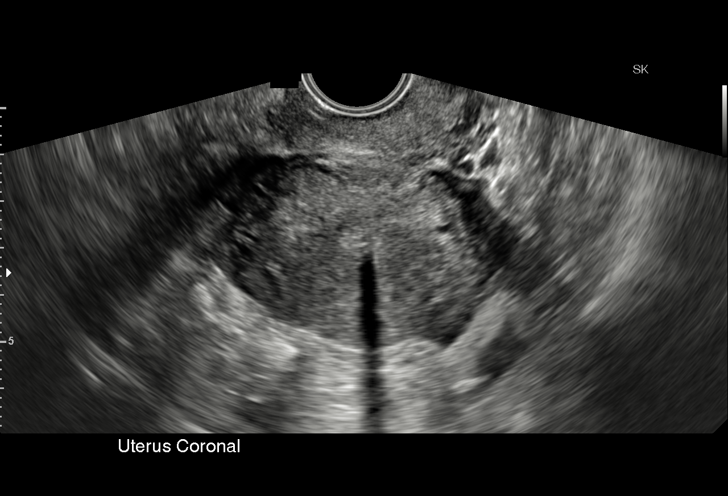
[im 33/53]
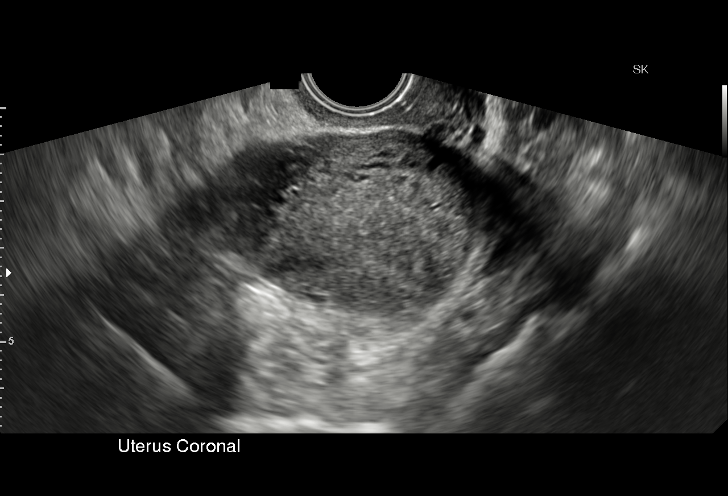
[im 37/53]
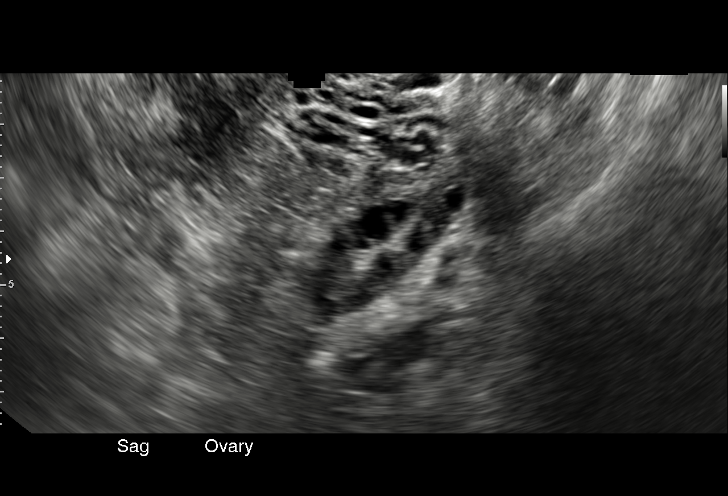
[im 42/53]
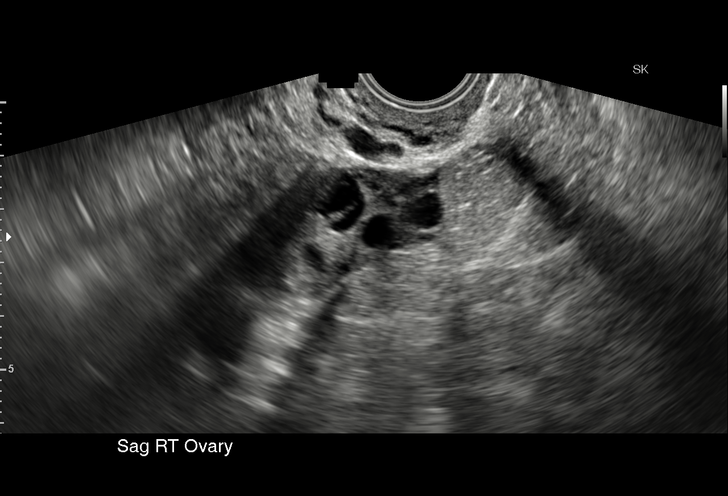
[im 44/53]
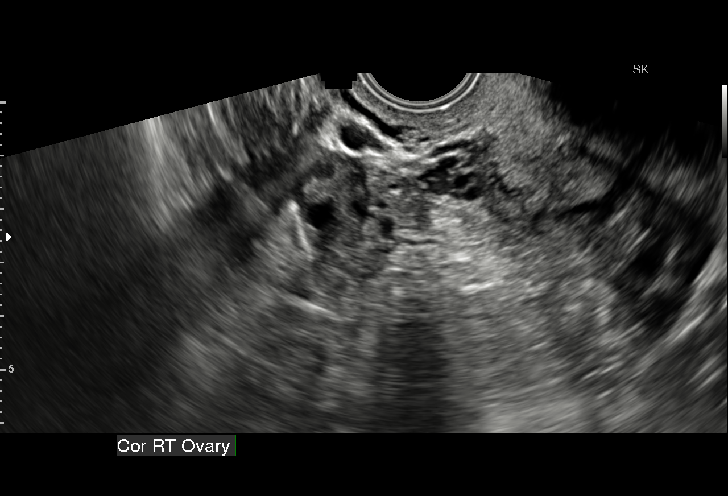
[im 48/53]
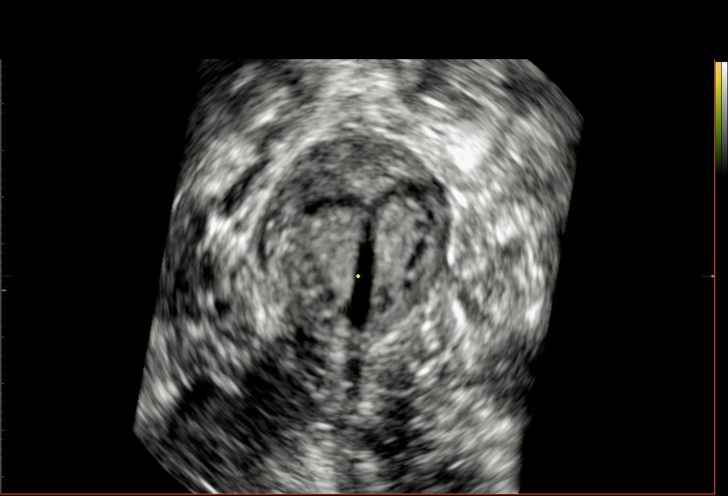
[im 53/53]
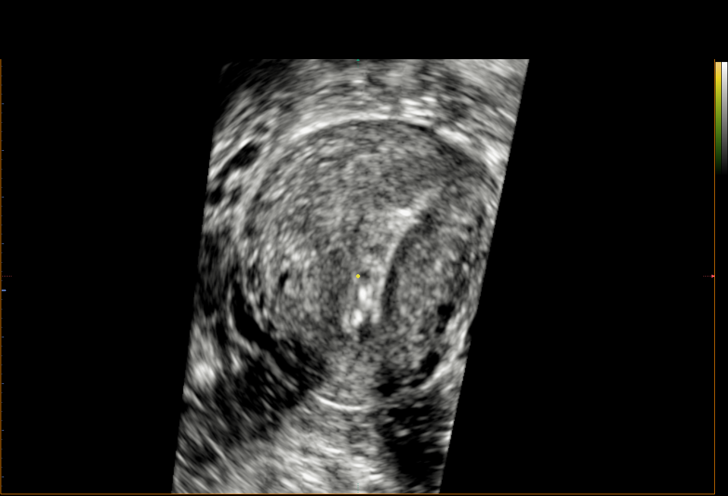

[15 of 25 positions shown; findings below may reference images not displayed]

FINDINGS: Uterus

Measurements: 7.4 x 4.5 x 5.3 cm. Retroverted. No fibroids or other
mass visualized.

Endometrium

Thickness: 6 mm. IUD new seen in expected location within the
endometrial cavity.

Right ovary

Measurements: 3.6 x 1.9 x 1.6 cm. Normal appearance/no adnexal mass.

Left ovary

Measurements: 3.9 x 1.6 x 2.1 cm. Normal appearance/no adnexal mass.

Other findings

Trace amount of free fluid in pelvic cul-de-sac.
IMPRESSION: IUD in expected location within endometrial cavity. No evidence of
uterine fibroids.

Normal appearance of both ovaries.  No adnexal mass identified.

## 2014-12-12 LAB — IPS N GONORRHOEA AND CHLAMYDIA BY PCR

## 2014-12-13 ENCOUNTER — Telehealth: Payer: Self-pay | Admitting: Obstetrics and Gynecology

## 2014-12-13 NOTE — Telephone Encounter (Signed)
-----   Message from Patton SallesBrook E Amundson C Silva, MD sent at 12/11/2014  8:02 PM EST ----- Results to patient through My Chart.  Please contact patient to be sure she received the results of her normal pelvic ultrasound.   She was considering removal of the IUD after results return.

## 2014-12-13 NOTE — Telephone Encounter (Signed)
Called patient to review benefits for pelvic ultrasound. Left voicemail to call back and review.

## 2014-12-13 NOTE — Telephone Encounter (Signed)
Spoke with patient. She has reviewed the message from Dr. Edward JollySilva. She states at this time she is still considering IUD removal.  Agrees to consult with Dr. Edward JollySilva to discuss. Scheduled for 01/02/15 at 1030. She is advised to return call prior to appointment as needed and is agreeable.  Routing to provider for final review. Patient agreeable to disposition. Will close encounter.

## 2014-12-13 NOTE — Telephone Encounter (Signed)
When patient returns call, please forward her call to Triage. Patient has completed ultrasound and needs results.

## 2015-01-02 ENCOUNTER — Ambulatory Visit (INDEPENDENT_AMBULATORY_CARE_PROVIDER_SITE_OTHER): Payer: BLUE CROSS/BLUE SHIELD | Admitting: Obstetrics and Gynecology

## 2015-01-02 ENCOUNTER — Encounter: Payer: Self-pay | Admitting: Obstetrics and Gynecology

## 2015-01-02 VITALS — BP 122/78 | HR 90 | Ht 66.5 in | Wt 207.8 lb

## 2015-01-02 DIAGNOSIS — Z8379 Family history of other diseases of the digestive system: Secondary | ICD-10-CM | POA: Diagnosis not present

## 2015-01-02 DIAGNOSIS — K921 Melena: Secondary | ICD-10-CM | POA: Diagnosis not present

## 2015-01-02 DIAGNOSIS — M25559 Pain in unspecified hip: Secondary | ICD-10-CM

## 2015-01-02 DIAGNOSIS — R631 Polydipsia: Secondary | ICD-10-CM

## 2015-01-02 LAB — BASIC METABOLIC PANEL
BUN: 12 mg/dL (ref 7–25)
CHLORIDE: 105 mmol/L (ref 98–110)
CO2: 20 mmol/L (ref 20–31)
CREATININE: 0.62 mg/dL (ref 0.50–1.10)
Calcium: 9 mg/dL (ref 8.6–10.2)
Glucose, Bld: 78 mg/dL (ref 65–99)
Potassium: 4.5 mmol/L (ref 3.5–5.3)
Sodium: 135 mmol/L (ref 135–146)

## 2015-01-02 NOTE — Progress Notes (Signed)
Patient ID: Wendy Molina, female   DOB: 05/18/1985, 29 y.o.   MRN: [id number] GYNECOLOGY  VISIT   HPI: 29 y.o.   Single  Caucasian  female   G0P0 with Patient's last menstrual period was 12/06/2014 (exact date).   here for consult regarding pelvic ultrasound results and whether she desires different form of birth control. What concerns the patient the most is her pain.  Thinks she wants to keep the IUD.   Skyla placed on 01/01/14.  Having periodic episodes of pelvic pain - February and then over the last 1.5 months.  Still having pain but is occurring 5 - 6 times per week. Having more frequent bowel movements.  For last few months is having MS every other day but going 2 - 3 times per day.  Occasionally has some blood in the stool.  Not straining.   No blood in urine. Occasional pain with urination.  None for one month.   [facility]/11/14 - IUD in canal.  Normal uterus.  Normal ovaries.   Other contraception used in past - OrthoEvra for many year.  Did not do well with generic but did fine with name brand.  Generic fell off. Difficulty finding the name brand.  No other problems with combined contraception.  Cannot swallow pills easily.   GC/CT both negative at last office visit.   States excessive thirst.  No frequent urination.  Some sweet cravings. No FH of diabetes.  FH of colitis in mother.   GYNECOLOGIC HISTORY: Patient's last menstrual period was 12/06/2014 (exact date). Contraception:Skyla IUD inserted 01-01-14 Menopausal hormone therapy: n/a Last mammogram: n/a Last pap smear: 08-14-14 Neg:Neg HR HPV        OB History    Gravida Para Term Preterm AB TAB SAB Ectopic Multiple Living   0                  Patient Active Problem List   Diagnosis Date Noted  . Migraine without aura     Past Medical History  Diagnosis Date  . Hyperlipidemia   . Headache(784.0) 2016    migraine w/o aura  . Migraines   . Broken foot 08/2014    left     Past Surgical History  Procedure Laterality Date  . Skyla iud      insertion 01-01-14    Current Outpatient Prescriptions  Medication Sig Dispense Refill  . adapalene (DIFFERIN) 0.1 % gel Apply topically at bedtime.    . benzoyl peroxide (BENZOYL PEROXIDE) 5 % external liquid Apply topically 2 (two) times daily.    . folic acid (FOLVITE) 1 MG tablet Take 1 mg by mouth daily.    Marland Kitchen. ibuprofen (ADVIL,MOTRIN) 100 MG/5ML suspension Takes 2Tsp prn pain  0  . Levonorgestrel (SKYLA) 13.5 MG IUD by Intrauterine route.    . Multiple Vitamin (MULTIVITAMIN) tablet Take 1 tablet by mouth daily.    Marland Kitchen. zolmitriptan (ZOMIG-ZMT) 2.5 MG disintegrating tablet Take 1 tablet (2.5 mg total) by mouth as needed for migraine (may take repeat dose 2 hours later x1). 10 tablet 5   No current facility-administered medications for this visit.     ALLERGIES: Review of patient's allergies indicates no known allergies.  Family History  Problem Relation Age of Onset  . Arthritis Mother   . Colitis Mother   . Heart disease Maternal Aunt     MI [id number]  . Lung cancer Maternal Grandmother     smoker  . Cancer Maternal Grandmother  kidney  . Leukemia Paternal Grandmother     unsure correct type of cancer  . Heart failure Paternal Grandmother   . Alzheimer's disease Maternal Grandfather   . Kidney failure Paternal Grandfather     ? dialysis    Social History   Social History  . Marital Status: Single    Spouse Name: N/A  . Number of Children: 0  . Years of Education: N/A   Occupational History  . Not on file.   Social History Main Topics  . Smoking status: Never Smoker   . Smokeless tobacco: Never Used  . Alcohol Use: 1.2 oz/week    2 Standard drinks or equivalent per week  . Drug Use: No  . Sexual Activity:    Partners: Male    Birth Control/ Protection: IUD     Comment: skyla--inserted 01-01-14   Other Topics Concern  . Not on file   Social History Narrative   Single- in a  relationship. No children. 3 cats.       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  PHYSICAL EXAMINATION:    BP 122/78 mmHg  Pulse 90  Ht 5' 6.5" (1.689 m)  Wt 207 lb 12.8 oz (94.257 kg)  BMI 33.04 kg/m2  LMP 12/06/2014 (Exact Date)    General appearance: alert, cooperative and appears stated age   ASSESSMENT  Pelvic pain.  Skyla IUD patient.   Normal pelvic ultrasound. Blood in stool.  FH colitis.  Polydipsia.  PLAN   Will continue with Skyla IUD. If has IUD out in future, Minastrin or NuvaRing may be good options.  Nexplanon is also a good choice.  Counseled regarding pelvic pain and potential etiologies.  Will refer to Dr. Loreta Ave for GI consult and colonoscopy to rule out colitis, polyps, endometriosis.  If GI work up is negative, CT of abdomen and pelvis. Check BMP. Follow up prn.  An After Visit Summary was printed and given to the patient.  __25____ minutes face to face time of which over 50% was spent in counseling.

## 2015-01-23 ENCOUNTER — Encounter: Payer: Self-pay | Admitting: Family Medicine

## 2015-01-23 ENCOUNTER — Ambulatory Visit (INDEPENDENT_AMBULATORY_CARE_PROVIDER_SITE_OTHER): Payer: BLUE CROSS/BLUE SHIELD | Admitting: Family Medicine

## 2015-01-23 VITALS — BP 132/87 | HR 109 | Temp 98.1°F | Resp 20 | Wt 208.8 lb

## 2015-01-23 DIAGNOSIS — J0100 Acute maxillary sinusitis, unspecified: Secondary | ICD-10-CM

## 2015-01-23 DIAGNOSIS — R000 Tachycardia, unspecified: Secondary | ICD-10-CM | POA: Diagnosis not present

## 2015-01-23 MED ORDER — DOXYCYCLINE HYCLATE 100 MG PO TABS: 100.0000 mg | ORAL_TABLET | Freq: Two times a day (BID) | ORAL | Status: DC

## 2015-01-23 MED ORDER — FLUTICASONE PROPIONATE 50 MCG/ACT NA SUSP: 2.0000 | Freq: Every day | NASAL | Status: DC

## 2015-01-23 NOTE — Progress Notes (Signed)
   Subjective:    Patient ID: Wendy Molina, female    DOB: 10/04/1985, 29 y.o.   MRN: [id number]  HPI  Cough: Patient presents with a greater than 5 day history of feeling fatigued, mildly short of breath, foggy headed, cough, mild body aches, ears feeling full, sinus pressure and nasal congestion. She states she does have a mild sore throat as well. She denies nausea, vomit, diarrhea, rash or myalgias. She denies headaches. Patient has not had her flu shot this year. She does not have a history of asthma. She states she was exposed to a coworker that was ill with similar symptoms. She has been taking a Quill, DayQuil and Advil without great relief of her symptoms. She states she is eating well, but she admits to not drinking much fluid.  Never smoker  Past Medical History  Diagnosis Date  . Hyperlipidemia   . Headache(784.0) 2016    migraine w/o aura  . Migraines   . Broken foot 08/2014    left   No Known Allergies  Review of Systems Negative, with the exception of above mentioned in HPI    Objective:   Physical Exam BP 132/87 mmHg  Pulse 109  Temp(Src) 98.1 F (36.7 C)  Resp 20  Wt 208 lb 12 oz (94.688 kg)  SpO2 97%  LMP 01/04/2015 Gen: Afebrile. No acute distress. Nontoxic in appearance, well-developed, well-nourished, Caucasian female. Very pleasant. HENT: AT. [place]. Bilateral TM visualized, without erythema or bulging. Full/fluid-filled membranes.. Mildly dry mucous membranes. No oral lesions.. Bilateral nares with severe erythema and swelling.. Throat without erythema or exudates. Mild cough on exam. No hoarseness on exam. Tenderness to palpation left maxillary sinus. Eyes:Pupils Equal Round Reactive to light, Extraocular movements intact,  Conjunctiva without redness, discharge or icterus. Neck/lymp/endocrine: Supple, bilateral anterior cervical lymphadenopathy CV: Mildly tachycardic, no murmurs appreciated. Chest: CTAB, no wheeze or crackles. Normal respiratory effort.  Good air movement. Abd: Soft. NTND. BS present. No Masses palpated.  Skin: No rashes, purpura or petechiae.   Assessment & Plan:  1. Acute maxillary sinusitis, recurrence not specified - Rest, hydrate, doxycycline, Flonase. Patient was encouraged to use a humidifier. Patient was strongly encouraged to increase her water intake daily. Watch for signs of dehydration. - doxycycline (VIBRA-TABS) 100 MG tablet; Take 1 tablet (100 mg total) by mouth 2 (two) times daily.  Dispense: 20 tablet; Refill: 0 - fluticasone (FLONASE) 50 MCG/ACT nasal spray; Place 2 sprays into both nostrils daily.  Dispense: 16 g; Refill: 6  2. Tachycardia (New) Patient was strongly encouraged to increase her water intake daily. Watch for signs of dehydration. Patient was afebrile on exam, has not taken recent antipyretic. Did not appear septic. Review of prior heart rates were normal. Discussed with patient the causes of increased heart rate, and patient is to monitor. Felt this is likely from mild dehydration.  Follow-up with PCP 1 week

## 2015-01-23 NOTE — Patient Instructions (Addendum)
Sinusitis, Adult Sinusitis is redness, soreness, and inflammation of the paranasal sinuses. Paranasal sinuses are air pockets within the bones of your face. They are located beneath your eyes, in the middle of your forehead, and above your eyes. In healthy paranasal sinuses, mucus is able to drain out, and air is able to circulate through them by way of your nose. However, when your paranasal sinuses are inflamed, mucus and air can become trapped. This can allow bacteria and other germs to grow and cause infection. Sinusitis can develop quickly and last only a short time (acute) or continue over a long period (chronic). Sinusitis that lasts for more than 12 weeks is considered chronic. CAUSES Causes of sinusitis include:  Allergies.  Structural abnormalities, such as displacement of the cartilage that separates your nostrils (deviated septum), which can decrease the air flow through your nose and sinuses and affect sinus drainage.  Functional abnormalities, such as when the small hairs (cilia) that line your sinuses and help remove mucus do not work properly or are not present. SIGNS AND SYMPTOMS Symptoms of acute and chronic sinusitis are the same. The primary symptoms are pain and pressure around the affected sinuses. Other symptoms include:  Upper toothache.  Earache.  Headache.  Bad breath.  Decreased sense of smell and taste.  A cough, which worsens when you are lying flat.  Fatigue.  Fever.  Thick drainage from your nose, which often is green and may contain pus (purulent).  Swelling and warmth over the affected sinuses. DIAGNOSIS Your health care provider will perform a physical exam. During your exam, your health care provider may perform any of the following to help determine if you have acute sinusitis or chronic sinusitis:  Look in your nose for signs of abnormal growths in your nostrils (nasal polyps).  Tap over the affected sinus to check for signs of  infection.  View the inside of your sinuses using an imaging device that has a light attached (endoscope). If your health care provider suspects that you have chronic sinusitis, one or more of the following tests may be recommended:  Allergy tests.  Nasal culture. A sample of mucus is taken from your nose, sent to a lab, and screened for bacteria.  Nasal cytology. A sample of mucus is taken from your nose and examined by your health care provider to determine if your sinusitis is related to an allergy. TREATMENT Most cases of acute sinusitis are related to a viral infection and will resolve on their own within 10 days. Sometimes, medicines are prescribed to help relieve symptoms of both acute and chronic sinusitis. These may include pain medicines, decongestants, nasal steroid sprays, or saline sprays. However, for sinusitis related to a bacterial infection, your health care provider will prescribe antibiotic medicines. These are medicines that will help kill the bacteria causing the infection. Rarely, sinusitis is caused by a fungal infection. In these cases, your health care provider will prescribe antifungal medicine. For some cases of chronic sinusitis, surgery is needed. Generally, these are cases in which sinusitis recurs more than 3 times per year, despite other treatments. HOME CARE INSTRUCTIONS  Drink plenty of water. Water helps thin the mucus so your sinuses can drain more easily.  Use a humidifier.  Inhale steam 3-4 times a day (for example, sit in the bathroom with the shower running).  Apply a warm, moist washcloth to your face 3-4 times a day, or as directed by your health care provider.  Use saline nasal sprays to help   moisten and clean your sinuses.  Take medicines only as directed by your health care provider.  If you were prescribed either an antibiotic or antifungal medicine, finish it all even if you start to feel better. SEEK IMMEDIATE MEDICAL CARE IF:  You have  increasing pain or severe headaches.  You have nausea, vomiting, or drowsiness.  You have swelling around your face.  You have vision problems.  You have a stiff neck.  You have difficulty breathing.   This information is not intended to replace advice given to you by your health care provider. Make sure you discuss any questions you have with your health care provider.   Document Released: 01/12/2005 Document Revised: 02/02/2014 Document Reviewed: 01/27/2011 Elsevier Interactive Patient Education 2016 [employer]  Flonase, doxycycline, use a humidifier and mucinex. Rest and hydrate.

## 2015-01-24 ENCOUNTER — Telehealth: Payer: Self-pay | Admitting: Obstetrics and Gynecology

## 2015-01-24 NOTE — Telephone Encounter (Signed)
Labs reviewed from Dr Loreta AveMann. Normal LFT's, TSH, and CBC

## 2015-02-11 ENCOUNTER — Encounter: Payer: Self-pay | Admitting: Family Medicine

## 2015-02-11 ENCOUNTER — Ambulatory Visit (INDEPENDENT_AMBULATORY_CARE_PROVIDER_SITE_OTHER): Payer: BLUE CROSS/BLUE SHIELD | Admitting: Family Medicine

## 2015-02-11 DIAGNOSIS — G43009 Migraine without aura, not intractable, without status migrainosus: Secondary | ICD-10-CM

## 2015-02-11 LAB — VITAMIN B12: Vitamin B-12: 515 pg/mL (ref 211–911)

## 2015-02-11 MED ORDER — AMITRIPTYLINE HCL 25 MG PO TABS: ORAL_TABLET | ORAL | Status: DC

## 2015-02-11 NOTE — Progress Notes (Signed)
Subjective:    Patient ID: Wendy Molina, female    DOB: 08/01/1985, 30 y.o.   MRN: [name]  HPI   Migraine: Patient presents for evaluation of increase in migraine headaches over the last "few months. "She states that she's had similar headaches years ago, but they have gone away on their own. Pain is located more on the left side of her head, but sometimes located on the right. She describes the pain as a stabbing shooting pain sometimes in the frontal area and sometimes in her occipital area. She endorses "confusion" by sometimes reading things backwards when she has a headache making it difficult to focus. She has had ringing in the ears in the past, not pulsatile with headaches. tell with He sharp pains will sometimes progress to a dull ache. Zomig has helped with her headaches in the past. Laying down in a dark room/sleeping also has improved her headaches. Patient has endorsed nausea, chest tightness and also occasionally occurs with her headaches. She rates the sharp pain lasts about 5-10 seconds, and then headache will be throbbing for hours throughout the day. She believes she is now getting as many as 2 week. She sometimes can get the sharp pain every couple of days, without headache to follow. She states when she has a migraine it can last up to about 6 hours on average. 6 hours on average.  Patient states some of her triggers may be poor sleep or stress. Currently she is getting ready to go back to school and has been filling out all of the paperwork that needs to be completed for that and has been stressing her out. She states all this work is now complete.    Past Medical History  Diagnosis Date  . Hyperlipidemia   . Headache(784.0) 2016    migraine w/o aura  . Migraines   . Broken foot 08/2014    left   No Known Allergies Social History   Social History  . Marital Status: Single    Spouse Name: N/A  . Number of Children: 0  . Years of Education: N/A    Occupational History  . Not on file.   Social History Main Topics  . Smoking status: Never Smoker   . Smokeless tobacco: Never Used  . Alcohol Use: 1.2 oz/week    2 Standard drinks or equivalent per week  . Drug Use: No  . Sexual Activity:    Partners: Male    Birth Control/ Protection: IUD     Comment: skyla--inserted 01-01-14   Other Topics Concern  . Not on file   Social History Narrative   Single- in a relationship. No children. 3 cats.       Works for [employer]- [employer] serv[employer]      Hobbies: reading, going to concerts, trying to get into hiking   Family History  Problem Relation Age of Onset  . Arthritis Mother   . Colitis Mother   . Heart disease Maternal Aunt     MI [id number]  . Lung cancer Maternal Grandmother     smoker  . Cancer Maternal Grandmother     kidney  . Leukemia Paternal Grandmother     unsure correct type of cancer  . Heart failure Paternal Grandmother   . Alzheimer's disease Maternal Grandfather   . Kidney failure Paternal Grandfather     ? dialysis   Review of Systems Negative, with the exception of above mentioned in HPI    Objective:  Physical Exam BP 111/79 mmHg  Pulse 92  Temp(Src) 98 F (36.7 C) (Oral)  Resp 20  Wt 208 lb 8 oz (94.575 kg)  SpO2 94%  LMP 02/04/2015  Body mass index is 33.15 kg/(m^2). Gen: Afebrile. No acute distress. Nontoxic in appearance, well-developed, well-nourished, Caucasian female. Currently without headache. HENT: AT. [place]. Bilateral TM visualized and normal in appearance. MMM, no oral lesions. Bilateral nares without erythema or swelling. Throat without erythema or exudates.  Eyes:Pupils Equal Round Reactive to light, Extraocular movements intact,  Conjunctiva without redness, discharge or icterus. Neck/lymp/endocrine: Supple, no lymphadenopathy, no thyromegaly CV: RRR , no edema, +2/4 P posterior tibialis pulses Chest: CTAB, no wheeze or crackles Abd: Soft. Overweight. NTND. BS present. No  Masses palpated.  Skin: No rashes, purpura or petechiae.  Neuro/MSK: Normal gait. PERLA. EOMi. Alert. Oriented x3. Cranial nerves II through XII intact. Muscle strength 5/5 upper and lower extremity. Face symmetric. Uvula midline. DTRs equal bilaterally. Psych: Normal affect, dress and demeanor. Normal speech. Normal thought content and judgment.    Assessment & Plan:  1. Migraine without aura and without status migrainosus, not intractable - We'll complete lab work today B-12, RPR, prolactin. TSH was value is normal. - Discussed different therapies with patient today and she is amendable to trying amitriptyline daily at bedtime for headaches. Patient advised of taper dose. - Consider MRI - amitriptyline (ELAVIL) 25 MG tablet; 25 mg qhs 1 week, then 50 mg qhs  Dispense: 60 tablet; Refill: 1

## 2015-02-11 NOTE — Patient Instructions (Signed)
Migraine Headache A migraine headache is an intense, throbbing pain on one or both sides of your head. A migraine can last for 30 minutes to several hours. CAUSES  The exact cause of a migraine headache is not always known. However, a migraine may be caused when nerves in the brain become irritated and release chemicals that cause inflammation. This causes pain. Certain things may also trigger migraines, such as:  Alcohol.  Smoking.  Stress.  Menstruation.  Aged cheeses.  Foods or drinks that contain nitrates, glutamate, aspartame, or tyramine.  Lack of sleep.  Chocolate.  Caffeine.  Hunger.  Physical exertion.  Fatigue.  Medicines used to treat chest pain (nitroglycerine), birth control pills, estrogen, and some blood pressure medicines. SIGNS AND SYMPTOMS  Pain on one or both sides of your head.  Pulsating or throbbing pain.  Severe pain that prevents daily activities.  Pain that is aggravated by any physical activity.  Nausea, vomiting, or both.  Dizziness.  Pain with exposure to bright lights, loud noises, or activity.  General sensitivity to bright lights, loud noises, or smells. Before you get a migraine, you may get warning signs that a migraine is coming (aura). An aura may include:  Seeing flashing lights.  Seeing bright spots, halos, or zigzag lines.  Having tunnel vision or blurred vision.  Having feelings of numbness or tingling.  Having trouble talking.  Having muscle weakness. DIAGNOSIS  A migraine headache is often diagnosed based on:  Symptoms.  Physical exam.  A CT scan or MRI of your head. These imaging tests cannot diagnose migraines, but they can help rule out other causes of headaches. TREATMENT Medicines may be given for pain and nausea. Medicines can also be given to help prevent recurrent migraines.  HOME CARE INSTRUCTIONS  Only take over-the-counter or prescription medicines for pain or discomfort as directed by your  health care provider. The use of long-term narcotics is not recommended.  Lie down in a dark, quiet room when you have a migraine.  Keep a journal to find out what may trigger your migraine headaches. For example, write down:  What you eat and drink.  How much sleep you get.  Any change to your diet or medicines.  Limit alcohol consumption.  Quit smoking if you smoke.  Get 7-9 hours of sleep, or as recommended by your health care provider.  Limit stress.  Keep lights dim if bright lights bother you and make your migraines worse. SEEK IMMEDIATE MEDICAL CARE IF:   Your migraine becomes severe.  You have a fever.  You have a stiff neck.  You have vision loss.  You have muscular weakness or loss of muscle control.  You start losing your balance or have trouble walking.  You feel faint or pass out.  You have severe symptoms that are different from your first symptoms. MAKE SURE YOU:   Understand these instructions.  Will watch your condition.  Will get help right away if you are not doing well or get worse.   This information is not intended to replace advice given to you by your health care provider. Make sure you discuss any questions you have with your health care provider.   Document Released: 01/12/2005 Document Revised: 02/02/2014 Document Reviewed: 09/19/2012 Elsevier Interactive Patient Education 2016 [employer].  Will start amitriptyline 25 mg before bed for 1 week, then increase to 50 mg before bed. I will then need to see you for follow up and decided to increase medicine or leave  at that dose.

## 2015-02-12 ENCOUNTER — Other Ambulatory Visit (INDEPENDENT_AMBULATORY_CARE_PROVIDER_SITE_OTHER): Payer: BLUE CROSS/BLUE SHIELD

## 2015-02-12 ENCOUNTER — Telehealth: Payer: Self-pay | Admitting: Family Medicine

## 2015-02-12 DIAGNOSIS — G43009 Migraine without aura, not intractable, without status migrainosus: Secondary | ICD-10-CM

## 2015-02-12 DIAGNOSIS — E559 Vitamin D deficiency, unspecified: Secondary | ICD-10-CM

## 2015-02-12 LAB — VITAMIN D 25 HYDROXY (VIT D DEFICIENCY, FRACTURES): VITD: 22.08 ng/mL — AB (ref 30.00–100.00)

## 2015-02-12 LAB — RPR

## 2015-02-12 LAB — PROLACTIN: PROLACTIN: 11.9 ng/mL

## 2015-02-12 MED ORDER — CHOLECALCIFEROL 1.25 MG (50000 UT) PO CAPS: 50000.0000 [IU] | ORAL_CAPSULE | ORAL | Status: DC

## 2015-02-12 NOTE — Telephone Encounter (Signed)
Left message with results on patient voice mail 

## 2015-02-12 NOTE — Telephone Encounter (Signed)
Please call pt: - all of her labs are normal.  - I am waiting for her vit d, however I am out of the office for two days, so wanted her to have the results that are currently available. If Vit D abnl will notify her Friday.

## 2015-02-12 NOTE — Telephone Encounter (Signed)
Please call patient, her vitamin D is low at 22. Have called in a prescribe supplement for her to take 2 times a week for 12 weeks, and then we will need to retest her levels.

## 2015-02-13 NOTE — Telephone Encounter (Signed)
Spoke with patient reviewed results and instructions. 

## 2015-02-15 ENCOUNTER — Ambulatory Visit (INDEPENDENT_AMBULATORY_CARE_PROVIDER_SITE_OTHER): Payer: BLUE CROSS/BLUE SHIELD | Admitting: Nurse Practitioner

## 2015-02-15 ENCOUNTER — Encounter: Payer: Self-pay | Admitting: Nurse Practitioner

## 2015-02-15 VITALS — BP 110/74 | HR 72 | Ht 66.5 in | Wt 210.0 lb

## 2015-02-15 DIAGNOSIS — R635 Abnormal weight gain: Secondary | ICD-10-CM

## 2015-02-15 NOTE — Patient Instructions (Addendum)
  Advise Weight Watchers.

## 2015-02-15 NOTE — Progress Notes (Signed)
30 y.o. Single Caucasian female G0P0 here for consult visit to follow up on weight gain.  Her weight is normally 200 lbs in July 2016.  The Skyla IUD was inserted 12/2013.  She now feels the extra weight gain may be from the IUD.  There has been a change in fast foods more recently but exercise has remained the same.  She has also noted some increase in acne and is using topical treatments but concerned since it is more cystic in nature.  Her irregular bleeding is a little better with now only spotting every 2-3 months that last 5-6 days and uses a mini pad. LMP: 02/04/2015   O: Healthy WD,WN female Affect: normal  Current weight: 210 lbs.    A: Weight gain  Skyla IUD 01/01/2014    P:  Discussed increasing cardio and reduction of fast foods  Advise Weight Watchers program  Will keep a menses calendar  Continue with treatment for acne    Instructions given regarding:    Since bleeding pattern are better she would like to keep IUD for now.  Will try to work on acne and weight  RV  Consult visit:  15 minutes

## 2015-02-18 ENCOUNTER — Encounter: Payer: Self-pay | Admitting: Nurse Practitioner

## 2015-02-22 NOTE — Progress Notes (Signed)
Encounter reviewed by Dr. Sapphira Harjo Amundson C. Silva.  

## 2015-03-11 ENCOUNTER — Ambulatory Visit (INDEPENDENT_AMBULATORY_CARE_PROVIDER_SITE_OTHER): Payer: BLUE CROSS/BLUE SHIELD | Admitting: Family Medicine

## 2015-03-11 ENCOUNTER — Encounter: Payer: Self-pay | Admitting: Family Medicine

## 2015-03-11 VITALS — BP 123/86 | HR 87 | Temp 98.2°F | Resp 20 | Wt 211.2 lb

## 2015-03-11 DIAGNOSIS — E559 Vitamin D deficiency, unspecified: Secondary | ICD-10-CM | POA: Diagnosis not present

## 2015-03-11 DIAGNOSIS — G43009 Migraine without aura, not intractable, without status migrainosus: Secondary | ICD-10-CM | POA: Diagnosis not present

## 2015-03-11 MED ORDER — AMITRIPTYLINE HCL 50 MG PO TABS: 50.0000 mg | ORAL_TABLET | Freq: Every day | ORAL | Status: DC

## 2015-03-11 NOTE — Patient Instructions (Signed)
Vitamin D Deficiency Vitamin D deficiency is when your body does not have enough vitamin D. Vitamin D is important to your body for many reasons:  It helps the body to absorb two important minerals, called calcium and phosphorus.  It plays a role in bone health.  It may help to prevent some diseases, such as diabetes and multiple sclerosis.  It plays a role in muscle function, including heart function. You can get vitamin D by:  Eating foods that naturally contain vitamin D.  Eating or drinking milk or other dairy products that have vitamin D added to them.  Taking a vitamin D supplement or a multivitamin supplement that contains vitamin D.  Being in the sun. Your body naturally makes vitamin D when your skin is exposed to sunlight. Your body changes the sunlight into a form of the vitamin that the body can use. If vitamin D deficiency is severe, it can cause a condition in which your bones become soft. In adults, this condition is called osteomalacia. In children, this condition is called rickets. CAUSES Vitamin D deficiency may be caused by:  Not eating enough foods that contain vitamin D.  Not getting enough sun exposure.  Having certain digestive system diseases that make it difficult for your body to absorb vitamin D. These diseases include Crohn disease, chronic pancreatitis, and cystic fibrosis.  Having a surgery in which a part of the stomach or a part of the small intestine is removed.  Being obese.  Having chronic kidney disease or liver disease. RISK FACTORS This condition is more likely to develop in:  Older people.  People who do not spend much time outdoors.  People who live in a long-term care facility.  People who have had broken bones.  People with weak or thin bones (osteoporosis).  People who have a disease or condition that changes how the body absorbs vitamin D.  People who have dark skin.  People who take certain medicines, such as steroid  medicines or certain seizure medicines.  People who are overweight or obese. SYMPTOMS In mild cases of vitamin D deficiency, there may not be any symptoms. If the condition is severe, symptoms may include:  Bone pain.  Muscle pain.  Falling often.  Broken bones caused by a minor injury. DIAGNOSIS This condition is usually diagnosed with a blood test.  TREATMENT Treatment for this condition may depend on what caused the condition. Treatment options include:  Taking vitamin D supplements.  Taking a calcium supplement. Your health care provider will suggest what dose is best for you. HOME CARE INSTRUCTIONS  Take medicines and supplements only as told by your health care provider.  Eat foods that contain vitamin D. Choices include:  Fortified dairy products, cereals, or juices. Fortified means that vitamin D has been added to the food. Check the label on the package to be sure.  Fatty fish, such as salmon or trout.  Eggs.  Oysters.  Do not use a tanning bed.  Maintain a healthy weight. Lose weight, if needed.  Keep all follow-up visits as told by your health care provider. This is important. SEEK MEDICAL CARE IF:  Your symptoms do not go away.  You feel like throwing up (nausea) or you throw up (vomit).  You have fewer bowel movements than usual or it is difficult for you to have a bowel movement (constipation).   This information is not intended to replace advice given to you by your health care provider. Make sure you discuss   any questions you have with your health care provider.   Document Released: 04/06/2011 Document Revised: 10/03/2014 Document Reviewed: 05/30/2014 Elsevier Interactive Patient Education 2016 Elsevier Inc.  

## 2015-03-11 NOTE — Progress Notes (Signed)
Patient ID: Wendy Molina, female   DOB: July 31, 1985, 31 y.o.   MRN: [id number]   Subjective:    Patient ID: Wendy Molina, female    DOB: 07-18-1985, 30 y.o.   MRN: [id number]  HPI  Patient presents for scheduled office visit follow-up on migraine headaches and low vitamin D.  Migraine: Patient was started on amitriptyline taper, she is now taking 15 mg daily at bedtime for her migraine headaches. She states she has only had one very small migraine headache since her last office visit. She states she went out of town and did forget her medication during that time. She reports she is sleeping much better, without oversedation.   Vitamin D deficiency: Patient was found to have vitamin D level of 22. She has been taking 50,000 units weekly as prescribed without side effects.  Prior office note 02/11/2015: Migraine: Patient presents for evaluation of increase in migraine headaches over the last "few months. "She states that she's had similar headaches years ago, but they have gone away on their own. Pain is located more on the left side of her head, but sometimes located on the right. She describes the pain as a stabbing shooting pain sometimes in the frontal area and sometimes in her occipital area. She endorses "confusion" by sometimes reading things backwards when she has a headache making it difficult to focus. She has had ringing in the ears in the past, not pulsatile with headaches. tell with He sharp pains will sometimes progress to a dull ache. Zomig has helped with her headaches in the past. Laying down in a dark room/sleeping also has improved her headaches. Patient has endorsed nausea, chest tightness and also occasionally occurs with her headaches. She rates the sharp pain lasts about 5-10 seconds, and then headache will be throbbing for hours throughout the day. She believes she is now getting as many as 2 week. She sometimes can get the sharp pain every couple of days, without headache  to follow. She states when she has a migraine it can last up to about 6 hours on average. 6 hours on average.  Patient states some of her triggers may be poor sleep or stress. Currently she is getting ready to go back to school and has been filling out all of the paperwork that needs to be completed for that and has been stressing her out. She states all this work is now complete.    Past Medical History  Diagnosis Date  . Hyperlipidemia   . Headache(784.0) 2016    migraine w/o aura  . Migraines   . Broken foot 08/2014    left   No Known Allergies Social History   Social History  . Marital Status: Single    Spouse Name: N/A  . Number of Children: 0  . Years of Education: N/A   Occupational History  . Not on file.   Social History Main Topics  . Smoking status: Never Smoker   . Smokeless tobacco: Never Used  . Alcohol Use: 1.2 oz/week    2 Standard drinks or equivalent per week  . Drug Use: No  . Sexual Activity:    Partners: Male    Birth Control/ Protection: IUD     Comment: skyla--inserted 01-01-14   Other Topics Concern  . Not on file   Social History Narrative   Single- in a relationship. No children. 3 cats.       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 Arthritis Mother   . Colitis Mother   . Heart disease Maternal Aunt     MI [id number]  . Lung cancer Maternal Grandmother     smoker  . Cancer Maternal Grandmother     kidney  . Leukemia Paternal Grandmother     unsure correct type of cancer  . Heart failure Paternal Grandmother   . Alzheimer's disease Maternal Grandfather   . Kidney failure Paternal Grandfather     ? dialysis   Review of Systems Negative, with the exception of above mentioned in HPI    Objective:   Physical Exam BP 123/86 mmHg  Pulse 87  Temp(Src) 98.2 F (36.8 C)  Resp 20  Wt 211 lb 4 oz (95.822 kg)   SpO2 95%  LMP 02/04/2015 (Exact Date)  Body mass index is 33.59 kg/(m^2). Gen: Afebrile. No acute distress. Nontoxic in appearance, well-developed, well-nourished, Caucasian female. HENT: AT. [place].MMM, no oral lesions.  Eyes:Pupils Equal Round Reactive to light, Extraocular movements intact,  Conjunctiva without redness, discharge or icterus. CV: RRR  Neuro/MSK: Normal gait. PERLA. EOMi. Alert. Oriented x3.  Psych: Normal affect, dress and demeanor. Normal speech. Normal thought content and judgment.    Assessment & Plan:  1. Migraine without aura and without status migrainosus, not intractable -  B-12, RPR, prolactin, TSH all normal. - amitriptyline 50 mg QHS, seems to be working well for her, follow 4- 6 months.   2. Vitamin D:  - Continue current supplementation for 12 weeks duration. - We'll recheck vitamin D level after supplementation been completed. Patient advised to take at least 600- 1000 units vitamin D daily over-the-counter after prescribed supplementation has been completed.

## 2015-04-24 ENCOUNTER — Encounter: Payer: Self-pay | Admitting: Family Medicine

## 2015-04-24 ENCOUNTER — Ambulatory Visit (INDEPENDENT_AMBULATORY_CARE_PROVIDER_SITE_OTHER): Payer: BLUE CROSS/BLUE SHIELD | Admitting: Family Medicine

## 2015-04-24 VITALS — BP 129/84 | HR 102 | Temp 98.2°F | Resp 20 | Wt 212.5 lb

## 2015-04-24 DIAGNOSIS — T148XXA Other injury of unspecified body region, initial encounter: Secondary | ICD-10-CM

## 2015-04-24 DIAGNOSIS — T148 Other injury of unspecified body region: Secondary | ICD-10-CM | POA: Diagnosis not present

## 2015-04-24 MED ORDER — CEPHALEXIN 500 MG PO CAPS: 500.0000 mg | ORAL_CAPSULE | Freq: Two times a day (BID) | ORAL | Status: DC

## 2015-04-24 NOTE — Patient Instructions (Signed)
Chemical Burn Many chemicals can burn the skin. A chemical burn should be flushed with cool water and checked by an emergency caregiver. Your skin is a natural barrier to infection. It is the largest organ of your body. Burns damage this natural protection. To help prevent infection, it is very important to follow your caregiver's instructions in the care of your burn.  Many industrial chemicals may cause burns. These chemicals include acids, alkalis, and organic compounds such as petroleum, phenol, bitumen, tar, and grease. When acids come in contact with the skin, they cause an immediate change in the skin.Acid burns produce significant pain and form a scab (eschar). Usually, the immediate skin changes are the only damage from an acid burn.However, exposure to formic acid, chromic acid, or hydrofluoric acid may affect the whole body and may even be life-threatening. Alkalis include lye, cement, lime, and many chemicals with "hydroxide" in their name.An alkali burn may be less apparent than an acid burn at first. However, alkalis may cause greater tissue damage.It is important to be aware of any chemicals you are using. Treat any exposure to skin, eyes, or mucous membranes (nose, mouth, throat) as a potential emergency. PREVENTION  Avoid exposure to toxic chemicals that can cause burns.  Store chemicals out of the reach of children.  Use protective gloves when handling dangerous chemicals. HOME CARE INSTRUCTIONS   Wash your hands well before changing your bandage.  Change your bandage as often as directed by your caregiver.  Remove the old bandage. If the bandage sticks, you may soak it off with cool, clean water.  Cleanse the burn thoroughly but gently with mild soap and water.  Pat the area dry with a clean, dry cloth.  Apply a thin layer of antibacterial cream to the burn.  Apply a clean bandage as instructed by your caregiver.  Keep the bandage as clean and dry as  possible.  Elevate the affected area for the first 24 hours, then as instructed by your caregiver.  Only take over-the-counter or prescription medicines for pain, discomfort, or fever as directed by your caregiver.  Keep all follow-up appointments.This is important. This is how your caregiver can tell if your treatment is working. SEEK IMMEDIATE MEDICAL CARE IF:   You develop excessive pain.  You develop redness, tenderness, swelling, or red streaks near the burn.  The burned area develops yellowish-white fluid (pus) or a bad smell.  You have a fever. MAKE SURE YOU:   Understand these instructions.  Will watch your condition.  Will get help right away if you are not doing well or get worse.   This information is not intended to replace advice given to you by your health care provider. Make sure you discuss any questions you have with your health care provider.   Document Released: 10/19/2003 Document Revised: 04/06/2011 Document Reviewed: 07/16/2014 Elsevier Interactive Patient Education 2016 Elsevier Inc.  
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
## 2015-04-25 ENCOUNTER — Telehealth: Payer: Self-pay | Admitting: Family Medicine

## 2015-04-25 DIAGNOSIS — W2210XA Striking against or struck by unspecified automobile airbag, initial encounter: Secondary | ICD-10-CM | POA: Insufficient documentation

## 2015-04-25 DIAGNOSIS — T148XXA Other injury of unspecified body region, initial encounter: Secondary | ICD-10-CM

## 2015-04-25 MED ORDER — CEPHALEXIN 125 MG/5ML PO SUSR: 500.0000 mg | Freq: Two times a day (BID) | ORAL | Status: DC

## 2015-04-25 MED ORDER — SILVER SULFADIAZINE 1 % EX CREA
1.0000 | TOPICAL_CREAM | Freq: Every day | CUTANEOUS | Status: DC
Start: 2015-04-25 — End: 2015-09-04

## 2015-04-25 NOTE — Telephone Encounter (Signed)
Pt wants to know if there is another antibiotic she can take? The pills that were prescribed are too large for her to swallow. She would like a liquid medicine if possible. Also, [facility] told her that [facility] requires a Rx.

## 2015-04-25 NOTE — Telephone Encounter (Signed)
Left message for patient with information on medications.
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
## 2015-05-08 ENCOUNTER — Other Ambulatory Visit (INDEPENDENT_AMBULATORY_CARE_PROVIDER_SITE_OTHER): Payer: BLUE CROSS/BLUE SHIELD

## 2015-05-08 DIAGNOSIS — E559 Vitamin D deficiency, unspecified: Secondary | ICD-10-CM | POA: Diagnosis not present

## 2015-05-09 LAB — VITAMIN D 25 HYDROXY (VIT D DEFICIENCY, FRACTURES): VITD: 77.15 ng/mL (ref 30.00–100.00)

## 2015-05-13 ENCOUNTER — Telehealth: Payer: Self-pay | Admitting: Family Medicine

## 2015-05-13 NOTE — Telephone Encounter (Signed)
Please call pt: - her vitamin D is great (77). She can take an over the counter supplement (800 u daily)

## 2015-05-13 NOTE — Telephone Encounter (Signed)
Spoke with patient reviewed lab results and instructions. Patient verbalized understanding. 

## 2015-07-15 DIAGNOSIS — M25572 Pain in left ankle and joints of left foot: Secondary | ICD-10-CM | POA: Diagnosis not present

## 2015-07-23 DIAGNOSIS — M25572 Pain in left ankle and joints of left foot: Secondary | ICD-10-CM | POA: Diagnosis not present

## 2015-07-26 DIAGNOSIS — M25572 Pain in left ankle and joints of left foot: Secondary | ICD-10-CM | POA: Diagnosis not present

## 2015-07-27 DIAGNOSIS — M84375A Stress fracture, left foot, initial encounter for fracture: Secondary | ICD-10-CM

## 2015-07-27 HISTORY — DX: Stress fracture, left foot, initial encounter for fracture: M84.375A

## 2015-08-26 ENCOUNTER — Ambulatory Visit: Payer: BLUE CROSS/BLUE SHIELD | Admitting: Nurse Practitioner

## 2015-08-28 ENCOUNTER — Ambulatory Visit: Payer: BLUE CROSS/BLUE SHIELD | Admitting: Nurse Practitioner

## 2015-09-04 ENCOUNTER — Ambulatory Visit (INDEPENDENT_AMBULATORY_CARE_PROVIDER_SITE_OTHER): Payer: BLUE CROSS/BLUE SHIELD | Admitting: Nurse Practitioner

## 2015-09-04 ENCOUNTER — Encounter: Payer: Self-pay | Admitting: Nurse Practitioner

## 2015-09-04 VITALS — BP 118/80 | HR 80 | Ht 66.0 in | Wt 226.0 lb

## 2015-09-04 DIAGNOSIS — Z0000 Encounter for general adult medical examination without abnormal findings: Secondary | ICD-10-CM

## 2015-09-04 DIAGNOSIS — Z01419 Encounter for gynecological examination (general) (routine) without abnormal findings: Secondary | ICD-10-CM

## 2015-09-04 DIAGNOSIS — Z975 Presence of (intrauterine) contraceptive device: Secondary | ICD-10-CM

## 2015-09-04 LAB — POCT URINALYSIS DIPSTICK
BILIRUBIN UA: NEGATIVE
Glucose, UA: NEGATIVE
Ketones, UA: NEGATIVE
LEUKOCYTES UA: NEGATIVE
NITRITE UA: NEGATIVE
PH UA: 5.5
PROTEIN UA: NEGATIVE
RBC UA: NEGATIVE
Urobilinogen, UA: NEGATIVE

## 2015-09-04 NOTE — Progress Notes (Signed)
Patient ID: Wendy Molina, female   DOB: 09/19/1985, 30 y.o.   MRN: [id number]  30 y.o. G0P0000 Single  Caucasian Fe here for annual exam.  Menses are regular and last 1 day.  Some cramps 1 week prior.  Likes IUD but does not like the cramps.  Pain is relieved for the most part with OTC NSAID's.  Same partner 1.5 yrs.  Patient's last menstrual period was 08/05/2015 (exact date).          Sexually active: Yes.   Same partner x 1.5 years. The current method of family planning is condoms never and IUD. 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/01/14. Exercising: No.  The patient does not participate in regular exercise at present. Smoker:  no  Health Maintenance: Pap: 08/14/14, Negative with neg HR HPV TDaP:  06/22/12 Gardasil:  1 injection only 07/2010 HIV: 04/06/14 Labs: HB: 14.1  Urine: Negative    reports that she has never smoked. She has never used smokeless tobacco. She reports that she drinks about 1.2 oz of alcohol per week . She reports that she does not use drugs.  Past Medical History:  Diagnosis Date  . Broken foot 08/2014   left  . Headache(784.0) 2016   migraine w/o aura  . Hyperlipidemia   . Migraines     Past Surgical History:  Procedure Laterality Date  . INTRAUTERINE DEVICE (IUD) INSERTION     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/01/14    Current Outpatient Prescriptions  Medication Sig Dispense Refill  . adapalene (DIFFERIN) 0.1 % gel Apply topically at bedtime.    Marland Kitchen. amitriptyline (ELAVIL) 50 MG tablet Take 1 tablet (50 mg total) by mouth at bedtime. 30 tablet 2  . benzoyl peroxide (BENZOYL PEROXIDE) 5 % external liquid Apply topically 2 (two) times daily.    . cephALEXin (KEFLEX) 125 MG/5ML suspension Take 20 mLs (500 mg total) by mouth 2 (two) times daily. 280 mL 0  . fluticasone (FLONASE) 50 MCG/ACT nasal spray Place 2 sprays into both nostrils daily. 16 g 6  . folic acid (FOLVITE) 1 MG tablet Take 1 mg by mouth daily.    Marland Kitchen. ibuprofen (ADVIL,MOTRIN) 100 MG/5ML suspension Takes 2Tsp prn pain  0  .  Levonorgestrel (SKYLA) 13.5 MG IUD by Intrauterine route.    . Multiple Vitamin (MULTIVITAMIN) tablet Take 1 tablet by mouth daily.    . silver sulfADIAZINE (SILVADENE) 1 % cream Apply 1 application topically daily. 50 g 0  . zolmitriptan (ZOMIG-ZMT) 2.5 MG disintegrating tablet Take 1 tablet (2.5 mg total) by mouth as needed for migraine (may take repeat dose 2 hours later x1). 10 tablet 5   No current facility-administered medications for this visit.     Family History  Problem Relation Age of Onset  . Arthritis Mother   . Colitis Mother   . Lung cancer Maternal Grandmother     smoker  . Cancer Maternal Grandmother     kidney  . Leukemia Paternal Grandmother     unsure correct type of cancer  . Heart failure Paternal Grandmother   . Alzheimer's disease Maternal Grandfather   . Kidney failure Paternal Grandfather     ? dialysis  . Heart disease Maternal Aunt     MI 55-56    ROS:  Pertinent items are noted in HPI.  Otherwise, a comprehensive ROS was negative.  Exam:   BP 118/80 (BP Location: Right Arm, Patient Position: Sitting, Cuff Size: Large)   Pulse 80   Ht 5\' 6"  (1.676 m)   Wt  226 lb (102.5 kg)   LMP 08/05/2015 (Exact Date)   BMI 36.48 kg/m  Height:  (167.6 cm) Ht Readings from Last 3 Encounters:  09/04/15  (1.676 m)  02/15/15 5' 6.5" (1.689 m)  01/02/15 5' 6.5" (1.689 m)    General appearance: alert, cooperative and appears stated age Head: Normocephalic, without obvious abnormality, atraumatic Neck: no adenopathy, supple, symmetrical, trachea midline and thyroid normal to inspection and palpation Lungs: clear to auscultation bilaterally Breasts: normal appearance, no masses or tenderness Heart: regular rate and rhythm Abdomen: soft, non-tender; no masses,  no organomegaly Extremities: extremities normal, atraumatic, no cyanosis or edema Skin: Skin color, texture, turgor normal. No rashes or lesions Lymph nodes: Cervical, supraclavicular, and  axillary nodes normal. No abnormal inguinal nodes palpated Neurologic: Grossly normal   Pelvic: External genitalia:  no lesions              Urethra:  normal appearing urethra with no masses, tenderness or lesions              Bartholin's and Skene's: normal                 Vagina: normal appearing vagina with normal color and discharge, no lesions              Cervix: anteverted  IUD strings are visible              Pap taken: No. Bimanual Exam:  Uterus:  normal size, contour, position, consistency, mobility, non-tender              Adnexa: no mass, fullness, tenderness               Rectovaginal: Confirms               Anus:  normal sphincter tone, no lesions  Chaperone present: yes  A:  Well Woman with normal exam  Skyla IUD 01/01/14             Weight gain and increase in acne since IUD  Increase in dysmenorrhea since IUD                       P:   Reviewed health and wellness pertinent to exam  Pap smear as above  She is unable to take tablets - offered her Motrin 800 mg - but she will continue with OTC children's Motrin prn  Counseled on breast self exam, adequate intake of calcium and vitamin D, diet and exercise return annually or prn  An After Visit Summary was printed and given to the patient.

## 2015-09-04 NOTE — Patient Instructions (Signed)

## 2015-09-05 LAB — HEMOGLOBIN, FINGERSTICK: HEMOGLOBIN, FINGERSTICK: 14.1 g/dL (ref 12.0–16.0)

## 2015-09-05 NOTE — Progress Notes (Signed)
Reviewed personally.  M. Suzanne Stephenia Vogan, MD.  

## 2015-09-10 ENCOUNTER — Ambulatory Visit (INDEPENDENT_AMBULATORY_CARE_PROVIDER_SITE_OTHER): Payer: BLUE CROSS/BLUE SHIELD

## 2015-09-10 ENCOUNTER — Ambulatory Visit (INDEPENDENT_AMBULATORY_CARE_PROVIDER_SITE_OTHER): Payer: BLUE CROSS/BLUE SHIELD | Admitting: Podiatry

## 2015-09-10 ENCOUNTER — Encounter: Payer: Self-pay | Admitting: Podiatry

## 2015-09-10 VITALS — BP 97/67 | HR 102 | Resp 16

## 2015-09-10 DIAGNOSIS — M722 Plantar fascial fibromatosis: Secondary | ICD-10-CM | POA: Diagnosis not present

## 2015-09-10 DIAGNOSIS — M79672 Pain in left foot: Secondary | ICD-10-CM | POA: Diagnosis not present

## 2015-09-10 MED ORDER — METHYLPREDNISOLONE 4 MG PO TBPK: ORAL_TABLET | ORAL | 0 refills | Status: DC

## 2015-09-10 MED ORDER — MELOXICAM 15 MG PO TABS: 15.0000 mg | ORAL_TABLET | Freq: Every day | ORAL | 3 refills | Status: DC

## 2015-09-10 NOTE — Patient Instructions (Signed)
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
## 2015-09-17 ENCOUNTER — Telehealth: Payer: Self-pay | Admitting: Family Medicine

## 2015-09-17 ENCOUNTER — Other Ambulatory Visit: Payer: Self-pay | Admitting: Family Medicine

## 2015-09-17 DIAGNOSIS — G43009 Migraine without aura, not intractable, without status migrainosus: Secondary | ICD-10-CM

## 2015-09-17 NOTE — Telephone Encounter (Signed)
Please call pt: - I have received a prescription refill for her amitriptyline. She is overdue for follow up on this medication by 2-4 months. She will need to make an appt for future refills. A 14 day refill can be given if she desires to continue medication and she makes a follow up appt within that time.

## 2015-09-18 MED ORDER — AMITRIPTYLINE HCL 50 MG PO TABS: 50.0000 mg | ORAL_TABLET | Freq: Every day | ORAL | 0 refills | Status: DC

## 2015-09-18 NOTE — Telephone Encounter (Signed)
spoke with patient scheduled appt sent in 14 day supply of patient medication.

## 2015-09-24 ENCOUNTER — Ambulatory Visit (INDEPENDENT_AMBULATORY_CARE_PROVIDER_SITE_OTHER): Payer: BLUE CROSS/BLUE SHIELD | Admitting: Family Medicine

## 2015-09-24 ENCOUNTER — Encounter: Payer: Self-pay | Admitting: Family Medicine

## 2015-09-24 DIAGNOSIS — G43009 Migraine without aura, not intractable, without status migrainosus: Secondary | ICD-10-CM | POA: Diagnosis not present

## 2015-09-24 MED ORDER — AMITRIPTYLINE HCL 50 MG PO TABS: 50.0000 mg | ORAL_TABLET | Freq: Every day | ORAL | 1 refills | Status: DC

## 2015-09-24 NOTE — Patient Instructions (Signed)
Follow every 6 months on chronic issues.  Yearly for physical.   Refills on amitriptyline.

## 2015-09-24 NOTE — Progress Notes (Signed)
Patient ID: Wendy BollKatherine L Poteet, female   DOB: 02/21/1985, 30 y.o.   MRN: [id number]   Subjective:    Patient ID: Wendy Molina, female    DOB: 12/25/1985, 30 y.o.   MRN: [id number]  HPI  Patient presents for scheduled office visit follow-up on migraine headaches and low vitamin D.  Migraine: Patient was been compliant with amitriptyline 50 mg QHS. She states it is helping her sleep and controlling her migraines.  She has a had only a few headaches and they have been secondary to other issues. She denies sedation or intolerable side effects. She denies depression, vision changes, photophobia, nausea or dizziness.   Prior office note 02/11/2015: Migraine: Patient presents for evaluation of increase in migraine headaches over the last "few months. "She states that she's had similar headaches years ago, but they have gone away on their own. Pain is located more on the left side of her head, but sometimes located on the right. She describes the pain as a stabbing shooting pain sometimes in the frontal area and sometimes in her occipital area. She endorses "confusion" by sometimes reading things backwards when she has a headache making it difficult to focus. She has had ringing in the ears in the past, not pulsatile with headaches. tell with He sharp pains will sometimes progress to a dull ache. Zomig has helped with her headaches in the past. Laying down in a dark room/sleeping also has improved her headaches. Patient has endorsed nausea, chest tightness and also occasionally occurs with her headaches. She rates the sharp pain lasts about 5-10 seconds, and then headache will be throbbing for hours throughout the day. She believes she is now getting as many as 2 week. She sometimes can get the sharp pain every couple of days, without headache to follow. She states when she has a migraine it can last up to about 6 hours on average. 6 hours on average.  Patient states some of her triggers may be poor sleep  or stress. Currently she is getting ready to go back to school and has been filling out all of the paperwork that needs to be completed for that and has been stressing her out. She states all this work is now complete.    Past Medical History:  Diagnosis Date  . Broken foot 08/2014   left  . Headache(784.0) 2016   migraine w/o aura  . Hyperlipidemia   . Migraines   . Stress fracture of left foot 07/2015   confirmed with MRI   No Known Allergies Social History   Social History  . Marital status: Single    Spouse name: N/A  . Number of children: 0  . Years of education: N/A   Occupational History  . Not on file.   Social History Main Topics  . Smoking status: Never Smoker  . Smokeless tobacco: Never Used  . Alcohol use 1.2 oz/week    2 Standard drinks or equivalent per week  . Drug use: No  . Sexual activity: Yes    Partners: Male    Birth control/ protection: IUD     Comment: skyla--inserted 01-01-14   Other Topics Concern  . Not on file   Social History Narrative   Single- in a relationship. No children. 3 cats.       Works for [employer]      Hobbies: reading, going to concerts, trying to get into hiking   Family History  Problem Relation Age of Onset  .  Arthritis Mother   . Colitis Mother   . Lung cancer Maternal Grandmother     smoker  . Cancer Maternal Grandmother     kidney  . Leukemia Paternal Grandmother     unsure correct type of cancer  . Heart failure Paternal Grandmother   . Alzheimer's disease Maternal Grandfather   . Kidney failure Paternal Grandfather     ? dialysis  . Heart disease Maternal Aunt     MI 55-56   Review of Systems  Psychiatric/Behavioral: Positive for depression.  This ROS is not accurate, neg for depression, can not get it to remove in the system.  Negative, with the exception of above mentioned in HPI    Objective:   Physical Exam BP 110/70 (BP Location: Right Arm, Patient Position: Sitting,  Cuff Size: Large)   Pulse 87   Temp 98.1 F (36.7 C)   Resp 20   Ht 5\' 6"  (1.676 m)   Wt 226 lb 8 oz (102.7 kg)   LMP 08/05/2015 (Exact Date)   SpO2 96%   BMI 36.56 kg/m   Body mass index is 36.56 kg/m. Gen: Afebrile. No acute distress. Nontoxic in appearance, well-developed, well-nourished, Caucasian female. HENT: AT. [place].MMM, no oral lesions.  Eyes:Pupils Equal Round Reactive to light, Extraocular movements intact,  Conjunctiva without redness, discharge or icterus. CV: RRR  Neuro/MSK: Normal gait. PERLA. EOMi. Alert. Oriented x3.  Psych: Normal affect, dress and demeanor. Normal speech. Normal thought content and judgment.    Assessment & Plan:  Migraine without aura and without status migrainosus, not intractable -  B-12, RPR, prolactin, TSH all normal. - amitriptyline 50 mg QHS, seems to be working well for her refills Amitriptyline 90d, with 1 refill provided today -  follow  6 months.   Electronically Signed by: Felix Pacini, DO [facility]- [place]

## 2015-10-08 ENCOUNTER — Ambulatory Visit (INDEPENDENT_AMBULATORY_CARE_PROVIDER_SITE_OTHER): Payer: BLUE CROSS/BLUE SHIELD | Admitting: Podiatry

## 2015-10-08 ENCOUNTER — Encounter: Payer: Self-pay | Admitting: Podiatry

## 2015-10-08 DIAGNOSIS — M722 Plantar fascial fibromatosis: Secondary | ICD-10-CM

## 2015-10-08 NOTE — Progress Notes (Signed)
She presents today for follow-up of her left heel pain. She states it is doing much better. She still has some tenderness on palpation and ambulation.  Objective: Vital signs are stable she is alert and oriented 3 there is no erythema saline as drainage or odor she has pain on palpation medial continue to go of the left heel.  Assessment: Nearly resolved plantar fasciitis left.  Plan: Reinjected left heel today with Kenalog and local anesthetic follow-up with her in 1 month. Encouraged her to continue all conservative therapies including plantar fascial brace and night splint and oral medications.

## 2015-10-18 ENCOUNTER — Encounter: Payer: BLUE CROSS/BLUE SHIELD | Admitting: Family Medicine

## 2015-10-23 ENCOUNTER — Encounter: Payer: Self-pay | Admitting: Family Medicine

## 2015-10-23 ENCOUNTER — Ambulatory Visit (INDEPENDENT_AMBULATORY_CARE_PROVIDER_SITE_OTHER): Payer: BLUE CROSS/BLUE SHIELD | Admitting: Family Medicine

## 2015-10-23 ENCOUNTER — Telehealth: Payer: Self-pay | Admitting: Family Medicine

## 2015-10-23 VITALS — BP 128/86 | HR 92 | Temp 97.8°F | Resp 20 | Ht 66.0 in | Wt 225.2 lb

## 2015-10-23 DIAGNOSIS — Z130 Encounter for screening for diseases of the blood and blood-forming organs and certain disorders involving the immune mechanism: Secondary | ICD-10-CM

## 2015-10-23 DIAGNOSIS — E669 Obesity, unspecified: Secondary | ICD-10-CM | POA: Insufficient documentation

## 2015-10-23 DIAGNOSIS — Z6836 Body mass index (BMI) 36.0-36.9, adult: Secondary | ICD-10-CM | POA: Diagnosis not present

## 2015-10-23 DIAGNOSIS — Z1329 Encounter for screening for other suspected endocrine disorder: Secondary | ICD-10-CM | POA: Diagnosis not present

## 2015-10-23 DIAGNOSIS — E559 Vitamin D deficiency, unspecified: Secondary | ICD-10-CM | POA: Diagnosis not present

## 2015-10-23 DIAGNOSIS — R6889 Other general symptoms and signs: Secondary | ICD-10-CM

## 2015-10-23 DIAGNOSIS — Z0001 Encounter for general adult medical examination with abnormal findings: Secondary | ICD-10-CM

## 2015-10-23 DIAGNOSIS — Z79899 Other long term (current) drug therapy: Secondary | ICD-10-CM | POA: Diagnosis not present

## 2015-10-23 LAB — COMPREHENSIVE METABOLIC PANEL
ALBUMIN: 4 g/dL (ref 3.5–5.2)
ALT: 13 U/L (ref 0–35)
AST: 14 U/L (ref 0–37)
Alkaline Phosphatase: 88 U/L (ref 39–117)
BILIRUBIN TOTAL: 0.6 mg/dL (ref 0.2–1.2)
BUN: 16 mg/dL (ref 6–23)
CALCIUM: 9 mg/dL (ref 8.4–10.5)
CO2: 26 mEq/L (ref 19–32)
CREATININE: 0.71 mg/dL (ref 0.40–1.20)
Chloride: 105 mEq/L (ref 96–112)
GFR: 102.07 mL/min (ref >60.00)
Glucose, Bld: 82 mg/dL (ref 70–99)
Potassium: 4.6 mEq/L (ref 3.5–5.1)
Sodium: 138 mEq/L (ref 135–145)
TOTAL PROTEIN: 6.8 g/dL (ref 6.0–8.3)

## 2015-10-23 LAB — LIPID PANEL
CHOLESTEROL: 167 mg/dL (ref 0–200)
HDL: 57.2 mg/dL (ref >39.00)
LDL Cholesterol: 88 mg/dL (ref 0–99)
NonHDL: 110.14
TRIGLYCERIDES: 109 mg/dL (ref 0.0–149.0)
Total CHOL/HDL Ratio: 3
VLDL: 21.8 mg/dL (ref 0.0–40.0)

## 2015-10-23 LAB — CBC WITH DIFFERENTIAL/PLATELET
BASOS ABS: 0.1 10*3/uL (ref 0.0–0.1)
BASOS PCT: 0.8 % (ref 0.0–3.0)
EOS ABS: 0.1 10*3/uL (ref 0.0–0.7)
Eosinophils Relative: 0.6 % (ref 0.0–5.0)
HCT: 44.3 % (ref 36.0–46.0)
HEMOGLOBIN: 15.1 g/dL — AB (ref 12.0–15.0)
Lymphocytes Relative: 31.7 % (ref 12.0–46.0)
Lymphs Abs: 2.9 10*3/uL (ref 0.7–4.0)
MCHC: 34 g/dL (ref 30.0–36.0)
MCV: 92.5 fl (ref 78.0–100.0)
MONO ABS: 0.6 10*3/uL (ref 0.1–1.0)
Monocytes Relative: 6.4 % (ref 3.0–12.0)
NEUTROS ABS: 5.5 10*3/uL (ref 1.4–7.7)
Neutrophils Relative %: 60.5 % (ref 43.0–77.0)
PLATELETS: 276 10*3/uL (ref 150.0–400.0)
RBC: 4.79 Mil/uL (ref 3.87–5.11)
RDW: 13.6 % (ref 11.5–15.5)
WBC: 9.1 10*3/uL (ref 4.0–10.5)

## 2015-10-23 LAB — VITAMIN D 25 HYDROXY (VIT D DEFICIENCY, FRACTURES): VITD: 36.05 ng/mL (ref 30.00–100.00)

## 2015-10-23 LAB — TSH: TSH: 1.95 u[IU]/mL (ref 0.35–4.50)

## 2015-10-23 NOTE — Progress Notes (Signed)
Patient ID: Wendy Molina, female  DOB: 1985-02-02, 30 y.o.   MRN: [id number] Patient Care Team    Relationship Specialty Notifications Start End  Natalia Leatherwood, DO PCP - General Family Medicine  02/11/15     Subjective:  Wendy Molina is a 30 y.o.  Female  present for CPE. All past medical history, surgical history, allergies, family history, immunizations, medications and social history were updated in the electronic medical record today. All recent labs, ED visits and hospitalizations within the last year were reviewed.  Health maintenance:  Colonoscopy: No Fhx, screen at 50 Mammogram: No Fhx screen 40-50 Cervical cancer screening: last pap:2016,  Dr. Edward Jolly, Normal PAP Negative HPV Immunizations: tdap 2014, Influenza declined flu (encouraged yearly) Infectious disease screening: HIV 04/06/2014 DEXA:N/A Assistive device: None Oxygen WUJ:WJXB Patient has a Dental home. Hospitalizations/ED visits: none  Depression screen [facility] 2/9 10/23/2015  Decreased Interest 0  Down, Depressed, Hopeless 0  PHQ - 2 Score 0   Current Exercise Habits: The patient does not participate in regular exercise at present Exercise limited by: None identified   Immunization History  Administered Date(s) Administered  . Tdap 06/22/2012     Past Medical History:  Diagnosis Date  . Broken foot 08/2014   left  . Headache(784.0) 2016   migraine w/o aura  . Hyperlipidemia   . Migraines   . Stress fracture of left foot 07/2015   confirmed with MRI   No Known Allergies Past Surgical History:  Procedure Laterality Date  . INTRAUTERINE DEVICE (IUD) INSERTION     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/01/14   Family History  Problem Relation Age of Onset  . Arthritis Mother   . Colitis Mother   . Lung cancer Maternal Grandmother     smoker  . Cancer Maternal Grandmother     kidney  . Leukemia Paternal Grandmother     unsure correct type of cancer  . Heart failure Paternal Grandmother   .  Alzheimer's disease Maternal Grandfather   . Kidney failure Paternal Grandfather     ? dialysis  . Heart disease Maternal Aunt     MI 10-56   Social History   Social History  . Marital status: Single    Spouse name: N/A  . Number of children: 0  . Years of education: N/A   Occupational History  . mortgage insurance    Social History Main Topics  . Smoking status: Never Smoker  . Smokeless tobacco: Never Used  . Alcohol use 1.2 oz/week    2 Standard drinks or equivalent per week  . Drug use: No  . Sexual activity: Yes    Partners: Male    Birth control/ protection: IUD     Comment: skyla--inserted 01-01-14   Other Topics Concern  . Not on file   Social History Narrative   Single- in a relationship. No children. 3 cats.    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   Hobbies: travel, reading, hiking   Feels safe in relationships     Medication List       Accurate as of 10/23/15  9:37 AM. Always use your most recent med list.          adapalene 0.1 % gel Commonly known as:  DIFFERIN Apply topically at bedtime.   amitriptyline 50 MG tablet Commonly known as:  ELAVIL Take 1 tablet (50 mg total)  by mouth at bedtime.   benzoyl peroxide 5 % external liquid Generic drug:  benzoyl peroxide Apply topically 2 (two) times daily.   ibuprofen 100 MG/5ML suspension Commonly known as:  ADVIL,MOTRIN Takes 2Tsp prn pain   multivitamin tablet Take 1 tablet by mouth daily.   SKYLA 13.5 MG Iud Generic drug:  Levonorgestrel by Intrauterine route.   zolmitriptan 2.5 MG disintegrating tablet Commonly known as:  ZOMIG-ZMT Take 1 tablet (2.5 mg total) by mouth as needed for migraine (may take repeat dose 2 hours later x1).        Recent Results (from the past 2160 hour(s))  Hemoglobin, fingerstick     Status: None   Collection Time: 09/04/15  3:59 PM  Result Value Ref Range   Hemoglobin,  fingerstick 14.1 12.0 - 16.0 g/dL  POCT urinalysis dipstick     Status: Normal   Collection Time: 09/04/15  4:00 PM  Result Value Ref Range   Color, UA yellow    Clarity, UA clear    Glucose, UA neg    Bilirubin, UA neg    Ketones, UA neg    Spec Grav, UA     Blood, UA neg    pH, UA 5.5    Protein, UA neg    Urobilinogen, UA negative    Nitrite, UA neg    Leukocytes, UA Negative Negative    [place] Transvaginal Non-ob  Result Date: 12/11/2014 CLINICAL DATA:  Left lower quadrant pain for 5 days. Abnormal uterine bleeding. Dysuria. IUD. LMP 12/06/2014. EXAM: TRANSABDOMINAL AND TRANSVAGINAL ULTRASOUND OF PELVIS TECHNIQUE: Both transabdominal and transvaginal ultrasound examinations of the pelvis were performed. Transabdominal technique was performed for global imaging of the pelvis including uterus, ovaries, adnexal regions, and pelvic cul-de-sac. It was necessary to proceed with endovaginal exam following the transabdominal exam to visualize the IUD and ovaries. COMPARISON:  None FINDINGS: Uterus Measurements: 7.4 x 4.5 x 5.3 cm. Retroverted. No fibroids or other mass visualized. Endometrium Thickness: 6 mm. IUD new seen in expected location within the endometrial cavity. Right ovary Measurements: 3.6 x 1.9 x 1.6 cm. Normal appearance/no adnexal mass. Left ovary Measurements: 3.9 x 1.6 x 2.1 cm. Normal appearance/no adnexal mass. Other findings Trace amount of free fluid in pelvic cul-de-sac. IMPRESSION: IUD in expected location within endometrial cavity. No evidence of uterine fibroids. Normal appearance of both ovaries.  No adnexal mass identified. Electronically Signed   By: Myles RosenthalJohn  Stahl M.D.   On: 12/11/2014 15:27   [place] Pelvis Complete  Result Date: 12/11/2014 CLINICAL DATA:  Left lower quadrant pain for 5 days. Abnormal uterine bleeding. Dysuria. IUD. LMP 12/06/2014. EXAM: TRANSABDOMINAL AND TRANSVAGINAL ULTRASOUND OF PELVIS TECHNIQUE: Both transabdominal and transvaginal ultrasound  examinations of the pelvis were performed. Transabdominal technique was performed for global imaging of the pelvis including uterus, ovaries, adnexal regions, and pelvic cul-de-sac. It was necessary to proceed with endovaginal exam following the transabdominal exam to visualize the IUD and ovaries. COMPARISON:  None FINDINGS: Uterus Measurements: 7.4 x 4.5 x 5.3 cm. Retroverted. No fibroids or other mass visualized. Endometrium Thickness: 6 mm. IUD new seen in expected location within the endometrial cavity. Right ovary Measurements: 3.6 x 1.9 x 1.6 cm. Normal appearance/no adnexal mass. Left ovary Measurements: 3.9 x 1.6 x 2.1 cm. Normal appearance/no adnexal mass. Other findings Trace amount of free fluid in pelvic cul-de-sac. IMPRESSION: IUD in expected location within endometrial cavity. No evidence of uterine fibroids. Normal appearance of both ovaries.  No adnexal mass identified. Electronically Signed  By: Myles Rosenthal M.D.   On: 12/11/2014 15:27     ROS: 14 pt review of systems performed and negative (unless mentioned in an HPI)  Objective: BP 128/86 (BP Location: Right Arm, Patient Position: Sitting, Cuff Size: Large)   Pulse 92   Temp 97.8 F (36.6 C)   Resp 20   Ht 5\' 6"  (1.676 m)   Wt 225 lb 4 oz (102.2 kg)   SpO2 98%   BMI 36.36 kg/m  Gen: Afebrile. No acute distress. Nontoxic in appearance, well-developed, well-nourished, obese, caucasian female.  HENT: AT. [place]. Bilateral TM visualized and normal in appearance, normal external auditory canal. MMM, no oral lesions, adequate dentition. Bilateral nares within normal limits. Throat without erythema, ulcerations or exudates. no Cough on exam, no hoarseness on exam. Eyes:Pupils Equal Round Reactive to light, Extraocular movements intact,  Conjunctiva without redness, discharge or icterus. Neck/lymp/endocrine: Supple,no lymphadenopathy, mild thyromegaly CV: RRR no murmur, no edema, +2/4 P posterior tibialis pulses.  Chest: CTAB, no  wheeze, rhonchi or crackles. Normal  Respiratory effort. good Air movement. Abd: Soft. obese. NTND. BS present. no Masses palpated. No hepatosplenomegaly. No rebound tenderness or guarding. Skin: no rashes, purpura or petechiae. Warm and well-perfused. Skin intact. Neuro/Msk:  Normal gait. PERLA. EOMi. Alert. Oriented x3.  Cranial nerves II through XII intact. Muscle strength 5/5 upper/lower extremity. DTRs equal bilaterally. Psych: Normal affect, dress and demeanor. Normal speech. Normal thought content and judgment.   Assessment/plan: Wendy Molina is a 30 y.o. female present for CPE. Preventive exam:  Patient was encouraged to exercise greater than 150 minutes a week. Patient was encouraged to choose a diet filled with fresh fruits and vegetables, and lean meats. AVS provided to patient today for education/recommendation on gender specific health and safety maintenance. PREP program provided.  Colonoscopy: No Fhx, screen at 50 Mammogram: No Fhx screen 40-50 Cervical cancer screening: last pap:2016,  Dr. Edward Jolly, Normal PAP Negative HPV, Skyla 2015 Immunizations: tdap 2014, Influenza declined flu (encouraged yearly) Infectious disease screening: HIV 04/06/2014 DEXA:N/A Assistive device: None Oxygen WUJ:WJXB Patient has a Dental home. Hospitalizations/ED visits: none   Routine followup on other medical issues as discussed prior  Return in about 1 year (around 10/22/2016) for CPE.  Electronically signed by: Felix Pacini, DO [facility] [place]

## 2015-10-23 NOTE — Patient Instructions (Signed)
Health Maintenance, Female Adopting a healthy lifestyle and getting preventive care can go a long way to promote health and wellness. Talk with your health care provider about what schedule of regular examinations is right for you. This is a good chance for you to check in with your provider about disease prevention and staying healthy. In between checkups, there are plenty of things you can do on your own. Experts have done a lot of research about which lifestyle changes and preventive measures are most likely to keep you healthy. Ask your health care provider for more information. WEIGHT AND DIET  Eat a healthy diet  Be sure to include plenty of vegetables, fruits, low-fat dairy products, and lean protein.  Do not eat a lot of foods high in solid fats, added sugars, or salt.  Get regular exercise. This is one of the most important things you can do for your health.  Most adults should exercise for at least 150 minutes each week. The exercise should increase your heart rate and make you sweat (moderate-intensity exercise).  Most adults should also do strengthening exercises at least twice a week. This is in addition to the moderate-intensity exercise.  Maintain a healthy weight  Body mass index (BMI) is a measurement that can be used to identify possible weight problems. It estimates body fat based on height and weight. Your health care provider can help determine your BMI and help you achieve or maintain a healthy weight.  For females 20 years of age and older:   A BMI below 18.5 is considered underweight.  A BMI of 18.5 to 24.9 is normal.  A BMI of 25 to 29.9 is considered overweight.  A BMI of 30 and above is considered obese.  Watch levels of cholesterol and blood lipids  You should start having your blood tested for lipids and cholesterol at 31 years of age, then have this test every 5 years.  You may need to have your cholesterol levels checked more often if:  Your lipid  or cholesterol levels are high.  You are older than 31 years of age.  You are at high risk for heart disease.  CANCER SCREENING   Lung Cancer  Lung cancer screening is recommended for adults 55-80 years old who are at high risk for lung cancer because of a history of smoking.  A yearly low-dose CT scan of the lungs is recommended for people who:  Currently smoke.  Have quit within the past 15 years.  Have at least a 30-pack-year history of smoking. A pack year is smoking an average of one pack of cigarettes a day for 1 year.  Yearly screening should continue until it has been 15 years since you quit.  Yearly screening should stop if you develop a health problem that would prevent you from having lung cancer treatment.  Breast Cancer  Practice breast self-awareness. This means understanding how your breasts normally appear and feel.  It also means doing regular breast self-exams. Let your health care provider know about any changes, no matter how small.  If you are in your 20s or 30s, you should have a clinical breast exam (CBE) by a health care provider every 1-3 years as part of a regular health exam.  If you are 40 or older, have a CBE every year. Also consider having a breast X-ray (mammogram) every year.  If you have a family history of breast cancer, talk to your health care provider about genetic screening.  If you   are at high risk for breast cancer, talk to your health care provider about having an MRI and a mammogram every year.  Breast cancer gene (BRCA) assessment is recommended for women who have family members with BRCA-related cancers. BRCA-related cancers include:  Breast.  Ovarian.  Tubal.  Peritoneal cancers.  Results of the assessment will determine the need for genetic counseling and BRCA1 and BRCA2 testing. Cervical Cancer Your health care provider may recommend that you be screened regularly for cancer of the pelvic organs (ovaries, uterus, and  vagina). This screening involves a pelvic examination, including checking for microscopic changes to the surface of your cervix (Pap test). You may be encouraged to have this screening done every 3 years, beginning at age 21.  For women ages 30-65, health care providers may recommend pelvic exams and Pap testing every 3 years, or they may recommend the Pap and pelvic exam, combined with testing for human papilloma virus (HPV), every 5 years. Some types of HPV increase your risk of cervical cancer. Testing for HPV may also be done on women of any age with unclear Pap test results.  Other health care providers may not recommend any screening for nonpregnant women who are considered low risk for pelvic cancer and who do not have symptoms. Ask your health care provider if a screening pelvic exam is right for you.  If you have had past treatment for cervical cancer or a condition that could lead to cancer, you need Pap tests and screening for cancer for at least 20 years after your treatment. If Pap tests have been discontinued, your risk factors (such as having a new sexual partner) need to be reassessed to determine if screening should resume. Some women have medical problems that increase the chance of getting cervical cancer. In these cases, your health care provider may recommend more frequent screening and Pap tests. Colorectal Cancer  This type of cancer can be detected and often prevented.  Routine colorectal cancer screening usually begins at 31 years of age and continues through 31 years of age.  Your health care provider may recommend screening at an earlier age if you have risk factors for colon cancer.  Your health care provider may also recommend using home test kits to check for hidden blood in the stool.  A small camera at the end of a tube can be used to examine your colon directly (sigmoidoscopy or colonoscopy). This is done to check for the earliest forms of colorectal  cancer.  Routine screening usually begins at age 50.  Direct examination of the colon should be repeated every 5-10 years through 31 years of age. However, you may need to be screened more often if early forms of precancerous polyps or small growths are found. Skin Cancer  Check your skin from head to toe regularly.  Tell your health care provider about any new moles or changes in moles, especially if there is a change in a mole's shape or color.  Also tell your health care provider if you have a mole that is larger than the size of a pencil eraser.  Always use sunscreen. Apply sunscreen liberally and repeatedly throughout the day.  Protect yourself by wearing long sleeves, pants, a wide-brimmed hat, and sunglasses whenever you are outside. HEART DISEASE, DIABETES, AND HIGH BLOOD PRESSURE   High blood pressure causes heart disease and increases the risk of stroke. High blood pressure is more likely to develop in:  People who have blood pressure in the high end   of the normal range (130-139/85-89 mm Hg).  People who are overweight or obese.  People who are African American.  If you are 38-23 years of age, have your blood pressure checked every 3-5 years. If you are 61 years of age or older, have your blood pressure checked every year. You should have your blood pressure measured twice--once when you are at a hospital or clinic, and once when you are not at a hospital or clinic. Record the average of the two measurements. To check your blood pressure when you are not at a hospital or clinic, you can use:  An automated blood pressure machine at a pharmacy.  A home blood pressure monitor.  If you are between 45 years and 39 years old, ask your health care provider if you should take aspirin to prevent strokes.  Have regular diabetes screenings. This involves taking a blood sample to check your fasting blood sugar level.  If you are at a normal weight and have a low risk for diabetes,  have this test once every three years after 31 years of age.  If you are overweight and have a high risk for diabetes, consider being tested at a younger age or more often. PREVENTING INFECTION  Hepatitis B  If you have a higher risk for hepatitis B, you should be screened for this virus. You are considered at high risk for hepatitis B if:  You were born in a country where hepatitis B is common. Ask your health care provider which countries are considered high risk.  Your parents were born in a high-risk country, and you have not been immunized against hepatitis B (hepatitis B vaccine).  You have HIV or AIDS.  You use needles to inject street drugs.  You live with someone who has hepatitis B.  You have had sex with someone who has hepatitis B.  You get hemodialysis treatment.  You take certain medicines for conditions, including cancer, organ transplantation, and autoimmune conditions. Hepatitis C  Blood testing is recommended for:  Everyone born from [birth year] through 1966.  Anyone with known risk factors for hepatitis C. Sexually transmitted infections (STIs)  You should be screened for sexually transmitted infections (STIs) including gonorrhea and chlamydia if:  You are sexually active and are younger than 31 years of age.  You are older than 31 years of age and your health care provider tells you that you are at risk for this type of infection.  Your sexual activity has changed since you were last screened and you are at an increased risk for chlamydia or gonorrhea. Ask your health care provider if you are at risk.  If you do not have HIV, but are at risk, it may be recommended that you take a prescription medicine daily to prevent HIV infection. This is called pre-exposure prophylaxis (PrEP). You are considered at risk if:  You are sexually active and do not regularly use condoms or know the HIV status of your partner(s).  You take drugs by injection.  You are sexually  active with a partner who has HIV. Talk with your health care provider about whether you are at high risk of being infected with HIV. If you choose to begin PrEP, you should first be tested for HIV. You should then be tested every 3 months for as long as you are taking PrEP.  PREGNANCY   If you are premenopausal and you may become pregnant, ask your health care provider about preconception counseling.  If you may  become pregnant, take 400 to 800 micrograms (mcg) of folic acid every day.  If you want to prevent pregnancy, talk to your health care provider about birth control (contraception). OSTEOPOROSIS AND MENOPAUSE   Osteoporosis is a disease in which the bones lose minerals and strength with aging. This can result in serious bone fractures. Your risk for osteoporosis can be identified using a bone density scan.  If you are 61 years of age or older, or if you are at risk for osteoporosis and fractures, ask your health care provider if you should be screened.  Ask your health care provider whether you should take a calcium or vitamin D supplement to lower your risk for osteoporosis.  Menopause may have certain physical symptoms and risks.  Hormone replacement therapy may reduce some of these symptoms and risks. Talk to your health care provider about whether hormone replacement therapy is right for you.  HOME CARE INSTRUCTIONS   Schedule regular health, dental, and eye exams.  Stay current with your immunizations.   Do not use any tobacco products including cigarettes, chewing tobacco, or electronic cigarettes.  If you are pregnant, do not drink alcohol.  If you are breastfeeding, limit how much and how often you drink alcohol.  Limit alcohol intake to no more than 1 drink per day for nonpregnant women. One drink equals 12 ounces of beer, 5 ounces of wine, or 1 ounces of hard liquor.  Do not use street drugs.  Do not share needles.  Ask your health care provider for help if  you need support or information about quitting drugs.  Tell your health care provider if you often feel depressed.  Tell your health care provider if you have ever been abused or do not feel safe at home.   This information is not intended to replace advice given to you by your health care provider. Make sure you discuss any questions you have with your health care provider.   Document Released: 07/28/2010 Document Revised: 02/02/2014 Document Reviewed: 12/14/2012 Elsevier Interactive Patient Education [employer].

## 2015-10-23 NOTE — Telephone Encounter (Signed)
Please call pt: - labs are all stable.

## 2015-10-24 NOTE — Telephone Encounter (Signed)
Patient aware of stable results, expressed understanding and had no questions at this time.

## 2015-11-05 ENCOUNTER — Ambulatory Visit (INDEPENDENT_AMBULATORY_CARE_PROVIDER_SITE_OTHER): Payer: BLUE CROSS/BLUE SHIELD | Admitting: Podiatry

## 2015-11-05 ENCOUNTER — Encounter: Payer: Self-pay | Admitting: Podiatry

## 2015-11-05 DIAGNOSIS — M722 Plantar fascial fibromatosis: Secondary | ICD-10-CM

## 2015-11-06 NOTE — Progress Notes (Signed)
The presents today for follow-up of her plantar fasciitis. She states it seems to be pretty well but is not 100% improvement yet. Objective: Vital signs stable alert and oriented 3. Pulses are palpable. Still has pain on palpation medial continued tubercle of the left heel. No calf pain.  Assessment: Chronic intractable plantar fasciitis approximately 80% resolved.  Plan: I injected the area was again today and had her scan for set of orthotics I will follow-up with her once his orthotics come in.

## 2015-11-29 ENCOUNTER — Encounter: Payer: Self-pay | Admitting: Family Medicine

## 2015-11-29 ENCOUNTER — Ambulatory Visit (INDEPENDENT_AMBULATORY_CARE_PROVIDER_SITE_OTHER): Payer: BLUE CROSS/BLUE SHIELD | Admitting: Family Medicine

## 2015-11-29 VITALS — BP 117/79 | HR 91 | Temp 98.3°F | Resp 20 | Wt 229.5 lb

## 2015-11-29 DIAGNOSIS — J0120 Acute ethmoidal sinusitis, unspecified: Secondary | ICD-10-CM

## 2015-11-29 MED ORDER — DOXYCYCLINE HYCLATE 100 MG PO TABS: 100.0000 mg | ORAL_TABLET | Freq: Two times a day (BID) | ORAL | 0 refills | Status: DC

## 2015-11-29 NOTE — Progress Notes (Signed)
Wendy Molina , 06/25/1985, 30 y.o., female MRN: [id number] Patient Care Team    Relationship Specialty Notifications Start End  Natalia Leatherwoodenee A Kuneff, DO PCP - General Family Medicine  02/11/15     CC: cough Subjective: Pt presents for an acute OV with complaints of dry cough, congestion, drainage of  5 days duration.  Associated symptoms include  Sore throat and hoarseness. She denies fever, chills, nausea or vomit.  Pt feels symptoms are becoming worse and now has a headache and full clogged ears. Pt has tried dayquil/nightquil and motrin to ease their symptoms.   No Known Allergies Social History  Substance Use Topics  . Smoking status: Never Smoker  . Smokeless tobacco: Never Used  . Alcohol use 1.2 oz/week    2 Standard drinks or equivalent per week   Past Medical History:  Diagnosis Date  . Broken foot 08/2014   left  . Headache(784.0) 2016   migraine w/o aura  . Hyperlipidemia   . Migraines   . Stress fracture of left foot 07/2015   confirmed with MRI   Past Surgical History:  Procedure Laterality Date  . INTRAUTERINE DEVICE (IUD) INSERTION     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/01/14   Family History  Problem Relation Age of Onset  . Arthritis Mother   . Colitis Mother   . Lung cancer Maternal Grandmother     smoker  . Cancer Maternal Grandmother     kidney  . Leukemia Paternal Grandmother     unsure correct type of cancer  . Heart failure Paternal Grandmother   . Alzheimer's disease Maternal Grandfather   . Kidney failure Paternal Grandfather     ? dialysis  . Heart disease Maternal Aunt     MI 55-56     Medication List       Accurate as of 11/29/15  9:34 AM. Always use your most recent med list.          adapalene 0.1 % gel Commonly known as:  DIFFERIN Apply topically at bedtime.   amitriptyline 50 MG tablet Commonly known as:  ELAVIL Take 1 tablet (50 mg total) by mouth at bedtime.   benzoyl peroxide 5 % external liquid Generic drug:  benzoyl  peroxide Apply topically 2 (two) times daily.   ibuprofen 100 MG/5ML suspension Commonly known as:  ADVIL,MOTRIN Takes 2Tsp prn pain   meloxicam 15 MG tablet Commonly known as:  MOBIC   multivitamin tablet Take 1 tablet by mouth daily.   SKYLA 13.5 MG Iud Generic drug:  Levonorgestrel by Intrauterine route.   zolmitriptan 2.5 MG disintegrating tablet Commonly known as:  ZOMIG-ZMT Take 1 tablet (2.5 mg total) by mouth as needed for migraine (may take repeat dose 2 hours later x1).       No results found for this or any previous visit (from the past 24 hour(s)). No results found.   ROS: Negative, with the exception of above mentioned in HPI   Objective:  BP 117/79 (BP Location: Right Arm, Patient Position: Sitting, Cuff Size: Large)   Pulse 91   Temp 98.3 F (36.8 C)   Resp 20   Wt 229 lb 8 oz (104.1 kg)   SpO2 97%   BMI 37.04 kg/m  Body mass index is 37.04 kg/m. Gen: Afebrile. No acute distress. Nontoxic in appearance, well developed, well nourished. Pleasant caucasian female.  HENT: AT. [place]. Bilateral TM visualized with air fluid levels. MMM, no oral lesions. Bilateral nares with erythema. Throat without erythema  or exudates. Mild cough, hoarseness and TTP sinus.  Eyes:Pupils Equal Round Reactive to light, Extraocular movements intact,  Conjunctiva without redness, discharge or icterus. Neck/lymp/endocrine: Supple, mild bilateral ant cervical  lymphadenopathy CV: RRR  Chest: CTAB, no wheeze or crackles. Good air movement, normal resp effort.  Abd: Soft.NTND. BS present Skin: no rashes, purpura or petechiae.  Neuro: Normal gait. PERLA. EOMi. Alert. Oriented x3   Assessment/Plan: Wendy Molina is a 30 y.o. female present for acute OV for  Acute ethmoidal sinusitis, recurrence not specified flonase daily, mucinex DM Doxycyline prescribed Rest and hydrate.  F/U PRN, or in 2 weeks   electronically signed by:  Felix Pacinienee Kuneff, DO  [facility] -  OR

## 2015-11-29 NOTE — Patient Instructions (Addendum)
Sinusitis, Adult Sinusitis is redness, soreness, and inflammation of the paranasal sinuses. Paranasal sinuses are air pockets within the bones of your face. They are located beneath your eyes, in the middle of your forehead, and above your eyes. In healthy paranasal sinuses, mucus is able to drain out, and air is able to circulate through them by way of your nose. However, when your paranasal sinuses are inflamed, mucus and air can become trapped. This can allow bacteria and other germs to grow and cause infection. Sinusitis can develop quickly and last only a short time (acute) or continue over a long period (chronic). Sinusitis that lasts for more than 12 weeks is considered chronic. CAUSES Causes of sinusitis include:  Allergies.  Structural abnormalities, such as displacement of the cartilage that separates your nostrils (deviated septum), which can decrease the air flow through your nose and sinuses and affect sinus drainage.  Functional abnormalities, such as when the small hairs (cilia) that line your sinuses and help remove mucus do not work properly or are not present. SIGNS AND SYMPTOMS Symptoms of acute and chronic sinusitis are the same. The primary symptoms are pain and pressure around the affected sinuses. Other symptoms include:  Upper toothache.  Earache.  Headache.  Bad breath.  Decreased sense of smell and taste.  A cough, which worsens when you are lying flat.  Fatigue.  Fever.  Thick drainage from your nose, which often is green and may contain pus (purulent).  Swelling and warmth over the affected sinuses. DIAGNOSIS Your health care provider will perform a physical exam. During your exam, your health care provider may perform any of the following to help determine if you have acute sinusitis or chronic sinusitis:  Look in your nose for signs of abnormal growths in your nostrils (nasal polyps).  Tap over the affected sinus to check for signs of  infection.  View the inside of your sinuses using an imaging device that has a light attached (endoscope). If your health care provider suspects that you have chronic sinusitis, one or more of the following tests may be recommended:  Allergy tests.  Nasal culture. A sample of mucus is taken from your nose, sent to a lab, and screened for bacteria.  Nasal cytology. A sample of mucus is taken from your nose and examined by your health care provider to determine if your sinusitis is related to an allergy. TREATMENT Most cases of acute sinusitis are related to a viral infection and will resolve on their own within 10 days. Sometimes, medicines are prescribed to help relieve symptoms of both acute and chronic sinusitis. These may include pain medicines, decongestants, nasal steroid sprays, or saline sprays. However, for sinusitis related to a bacterial infection, your health care provider will prescribe antibiotic medicines. These are medicines that will help kill the bacteria causing the infection. Rarely, sinusitis is caused by a fungal infection. In these cases, your health care provider will prescribe antifungal medicine. For some cases of chronic sinusitis, surgery is needed. Generally, these are cases in which sinusitis recurs more than 3 times per year, despite other treatments. HOME CARE INSTRUCTIONS  Drink plenty of water. Water helps thin the mucus so your sinuses can drain more easily.  Use a humidifier.  Inhale steam 3-4 times a day (for example, sit in the bathroom with the shower running).  Apply a warm, moist washcloth to your face 3-4 times a day, or as directed by your health care provider.  Use saline nasal sprays to help   moisten and clean your sinuses.  Take medicines only as directed by your health care provider.  If you were prescribed either an antibiotic or antifungal medicine, finish it all even if you start to feel better. SEEK IMMEDIATE MEDICAL CARE IF:  You have  increasing pain or severe headaches.  You have nausea, vomiting, or drowsiness.  You have swelling around your face.  You have vision problems.  You have a stiff neck.  You have difficulty breathing.   This information is not intended to replace advice given to you by your health care provider. Make sure you discuss any questions you have with your health care provider.   Document Released: 01/12/2005 Document Revised: 02/02/2014 Document Reviewed: 01/27/2011 Elsevier Interactive Patient Education 2016 [employer]   flonase daily, mucinex DM Doxycyline every 12 hours for 10 days Rest and hydrate.

## 2015-12-03 ENCOUNTER — Encounter: Payer: Self-pay | Admitting: Podiatry

## 2015-12-03 ENCOUNTER — Ambulatory Visit (INDEPENDENT_AMBULATORY_CARE_PROVIDER_SITE_OTHER): Payer: BLUE CROSS/BLUE SHIELD | Admitting: Podiatry

## 2015-12-03 DIAGNOSIS — M722 Plantar fascial fibromatosis: Secondary | ICD-10-CM

## 2015-12-03 NOTE — Patient Instructions (Signed)

## 2015-12-04 NOTE — Progress Notes (Signed)
She presents today to pick up her orthotics and states that she is doing much better with her plantar fasciitis.  Objective: No change in physical exam.  Assessment: Plantar fascitis left foot.  Plan: Dispensed orthotics provide her with oral and written home going instructions for care use of the orthotics today follow up with her in 6 weeks.

## 2015-12-31 ENCOUNTER — Ambulatory Visit (INDEPENDENT_AMBULATORY_CARE_PROVIDER_SITE_OTHER): Payer: BLUE CROSS/BLUE SHIELD | Admitting: Podiatry

## 2015-12-31 ENCOUNTER — Encounter: Payer: Self-pay | Admitting: Podiatry

## 2015-12-31 DIAGNOSIS — M722 Plantar fascial fibromatosis: Secondary | ICD-10-CM | POA: Diagnosis not present

## 2016-01-01 NOTE — Progress Notes (Signed)
She presents today for followup of her plantar fasciitis to her left foot. She states that she has not been wearing her orthotics as discussed.   Objective: Pulses are alpable she retains pain on palpation medial continued tubercle the left foot but much decrease from previous evaluation.  Assessment: Slowly resolving plantar fasciitis.  Plan: Discussed the use of appropriate shoe gear with the orthotics. She will continue all other conservative therapies an old followup with her in one month. She's not improved we'll discuss surgical intervention.

## 2016-01-30 ENCOUNTER — Ambulatory Visit: Payer: BLUE CROSS/BLUE SHIELD | Admitting: Podiatry

## 2016-02-13 ENCOUNTER — Ambulatory Visit: Payer: BLUE CROSS/BLUE SHIELD | Admitting: Podiatry

## 2016-03-12 ENCOUNTER — Ambulatory Visit (INDEPENDENT_AMBULATORY_CARE_PROVIDER_SITE_OTHER): Payer: BLUE CROSS/BLUE SHIELD | Admitting: Podiatry

## 2016-03-12 DIAGNOSIS — M722 Plantar fascial fibromatosis: Secondary | ICD-10-CM | POA: Diagnosis not present

## 2016-03-12 MED ORDER — MELOXICAM 15 MG PO TABS: 15.0000 mg | ORAL_TABLET | Freq: Every day | ORAL | 3 refills | Status: DC

## 2016-03-12 NOTE — Progress Notes (Signed)
She presents today for follow-up of plantar fasciitis and left heel. States it is still tender.  Objective: Vital signs are stable alert and oriented 3. On palpation to her left heel.  Assessment: Pain in limb secondary to plantar fascitis left.  Plan: Injected left heel again today with Kenalog and local anesthetic follow-up with me in 6 weeks.

## 2016-03-25 ENCOUNTER — Ambulatory Visit: Payer: BLUE CROSS/BLUE SHIELD | Admitting: Family Medicine

## 2016-03-31 ENCOUNTER — Encounter: Payer: Self-pay | Admitting: Family Medicine

## 2016-03-31 ENCOUNTER — Ambulatory Visit (INDEPENDENT_AMBULATORY_CARE_PROVIDER_SITE_OTHER): Payer: BLUE CROSS/BLUE SHIELD | Admitting: Family Medicine

## 2016-03-31 DIAGNOSIS — G43009 Migraine without aura, not intractable, without status migrainosus: Secondary | ICD-10-CM | POA: Diagnosis not present

## 2016-03-31 MED ORDER — AMITRIPTYLINE HCL 50 MG PO TABS: 50.0000 mg | ORAL_TABLET | Freq: Every day | ORAL | 1 refills | Status: DC

## 2016-03-31 NOTE — Progress Notes (Signed)
Patient ID: Wendy Molina, female   DOB: 28-Nov-1985, 31 y.o.   MRN: [id number]   Subjective:    Patient ID: Wendy Molina, female    DOB: Oct 16, 1985, 31 y.o.   MRN: [id number]  Depression          Migraine Pt presents for migraine followup today. She is well controlled with Amitriptyline 50 mg QHS. She has not needed any break through medications over the last 6 months. She currently has refills on Zomig, if she would need break through medicine. She has just moved into her new apartment and doing well. She is able to get adequate rest with the use of medications as well.   Original note 02/11/2015:  Patient presents for evaluation of increase in migraine headaches over the last "few months. "She states that she's had similar headaches years ago, but they have gone away on their own. Pain is located more on the left side of her head, but sometimes located on the right. She describes the pain as a stabbing shooting pain sometimes in the frontal area and sometimes in her occipital area. She endorses "confusion" by sometimes reading things backwards when she has a headache making it difficult to focus. She has had ringing in the ears in the past, not pulsatile with headaches. tell with He sharp pains will sometimes progress to a dull ache. Zomig has helped with her headaches in the past. Laying down in a dark room/sleeping also has improved her headaches. Patient has endorsed nausea, chest tightness and also occasionally occurs with her headaches. She rates the sharp pain lasts about 5-10 seconds, and then headache will be throbbing for hours throughout the day. She believes she is now getting as many as 2 week. She sometimes can get the sharp pain every couple of days, without headache to follow. She states when she has a migraine it can last up to about 6 hours on average. 6 hours on average.  Patient states some of her triggers may be poor sleep or stress. Currently she is getting ready to go  back to school and has been filling out all of the paperwork that needs to be completed for that and has been stressing her out. She states all this work is now complete.    Past Medical History:  Diagnosis Date  . Broken foot 08/2014   left  . Headache(784.0) 2016   migraine w/o aura  . Hyperlipidemia   . Migraines   . Stress fracture of left foot 07/2015   confirmed with MRI   No Known Allergies Social History   Social History  . Marital status: Single    Spouse name: N/A  . Number of children: 0  . Years of education: N/A   Occupational History  . mortgage insurance    Social History Main Topics  . Smoking status: Never Smoker  . Smokeless tobacco: Never Used  . Alcohol use 1.2 oz/week    2 Standard drinks or equivalent per week  . Drug use: No  . Sexual activity: Yes    Partners: Male    Birth control/ protection: IUD     Comment: skyla--inserted 01-01-14   Other Topics Concern  . Not on file   Social History Narrative   Single- in a relationship. No children. 3 cats.    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   Hobbies: travel, reading, hiking  Feels safe in relationships   Family History  Problem Relation Age of Onset  . Arthritis Mother   . Colitis Mother   . Lung cancer Maternal Grandmother     smoker  . Cancer Maternal Grandmother     kidney  . Leukemia Paternal Grandmother     unsure correct type of cancer  . Heart failure Paternal Grandmother   . Alzheimer's disease Maternal Grandfather   . Kidney failure Paternal Grandfather     ? dialysis  . Heart disease Maternal Aunt     MI 55-56   Review of Systems  Psychiatric/Behavioral: Positive for depression.  This ROS is not accurate, neg for depression, can not get it to remove in the system.  Negative, with the exception of above mentioned in HPI    Objective:   Physical Exam BP 126/85 (BP Location: Right Arm,  Patient Position: Sitting, Cuff Size: Large)   Pulse 94   Temp 98.1 F (36.7 C)   Resp 20   Ht  (1.676 m)   Wt 235 lb 12.8 oz (107 kg)   SpO2 98%   BMI 38.06 kg/m   Body mass index is 38.06 kg/m. Gen: Afebrile. No acute distress. Very pleasant caucasian female.  HENT: AT. [place].  MMM.  Eyes:Pupils Equal Round Reactive to light, Extraocular movements intact,  Conjunctiva without redness, discharge or icterus. CV: RRR  Chest: CTAB, no wheeze or crackles Neuro: Normal gait. PERLA. EOMi. Alert. Oriented.  Psych: Normal affect, dress and demeanor. Normal speech. Normal thought content and judgment..      Assessment & Plan:  Migraine without aura and without status migrainosus, not intractable - Stable today. - refill amitriptyline 50 mg QHS, 90 day x 1 refill - Zomig refill not needed at this time. Will be happy to call if needed by phone call.  -  follow  6 months.   Electronically Signed by: Felix Pacini, DO [facility]- [place]

## 2016-03-31 NOTE — Patient Instructions (Signed)
It was great to see you today.  Every 6 months on med check.    Every year on physical.    Please help us help you:  We are honored [facility]. Below you will find basic instructions that you may need to access in the future. Please help us help you by reading the instructions, which cover many of the frequent questions we experience.   Prescription refills and request:  -In order to allow more efficient response time, please call your pharmacy for all refills. They will forward the request electronically to us. This allows for the quickest possible response. Request left on a nurse line can take longer to refill, since these are checked as time allows between office patients and other phone calls.  - refill request can take up to 3-5 working days to complete.  - If request is sent electronically and request is appropiate, it is usually completed in 1-2 business days.  - all patients will need to be seen routinely for all chronic medical conditions requiring prescription medications (see follow-up below). If you are overdue for follow up on your condition, you will be asked to make an appointment and we will call in enough medication to cover you until your appointment (up to 30 days).  - all controlled substances will require a face to face visit to request/refill.  - if you desire your prescriptions to go through a new pharmacy, and have an active script at original pharmacy, you will need to call your pharmacy and have scripts transferred to new pharmacy. This is completed between the pharmacy locations and not by your provider.    Results: If any images or labs were ordered, it can take up to 1 week to get results depending on the test ordered and the lab/facility running and resulting the test. - Normal or stable results, which do not need further discussion, will be released to your mychart immediately with attached note to you. A call will  not be generated for normal results. Please make certain to sign up for mychart. If you have questions on how to activate your mychart you can call the front office.  - If your results need further discussion, our office will attempt to contact you via phone, and if unable to reach you after 2 attempts, we will release your abnormal result to your mychart with instructions.  - All results will be automatically released in mychart after 1 week.  - Your provider will provide you with explanation and instruction on all relevant material in your results. Please keep in mind, results and labs may appear confusing or abnormal to the untrained eye, but it does not mean they are actually abnormal for you personally. If you have any questions about your results that are not covered, or you desire more detailed explanation than what was provided, you should make an appointment with your provider to do so.   Our office handles many outgoing and incoming calls daily. If we have not contacted you within 1 week about your results, please check your mychart to see if there is a message first and if not, then contact our office.  In helping with this matter, you help decrease call volume, and therefore allow us to be able to respond to patients needs more efficiently.   Acute office visits (sick visit):  An acute visit is intended for a new problem and are scheduled in shorter time slots to  allow schedule openings for patients with new problems. This is the appropriate visit to discuss a new problem. In order to provide you with excellent quality medical care with proper time for you to explain your problem, have an exam and receive treatment with instructions, these appointments should be limited to one new problem per visit. If you experience a new problem, in which you desire to be addressed, please make an acute office visit, we save openings on the schedule to accommodate you. Please do not save your new problem for  any other type of visit, let us take care of it properly and quickly for you.   Follow up visits:  Depending on your condition(s) your provider will need to see you routinely in order to provide you with quality care and prescribe medication(s). Most chronic conditions (Example: hypertension, Diabetes, depression/anxiety... etc), require visits a couple times a year. Your provider will instruct you on proper follow up for your personal medical conditions and history. Please make certain to make follow up appointments for your condition as instructed. Failing to do so could result in lapse in your medication treatment/refills. If you request a refill, and are overdue to be seen on a condition, we will always provide you with a 30 day script (once) to allow you time to schedule.    Medicare wellness (well visit): - we have a wonderful Nurse Selena Batten), that will meet with you and provide you will yearly medicare wellness visits. These visits should occur yearly (can not be scheduled less than 1 calendar year apart) and cover preventive health, immunizations, advance directives and screenings you are entitled to yearly through your medicare benefits. Do not miss out on your entitled benefits, this is when medicare will pay for these benefits to be ordered for you.  These are strongly encouraged by your provider and is the appropriate type of visit to make certain you are up to date with all preventive health benefits. If you have not had your medicare wellness exam in the last 12 months, please make certain to schedule one by calling the office and schedule your medicare wellness with Selena Batten as soon as possible.   Yearly physical (well visit):  - Adults are recommended to be seen yearly for physicals. Check with your insurance and date of your last physical, most insurances require one calendar year between physicals. Physicals include all preventive health topics, screenings, medical exam and labs that are  appropriate for gender/age and history. You may have fasting labs needed at this visit. This is a well visit (not a sick visit), acute topics should not be covered during this visit.  - Pediatric patients are seen more frequently when they are younger. Your provider will advise you on well child visit timing that is appropriate for your their age. - This is not a medicare wellness visit. Medicare wellness exams do not have an exam portion to the visit. Some medicare companies allow for a physical, some do not allow a yearly physical. If your medicare allows a yearly physical you can schedule the medicare wellness with our nurse Selena Batten and have your physical with your provider after, on the same day. Please check with insurance for your full benefits.   Late Policy/No Shows:  - all new patients should arrive 15-30 minutes earlier than appointment to allow [place] time  to  obtain all personal demographics,  insurance information and for you to complete office paperwork. - All established patients should arrive 10-15 minutes earlier than appointment  time to update all information and be checked in .  - In our best efforts to run on time, if you are late for your appointment you will be asked to either reschedule or if able, we will work you back into the schedule. There will be a wait time to work you back in the schedule,  depending on availability.  - If you are unable to make it to your appointment as scheduled, please call 24 hours ahead of time to allow [place] to fill the time slot with someone else who needs to be seen. If you do not cancel your appointment ahead of time, you may be charged a no show fee.

## 2016-04-23 ENCOUNTER — Ambulatory Visit (INDEPENDENT_AMBULATORY_CARE_PROVIDER_SITE_OTHER): Payer: BLUE CROSS/BLUE SHIELD | Admitting: Podiatry

## 2016-04-23 ENCOUNTER — Encounter: Payer: Self-pay | Admitting: Podiatry

## 2016-04-23 DIAGNOSIS — M722 Plantar fascial fibromatosis: Secondary | ICD-10-CM | POA: Diagnosis not present

## 2016-04-23 NOTE — Progress Notes (Signed)
She presents today for follow-up of her plantar fasciitis. She states that she is almost 100% improved.  Objective: Vital signs are stable she is alert and oriented 3. No pain on palpation medial cooking or tubercle left pulses remain palpable no calf pain.  Assessment: Plantar fasciitis left.  Plan: Follow up with her on an as-needed basis.

## 2016-06-10 ENCOUNTER — Ambulatory Visit (INDEPENDENT_AMBULATORY_CARE_PROVIDER_SITE_OTHER): Payer: BLUE CROSS/BLUE SHIELD | Admitting: Family Medicine

## 2016-06-10 ENCOUNTER — Encounter: Payer: Self-pay | Admitting: Family Medicine

## 2016-06-10 VITALS — BP 117/88 | HR 89 | Temp 97.4°F | Resp 20 | Wt 238.0 lb

## 2016-06-10 DIAGNOSIS — J301 Allergic rhinitis due to pollen: Secondary | ICD-10-CM | POA: Diagnosis not present

## 2016-06-10 NOTE — Patient Instructions (Signed)
Start antihistamine- Claritin or allegra.  Start flonase daily. Use these for 2-3 weeks at least.  Rest and hydrate.   Mucinex DM with decrease secretions and stop cough, which is what is causing sore throat.   Allergic Rhinitis Allergic rhinitis is when the mucous membranes in the nose respond to allergens. Allergens are particles in the air that cause your body to have an allergic reaction. This causes you to release allergic antibodies. Through a chain of events, these eventually cause you to release histamine into the blood stream. Although meant to protect the body, it is this release of histamine that causes your discomfort, such as frequent sneezing, congestion, and an itchy, runny nose. What are the causes? Seasonal allergic rhinitis (hay fever) is caused by pollen allergens that may come from grasses, trees, and weeds. Year-round allergic rhinitis (perennial allergic rhinitis) is caused by allergens such as house dust mites, pet dander, and mold spores. What are the signs or symptoms?  Nasal stuffiness (congestion).  Itchy, runny nose with sneezing and tearing of the eyes. How is this diagnosed? Your health care provider can help you determine the allergen or allergens that trigger your symptoms. If you and your health care provider are unable to determine the allergen, skin or blood testing may be used. Your health care provider will diagnose your condition after taking your health history and performing a physical exam. Your health care provider may assess you for other related conditions, such as asthma, pink eye, or an ear infection. How is this treated? Allergic rhinitis does not have a cure, but it can be controlled by:  Medicines that block allergy symptoms. These may include allergy shots, nasal sprays, and oral antihistamines.  Avoiding the allergen. Hay fever may often be treated with antihistamines in pill or nasal spray forms. Antihistamines block the effects of  histamine. There are over-the-counter medicines that may help with nasal congestion and swelling around the eyes. Check with your health care provider before taking or giving this medicine. If avoiding the allergen or the medicine prescribed do not work, there are many new medicines your health care provider can prescribe. Stronger medicine may be used if initial measures are ineffective. Desensitizing injections can be used if medicine and avoidance does not work. Desensitization is when a patient is given ongoing shots until the body becomes less sensitive to the allergen. Make sure you follow up with your health care provider if problems continue. Follow these instructions at home: It is not possible to completely avoid allergens, but you can reduce your symptoms by taking steps to limit your exposure to them. It helps to know exactly what you are allergic to so that you can avoid your specific triggers. Contact a health care provider if:  You have a fever.  You develop a cough that does not stop easily (persistent).  You have shortness of breath.  You start wheezing.  Symptoms interfere with normal daily activities. This information is not intended to replace advice given to you by your health care provider. Make sure you discuss any questions you have with your health care provider. Document Released: 10/07/2000 Document Revised: 09/13/2015 Document Reviewed: 09/19/2012 Elsevier Interactive Patient Education  2017 [employer]

## 2016-06-10 NOTE — Progress Notes (Signed)
Wendy Molina , 07/27/1985, 31 y.o., female MRN: [id number] Patient Care Team    Relationship Specialty Notifications Start End  Natalia Leatherwood, DO PCP - General Family Medicine  02/11/15     Chief Complaint  Patient presents with  . Sore Throat    congestion x 3 days  . Cough     Subjective: Pt presents for an OV with complaints of nasal congestion and sore throat  of 3 days duration.  Associated symptoms include rhinorrhea, sneezing, int headache, dry cough. She denies fever, chills, nausea, vomit, diarrrhea or rash. She reports she does not usually have allergies. She was in [place] thia past weekend and the pollen was thick in the area she was in.  Pt has tried robitussin to ease their symptoms.   Depression screen PHQ 2/9 10/23/2015  Decreased Interest 0  Down, Depressed, Hopeless 0  PHQ - 2 Score 0    No Known Allergies Social History  Substance Use Topics  . Smoking status: Never Smoker  . Smokeless tobacco: Never Used  . Alcohol use 1.2 oz/week    2 Standard drinks or equivalent per week   Past Medical History:  Diagnosis Date  . Broken foot 08/2014   left  . Headache(784.0) 2016   migraine w/o aura  . Hyperlipidemia   . Migraines   . Stress fracture of left foot 07/2015   confirmed with MRI   Past Surgical History:  Procedure Laterality Date  . INTRAUTERINE DEVICE (IUD) INSERTION     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/01/14   Family History  Problem Relation Age of Onset  . Arthritis Mother   . Colitis Mother   . Lung cancer Maternal Grandmother        smoker  . Cancer Maternal Grandmother        kidney  . Leukemia Paternal Grandmother        unsure correct type of cancer  . Heart failure Paternal Grandmother   . Alzheimer's disease Maternal Grandfather   . Kidney failure Paternal Grandfather        ? dialysis  . Heart disease Maternal Aunt        MI 55-56   Allergies as of 06/10/2016   No Known Allergies     Medication List       Accurate as of  06/10/16  9:57 AM. Always use your most recent med list.          amitriptyline 50 MG tablet Commonly known as:  ELAVIL Take 1 tablet (50 mg total) by mouth at bedtime.   meloxicam 15 MG tablet Commonly known as:  MOBIC Take 1 tablet (15 mg total) by mouth daily.   multivitamin tablet Take 1 tablet by mouth daily.   SKYLA 13.5 MG Iud Generic drug:  Levonorgestrel by Intrauterine route.   zolmitriptan 2.5 MG disintegrating tablet Commonly known as:  ZOMIG-ZMT Take 1 tablet (2.5 mg total) by mouth as needed for migraine (may take repeat dose 2 hours later x1).       All past medical history, surgical history, allergies, family history, immunizations andmedications were updated in the EMR today and reviewed under the history and medication portions of their EMR.     ROS: Negative, with the exception of above mentioned in HPI   Objective:  BP 117/88 (BP Location: Left Arm, Patient Position: Sitting, Cuff Size: Large)   Pulse 89   Temp 97.4 F (36.3 C)   Resp 20   Wt 238 lb (108  kg)   SpO2 96%   BMI 38.41 kg/m  Body mass index is 38.41 kg/m. Gen: Afebrile. No acute distress. Nontoxic in appearance, well developed, well nourished.  HENT: AT. [place]. Bilateral TM visualized with fullness, no erythema or bulging. MMM, no oral lesions. Bilateral nares with swelling. Throat without erythema or exudates. PND present. No cough or hoarseness. No TTP sinus.  Eyes:Pupils Equal Round Reactive to light, Extraocular movements intact,  Conjunctiva without redness, discharge or icterus. Neck/lymp/endocrine: Supple,no lymphadenopathy CV: RRR  Chest: CTAB, no wheeze or crackles. Good air movement, normal resp effort.  Neuro:  Normal gait. PERLA. EOMi. Alert. Oriented x3  No exam data present No results found. No results found for this or any previous visit (from the past 24 hour(s)).  Assessment/Plan: Wendy Molina is a 31 y.o. female present for OV for  1. Allergic rhinitis due to  pollen, unspecified seasonality - possibly early viral infection, but more likely allergies.  - rest, hydrate, nsaids. start antihistamine and flonase (at least 2 weeks). Consider nasal saline.  - Use mucinex DM to decrease secretions/cough.  - F/u PRN, return if symptoms worsen.   Reviewed expectations re: course of current medical issues.  Discussed self-management of symptoms.  Outlined signs and symptoms indicating need for more acute intervention.  Patient verbalized understanding and all questions were answered.  Patient received an After-Visit Summary.   Note is dictated utilizing voice recognition software. Although note has been proof read prior to signing, occasional typographical errors still can be missed. If any questions arise, please do not hesitate to call for verification.   electronically signed by:  Felix Pacinienee Rayleen Wyrick, DO  [facility] [place]

## 2016-09-04 ENCOUNTER — Ambulatory Visit (INDEPENDENT_AMBULATORY_CARE_PROVIDER_SITE_OTHER): Payer: BLUE CROSS/BLUE SHIELD | Admitting: Obstetrics and Gynecology

## 2016-09-04 ENCOUNTER — Ambulatory Visit: Payer: BLUE CROSS/BLUE SHIELD | Admitting: Nurse Practitioner

## 2016-09-04 ENCOUNTER — Encounter: Payer: Self-pay | Admitting: Obstetrics and Gynecology

## 2016-09-04 ENCOUNTER — Telehealth: Payer: Self-pay | Admitting: Obstetrics and Gynecology

## 2016-09-04 VITALS — BP 114/70 | HR 60 | Resp 14 | Ht 65.5 in | Wt 233.4 lb

## 2016-09-04 DIAGNOSIS — Z3009 Encounter for other general counseling and advice on contraception: Secondary | ICD-10-CM

## 2016-09-04 DIAGNOSIS — Z01419 Encounter for gynecological examination (general) (routine) without abnormal findings: Secondary | ICD-10-CM | POA: Diagnosis not present

## 2016-09-04 DIAGNOSIS — Z113 Encounter for screening for infections with a predominantly sexual mode of transmission: Secondary | ICD-10-CM

## 2016-09-04 NOTE — Patient Instructions (Signed)

## 2016-09-04 NOTE — Progress Notes (Signed)
31 y.o. G0P0000 Single Caucasian female here for annual exam.    Doing well with Skyla IUD.  Cycles are more painful but occur every 3 months.   Studying forensic accounting.   Gained weight.   New boyfriend almost 3 years ago.  They live together.   Wants to do yearly STD testing.   Broke her foot.   PCP:  Carmelia Bakeenee Kunuff, D.O.  No LMP recorded. Patient is not currently having periods (Reason: IUD).     Period Cycle (Days):  (every 3 months--Skyla IUD)     Sexually active: Yes.    female The current method of family planning is IUD--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-01-14.    Exercising: No.   Smoker:  no  Health Maintenance: Pap:  08/14/14, Negative with neg HR HPV History of abnormal Pap:  no MMG:  n/a Colonoscopy:  n/a BMD:   n/a TDaP:  06/22/12 Gardasil:   1 injection only 07/2010 HIV and Hep C: 04/06/14 Negative Screening Labs:   STD screening.    reports that she has never smoked. She has never used smokeless tobacco. She reports that she drinks about 1.2 oz of alcohol per week . She reports that she does not use drugs.  Past Medical History:  Diagnosis Date  . Broken foot 08/2014   left  . Headache(784.0) 2016   migraine w/o aura  . Hyperlipidemia   . Migraines   . Plantar fasciitis of left foot   . Stress fracture of left foot 07/2015   confirmed with MRI    Past Surgical History:  Procedure Laterality Date  . INTRAUTERINE DEVICE (IUD) INSERTION     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/01/14    Current Outpatient Prescriptions  Medication Sig Dispense Refill  . amitriptyline (ELAVIL) 50 MG tablet Take 1 tablet (50 mg total) by mouth at bedtime. 90 tablet 1  . Levonorgestrel (SKYLA) 13.5 MG IUD by Intrauterine route.    . meloxicam (MOBIC) 15 MG tablet Take 1 tablet (15 mg total) by mouth daily. 30 tablet 3  . Multiple Vitamin (MULTIVITAMIN) tablet Take 1 tablet by mouth daily.    Marland Kitchen. zolmitriptan (ZOMIG-ZMT) 2.5 MG disintegrating tablet Take 1 tablet (2.5 mg total) by mouth as needed for  migraine (may take repeat dose 2 hours later x1). 10 tablet 5   No current facility-administered medications for this visit.     Family History  Problem Relation Age of Onset  . Arthritis Mother   . Colitis Mother   . Lung cancer Maternal Grandmother        smoker  . Cancer Maternal Grandmother        kidney  . Leukemia Paternal Grandmother        unsure correct type of cancer  . Heart failure Paternal Grandmother   . Alzheimer's disease Maternal Grandfather   . Kidney failure Paternal Grandfather        ? dialysis  . Heart disease Maternal Aunt        MI 55-56    ROS:  Pertinent items are noted in HPI.  Otherwise, a comprehensive ROS was negative.  Exam:   BP 114/70 (BP Location: Right Arm, Patient Position: Sitting, Cuff Size: Large)   Pulse 60   Resp 14   Ht 5' 5.5" (1.664 m)   Wt 233 lb 6.4 oz (105.9 kg)   BMI 38.25 kg/m     General appearance: alert, cooperative and appears stated age Head: Normocephalic, without obvious abnormality, atraumatic Neck: no adenopathy, supple, symmetrical, trachea midline  and thyroid normal to inspection and palpation Lungs: clear to auscultation bilaterally Breasts: normal appearance, no masses or tenderness, No nipple retraction or dimpling, No nipple discharge or bleeding, No axillary or supraclavicular adenopathy Heart: regular rate and rhythm Abdomen: soft, non-tender; no masses, no organomegaly Extremities: extremities normal, atraumatic, no cyanosis or edema Skin: Skin color, texture, turgor normal. No rashes or lesions Lymph nodes: Cervical, supraclavicular, and axillary nodes normal. No abnormal inguinal nodes palpated Neurologic: Grossly normal  Pelvic: External genitalia:  no lesions              Urethra:  normal appearing urethra with no masses, tenderness or lesions              Bartholins and Skenes: normal                 Vagina: normal appearing vagina with normal color and discharge, no lesions              Cervix:  no lesions.  IUD strings noted. Small amount of blood.              Pap taken: No. Bimanual Exam:  Uterus:  normal size, contour, position, consistency, mobility, non-tender              Adnexa: no mass, fullness, tenderness        Chaperone was present for exam.  Assessment:   Well woman visit with normal exam. Skyla IUD.  Weight gain.  STD screening.   Plan: Mammogram screening discussed. Recommended self breast awareness. Pap and HR HPV as above. Guidelines for Calcium, Vitamin D, regular exercise program including cardiovascular and weight bearing exercise. We discussed switching from [place] to [place] IUD before the end of the year.  Will plan to do Cytotec 200 mcg pv at hs night before and the am of the procedure and use paracervical block.  Discussed plan for weight loss. STD screening today.  No other routine labs. Follow up annually and prn.   After visit summary provided.

## 2016-09-04 NOTE — Telephone Encounter (Signed)
Call to patient to review information for IUD removal and Kyleena insertion. Patient understood and agreeable. Patient states she would like to think about it over the weekend and would prefer to schedule in October or November 2018.   Patient will call to schedule. No further questions.  Routing to provider for review. Will close encounter.

## 2016-09-05 LAB — HEP, RPR, HIV PANEL
HIV SCREEN 4TH GENERATION: NONREACTIVE
Hepatitis B Surface Ag: NEGATIVE
RPR Ser Ql: NONREACTIVE

## 2016-09-05 LAB — HEPATITIS C ANTIBODY: Hep C Virus Ab: <0.1 s/co ratio (ref 0.0–0.9)

## 2016-09-06 LAB — CHLAMYDIA/GONOCOCCUS/TRICHOMONAS, NAA
Chlamydia by NAA: NEGATIVE
GONOCOCCUS BY NAA: NEGATIVE
Trich vag by NAA: NEGATIVE

## 2016-09-08 ENCOUNTER — Telehealth: Payer: Self-pay | Admitting: Obstetrics and Gynecology

## 2016-09-08 MED ORDER — MISOPROSTOL 200 MCG PO TABS: ORAL_TABLET | ORAL | 0 refills | Status: DC

## 2016-09-08 NOTE — Telephone Encounter (Signed)
Left message to call Wendy Molina at [phone].  Wendy Molina inserted on 01/01/14 Wants removal and Wendy Molina insert. Due by 01/01/17.

## 2016-09-08 NOTE — Telephone Encounter (Signed)
Spoke with patient. Patient would like to schedule Skyla removal and Kyleena insertion. Appointment scheduled for 12/04/2016 at 10 am with Dr.Silva. Patient is agreeable to date and time. Pre procedure instructions given.  Motrin instructions given. Motrin=Advil=Ibuprofen, 800 mg one hour before appointment. Eat a meal and hydrate well before appointment. Cytotec instructions given. Rx for Cytotec 200 mcg Place 2 tablets vaginally 6-12 hours prior to procedure sent to pharmacy on file.  Routing to provider for final review. Patient agreeable to disposition. Will close encounter.

## 2016-09-08 NOTE — Telephone Encounter (Signed)
Patient would like to schedule her IUD exchange appointment. Patient would like this appointment in a few months.

## 2016-10-01 ENCOUNTER — Ambulatory Visit: Payer: BLUE CROSS/BLUE SHIELD | Admitting: Family Medicine

## 2016-10-06 ENCOUNTER — Ambulatory Visit: Payer: BLUE CROSS/BLUE SHIELD | Admitting: Family Medicine

## 2016-10-09 ENCOUNTER — Ambulatory Visit (INDEPENDENT_AMBULATORY_CARE_PROVIDER_SITE_OTHER): Payer: BLUE CROSS/BLUE SHIELD | Admitting: Family Medicine

## 2016-10-09 ENCOUNTER — Encounter: Payer: Self-pay | Admitting: Family Medicine

## 2016-10-09 VITALS — BP 124/87 | HR 100 | Temp 98.2°F | Resp 20 | Ht 66.0 in | Wt 234.0 lb

## 2016-10-09 DIAGNOSIS — G43009 Migraine without aura, not intractable, without status migrainosus: Secondary | ICD-10-CM

## 2016-10-09 DIAGNOSIS — L02415 Cutaneous abscess of right lower limb: Secondary | ICD-10-CM | POA: Diagnosis not present

## 2016-10-09 MED ORDER — AMITRIPTYLINE HCL 50 MG PO TABS: 50.0000 mg | ORAL_TABLET | Freq: Every day | ORAL | 1 refills | Status: DC

## 2016-10-09 MED ORDER — ZOLMITRIPTAN 2.5 MG PO TBDP: 2.5000 mg | ORAL_TABLET | ORAL | 5 refills | Status: DC | PRN

## 2016-10-09 MED ORDER — CEPHALEXIN 500 MG PO CAPS: 500.0000 mg | ORAL_CAPSULE | Freq: Two times a day (BID) | ORAL | 0 refills | Status: DC

## 2016-10-09 NOTE — Progress Notes (Signed)
Wendy Molina , January 22, 1986, 31 y.o., female MRN: [id number] Patient Care Team    Relationship Specialty Notifications Start End  Natalia Leatherwood, DO PCP - General Family Medicine  02/11/15     Chief Complaint  Patient presents with  . Migraine    follow up/med refills     Subjective:   Skin abscess: Patient reports she noticed a new skin abscess on her right inner thigh a few days ago. It has become larger and a little bit more tender. She states it is not draining, it is not red. She denies fever, chills, nausea or vomit. She denies prior MRSA infection.  Migraine Patient reports compliance with her amitriptyline 50 mg before bed. She states she has not used any of her breakthrough medications. Her Zomig is likely expired. She is well controlled with Amitriptyline 50 mg QHS. She is moving again, out of her apartment into a town home town.  Original note 02/11/2015:  Patient presents for evaluation of increase in migraine headaches over the last "few months. "She states that she's had similar headaches years ago, but they have gone away on their own. Pain is located more on the left side of her head, but sometimes located on the right. She describes the pain as a stabbing shooting pain sometimes in the frontal area and sometimes in her occipital area. She endorses "confusion" by sometimes reading things backwards when she has a headache making it difficult to focus. She has had ringing in the ears in the past, not pulsatile with headaches. tell with He sharp pains will sometimes progress to a dull ache. Zomig has helped with her headaches in the past. Laying down in a dark room/sleeping also has improved her headaches. Patient has endorsed nausea, chest tightness and also occasionally occurs with her headaches. She rates the sharp pain lasts about 5-10 seconds, and then headache will be throbbing for hours throughout the day. She believes she is now getting as many as 2 week. She  sometimes can get the sharp pain every couple of days, without headache to follow. She states when she has a migraine it can last up to about 6 hours on average. 6 hours on average.  Patient states some of her triggers may be poor sleep or stress. Currently she is getting ready to go back to school and has been filling out all of the paperwork that needs to be completed for that and has been stressing her out. She states all this work is now complete.   Depression screen PHQ 2/9 10/23/2015  Decreased Interest 0  Down, Depressed, Hopeless 0  PHQ - 2 Score 0    No Known Allergies Social History  Substance Use Topics  . Smoking status: Never Smoker  . Smokeless tobacco: Never Used  . Alcohol use 1.2 oz/week    2 Standard drinks or equivalent per week   Past Medical History:  Diagnosis Date  . Broken foot 08/2014   left  . Headache(784.0) 2016   migraine w/o aura  . Hyperlipidemia   . Migraines   . Plantar fasciitis of left foot   . Stress fracture of left foot 07/2015   confirmed with MRI   Past Surgical History:  Procedure Laterality Date  . INTRAUTERINE DEVICE (IUD) INSERTION     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/01/14   Family History  Problem Relation Age of Onset  . Arthritis Mother   . Colitis Mother   . Lung cancer Maternal Grandmother  smoker  . Cancer Maternal Grandmother        kidney  . Leukemia Paternal Grandmother        unsure correct type of cancer  . Heart failure Paternal Grandmother   . Alzheimer's disease Maternal Grandfather   . Kidney failure Paternal Grandfather        ? dialysis  . Heart disease Maternal Aunt        MI 55-56   Allergies as of 10/09/2016   No Known Allergies     Medication List       Accurate as of 10/09/16  9:44 AM. Always use your most recent med list.          amitriptyline 50 MG tablet Commonly known as:  ELAVIL Take 1 tablet (50 mg total) by mouth at bedtime.   meloxicam 15 MG tablet Commonly known as:  MOBIC Take 1  tablet (15 mg total) by mouth daily.   misoprostol 200 MCG tablet Commonly known as:  CYTOTEC Place 2 tablets vaginally 6-12 hours prior to procedure.   multivitamin tablet Take 1 tablet by mouth daily.   SKYLA 13.5 MG Iud Generic drug:  Levonorgestrel by Intrauterine route.   zolmitriptan 2.5 MG disintegrating tablet Commonly known as:  ZOMIG-ZMT Take 1 tablet (2.5 mg total) by mouth as needed for migraine (may take repeat dose 2 hours later x1).       All past medical history, surgical history, allergies, family history, immunizations andmedications were updated in the EMR today and reviewed under the history and medication portions of their EMR.     ROS: Negative, with the exception of above mentioned in HPI   Objective:  BP 124/87 (BP Location: Right Arm, Patient Position: Sitting, Cuff Size: Large)   Pulse 100   Temp 98.2 F (36.8 C)   Resp 20   Ht  (1.676 m)   Wt 234 lb (106.1 kg)   SpO2 97%   BMI 37.77 kg/m  Body mass index is 37.77 kg/m. Gen: Afebrile. No acute distress. Nontoxic in appearance, well developed, well nourished.  HENT: AT. [place].  MMM, no oral lesions. Eyes:Pupils Equal Round Reactive to light, Extraocular movements intact,  Conjunctiva without redness, discharge or icterus. CV: RRR, no edema Chest: CTAB, no wheeze or crackles. Good air movement, normal resp effort.  Skin: Less than 0.5 cm small abscess right inner thigh. Skin intact, no drainage. No rashes, purpura or petechiae.  Neuro:  Normal gait. PERLA. EOMi. Alert. Oriented x3  No exam data present No results found. No results found for this or any previous visit (from the past 24 hour(s)).  Assessment/Plan: Wendy Molina is a 31 y.o. female present for OV for  Migraine without aura and without status migrainosus, not intractable - Stable today. - refill amitriptyline 50 mg QHS, 90 day x 1 refill - Continue Zomig when necessary, refills provided for 6 months. -  follow  6  months.   Skin abscess: -New problem. Very small skin abscess, will provide antibiotic coverage for her today. Warm compresses. If does not resolve will need to follow-up and consider I &D.  Reviewed expectations re: course of current medical issues.  Discussed self-management of symptoms.  Outlined signs and symptoms indicating need for more acute intervention.  Patient verbalized understanding and all questions were answered.  Patient received an After-Visit Summary.    No orders of the defined types were placed in this encounter.    Note is dictated utilizing voice recognition software.  Although note has been proof read prior to signing, occasional typographical errors still can be missed. If any questions arise, please do not hesitate to call for verification.   electronically signed by:  Howard Pouch, DO  [place]

## 2016-10-09 NOTE — Patient Instructions (Addendum)
Refilled meds. Follow in 6 months.  Start keflex today, keep warm compresses on it (epson salt soaks)  If becomes fevered, draining or worsening will need to consider lancing.    Skin Abscess A skin abscess is an infected area on or under your skin that contains pus and other material. An abscess can happen almost anywhere on your body. Some abscesses break open (rupture) on their own. Most continue to get worse unless they are treated. The infection can spread deeper into the body and into your blood, which can make you feel sick. Treatment usually involves draining the abscess. Follow these instructions at home: Abscess Care  If you have an abscess that has not drained, place a warm, clean, wet washcloth over the abscess several times a day. Do this as told by your doctor.  Follow instructions from your doctor about how to take care of your abscess. Make sure you: ? Cover the abscess with a bandage (dressing). ? Change your bandage or gauze as told by your doctor. ? Wash your hands with soap and water before you change the bandage or gauze. If you cannot use soap and water, use hand sanitizer.  Check your abscess every day for signs that the infection is getting worse. Check for: ? More redness, swelling, or pain. ? More fluid or blood. ? Warmth. ? More pus or a bad smell. Medicines   Take over-the-counter and prescription medicines only as told by your doctor.  If you were prescribed an antibiotic medicine, take it as told by your doctor. Do not stop taking the antibiotic even if you start to feel better. General instructions  To avoid spreading the infection: ? Do not share personal care items, towels, or hot tubs with others. ? Avoid making skin-to-skin contact with other people.  Keep all follow-up visits as told by your doctor. This is important. Contact a doctor if:  You have more redness, swelling, or pain around your abscess.  You have more fluid or blood coming from  your abscess.  Your abscess feels warm when you touch it.  You have more pus or a bad smell coming from your abscess.  You have a fever.  Your muscles ache.  You have chills.  You feel sick. Get help right away if:  You have very bad (severe) pain.  You see red streaks on your skin spreading away from the abscess. This information is not intended to replace advice given to you by your health care provider. Make sure you discuss any questions you have with your health care provider. Document Released: 07/01/2007 Document Revised: 09/08/2015 Document Reviewed: 11/21/2014 Elsevier Interactive Patient Education  [employer].

## 2016-10-23 ENCOUNTER — Encounter: Payer: BLUE CROSS/BLUE SHIELD | Admitting: Family Medicine

## 2016-10-26 ENCOUNTER — Encounter: Payer: Self-pay | Admitting: Family Medicine

## 2016-10-26 ENCOUNTER — Ambulatory Visit (INDEPENDENT_AMBULATORY_CARE_PROVIDER_SITE_OTHER): Payer: BLUE CROSS/BLUE SHIELD | Admitting: Family Medicine

## 2016-10-26 VITALS — BP 119/87 | HR 98 | Temp 98.5°F | Resp 20 | Ht 66.0 in | Wt 232.8 lb

## 2016-10-26 DIAGNOSIS — Z130 Encounter for screening for diseases of the blood and blood-forming organs and certain disorders involving the immune mechanism: Secondary | ICD-10-CM

## 2016-10-26 DIAGNOSIS — Z79899 Other long term (current) drug therapy: Secondary | ICD-10-CM

## 2016-10-26 DIAGNOSIS — Z6837 Body mass index (BMI) 37.0-37.9, adult: Secondary | ICD-10-CM

## 2016-10-26 DIAGNOSIS — E559 Vitamin D deficiency, unspecified: Secondary | ICD-10-CM | POA: Diagnosis not present

## 2016-10-26 DIAGNOSIS — E785 Hyperlipidemia, unspecified: Secondary | ICD-10-CM | POA: Diagnosis not present

## 2016-10-26 DIAGNOSIS — Z0000 Encounter for general adult medical examination without abnormal findings: Secondary | ICD-10-CM

## 2016-10-26 LAB — CBC WITH DIFFERENTIAL/PLATELET
BASOS ABS: 0.1 10*3/uL (ref 0.0–0.1)
Basophils Relative: 1.5 % (ref 0.0–3.0)
EOS ABS: 0.1 10*3/uL (ref 0.0–0.7)
Eosinophils Relative: 1.1 % (ref 0.0–5.0)
HCT: 44.7 % (ref 36.0–46.0)
Hemoglobin: 15 g/dL (ref 12.0–15.0)
LYMPHS PCT: 36.5 % (ref 12.0–46.0)
Lymphs Abs: 2.8 10*3/uL (ref 0.7–4.0)
MCHC: 33.6 g/dL (ref 30.0–36.0)
MCV: 94.1 fl (ref 78.0–100.0)
Monocytes Absolute: 0.5 10*3/uL (ref 0.1–1.0)
Monocytes Relative: 7.1 % (ref 3.0–12.0)
NEUTROS ABS: 4.1 10*3/uL (ref 1.4–7.7)
NEUTROS PCT: 53.8 % (ref 43.0–77.0)
PLATELETS: 232 10*3/uL (ref 150.0–400.0)
RBC: 4.75 Mil/uL (ref 3.87–5.11)
RDW: 13.1 % (ref 11.5–15.5)
WBC: 7.6 10*3/uL (ref 4.0–10.5)

## 2016-10-26 LAB — COMPREHENSIVE METABOLIC PANEL
ALT: 15 U/L (ref 0–35)
AST: 15 U/L (ref 0–37)
Albumin: 4.1 g/dL (ref 3.5–5.2)
Alkaline Phosphatase: 66 U/L (ref 39–117)
BILIRUBIN TOTAL: 0.8 mg/dL (ref 0.2–1.2)
BUN: 13 mg/dL (ref 6–23)
CO2: 26 meq/L (ref 19–32)
CREATININE: 0.73 mg/dL (ref 0.40–1.20)
Calcium: 9.3 mg/dL (ref 8.4–10.5)
Chloride: 104 mEq/L (ref 96–112)
GFR: 98.21 mL/min (ref >60.00)
GLUCOSE: 91 mg/dL (ref 70–99)
Potassium: 4.4 mEq/L (ref 3.5–5.1)
SODIUM: 137 meq/L (ref 135–145)
Total Protein: 6.4 g/dL (ref 6.0–8.3)

## 2016-10-26 LAB — LIPID PANEL
CHOL/HDL RATIO: 3
Cholesterol: 140 mg/dL (ref 0–200)
HDL: 41.8 mg/dL (ref >39.00)
LDL Cholesterol: 66 mg/dL (ref 0–99)
NONHDL: 98.42
Triglycerides: 164 mg/dL — ABNORMAL HIGH (ref 0.0–149.0)
VLDL: 32.8 mg/dL (ref 0.0–40.0)

## 2016-10-26 LAB — VITAMIN D 25 HYDROXY (VIT D DEFICIENCY, FRACTURES): VITD: 25.39 ng/mL — AB (ref 30.00–100.00)

## 2016-10-26 LAB — TSH: TSH: 3.99 u[IU]/mL (ref 0.35–4.50)

## 2016-10-26 NOTE — Patient Instructions (Signed)

## 2016-10-26 NOTE — Progress Notes (Signed)
Patient ID: Wendy Molina, female  DOB: October 05, 1985, 31 y.o.   MRN: [id number] Patient Care Team    Relationship Specialty Notifications Start End  Ma Hillock, DO PCP - General Family Medicine  02/11/15   Nunzio Cobbs, MD Consulting Physician Obstetrics and Gynecology  10/26/16     Chief Complaint  Patient presents with  . Annual Exam    Subjective:  Wendy Molina is a 31 y.o.  Female  present for CPE . All past medical history, surgical history, allergies, family history, immunizations, medications and social history were updated in the electronic medical record today. All recent labs, ED visits and hospitalizations within the last year were reviewed.  Health maintenance:  Colonoscopy: Screen at 25, no FHX Mammogram: Screen at 40 , no Fhx  Cervical cancer screening: last pap: 07/2014, results: normal, completed by: Dr. Quincy Simmonds Immunizations: tdap 05/2012, Influenza declined (encouraged yearly) Infectious disease screening: HIV and Hep C completed/negative DEXA: N/A Assistive device: N/A Oxygen use:N/A Patient has a Dental home. Hospitalizations/ED visits: N/A  Depression screen [facility] 2/9 10/26/2016 10/23/2015  Decreased Interest 0 0  Down, Depressed, Hopeless 0 0  PHQ - 2 Score 0 0   No flowsheet data found.  Immunization History  Administered Date(s) Administered  . Tdap 06/22/2012    Past Medical History:  Diagnosis Date  . Broken foot 08/2014   left  . Headache(784.0) 2016   migraine w/o aura  . Hyperlipidemia   . Migraines   . Plantar fasciitis of left foot   . Stress fracture of left foot 07/2015   confirmed with MRI   No Known Allergies Past Surgical History:  Procedure Laterality Date  . INTRAUTERINE DEVICE (IUD) INSERTION     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/01/14   Family History  Problem Relation Age of Onset  . Arthritis Mother   . Colitis Mother   . Lung cancer Maternal Grandmother        smoker  . Cancer Maternal Grandmother     kidney  . Leukemia Paternal Grandmother        unsure correct type of cancer  . Heart failure Paternal Grandmother   . Alzheimer's disease Maternal Grandfather   . Kidney failure Paternal Grandfather        ? dialysis  . Heart disease Maternal Aunt        MI [id number]   Social History   Social History  . Marital status: Single    Spouse name: N/A  . Number of children: 0  . Years of education: N/A   Occupational History  . mortgage insurance    Social History Main Topics  . Smoking status: Never Smoker  . Smokeless tobacco: Never Used  . Alcohol use 1.2 oz/week    2 Standard drinks or equivalent per week  . Drug use: No  . Sexual activity: Yes    Partners: Male    Birth control/ protection: IUD     Comment: skyla--inserted 01-01-14   Other Topics Concern  . Not on file   Social History Narrative   Single- in a relationship. No children. 3 cats.    Works for [employer]   Take a daily vitamin   Wears her seatbelt, smoke detector in the home.   Hobbies: travel, reading, hiking   Feels safe in relationships   Allergies as of 10/26/2016   No Known Allergies     Medication List  Accurate as of 10/26/16  8:49 AM. Always use your most recent med list.          amitriptyline 50 MG tablet Commonly known as:  ELAVIL Take 1 tablet (50 mg total) by mouth at bedtime.   meloxicam 15 MG tablet Commonly known as:  MOBIC Take 1 tablet (15 mg total) by mouth daily.   misoprostol 200 MCG tablet Commonly known as:  CYTOTEC Place 2 tablets vaginally 6-12 hours prior to procedure.   multivitamin tablet Take 1 tablet by mouth daily.   SKYLA 13.5 MG Iud Generic drug:  Levonorgestrel by Intrauterine route.   zolmitriptan 2.5 MG disintegrating tablet Commonly known as:  ZOMIG-ZMT Take 1 tablet (2.5 mg total) by mouth as needed for migraine (may take repeat dose 2 hours later x1).            Discharge Care Instructions        Start      Ordered   10/26/16 0000  CBC w/Diff     10/26/16 0837   10/26/16 0000  Comp Met (CMET)     10/26/16 0837   10/26/16 0000  Lipid panel     10/26/16 0837   10/26/16 0000  Vitamin D (25 hydroxy)     10/26/16 0837   10/26/16 0000  TSH     10/26/16 0837   10/26/16 0000  HgB A1c     10/26/16 0837     All past medical history, surgical history, allergies, family history, immunizations andmedications were updated in the EMR today and reviewed under the history and medication portions of their EMR.     Recent Results (from the past 2160 hour(s))  HEP, RPR, HIV Panel     Status: None   Collection Time: 09/04/16  3:03 PM  Result Value Ref Range   Hepatitis B Surface Ag Negative Negative   RPR Ser Ql Non Reactive Non Reactive   HIV Screen 4th Generation wRfx Non Reactive Non Reactive  Hepatitis C antibody     Status: None   Collection Time: 09/04/16  3:03 PM  Result Value Ref Range   Hep C Virus Ab <0.1 0.0 - 0.9 s/co ratio    Comment:                                   Negative:     < 0.8                              Indeterminate: 0.8 - 0.9                                   Positive:     > 0.9  The CDC recommends that a positive HCV antibody result  be followed up with a HCV Nucleic Acid Amplification  test (546270).   Chlamydia/Gonococcus/Trichomonas, NAA     Status: None   Collection Time: 09/04/16  4:48 PM  Result Value Ref Range   Chlamydia by NAA Negative Negative   Gonococcus by NAA Negative Negative   Trich vag by NAA Negative Negative     ROS: 14 pt review of systems performed and negative (unless mentioned in an HPI)  Objective: BP 119/87 (BP Location: Left Arm, Patient Position: Sitting, Cuff Size: Large)   Pulse 98  Temp 98.5 F (36.9 C)   Resp 20   Ht 5' 6"  (1.676 m)   Wt 232 lb 12 oz (105.6 kg)   SpO2 97%   BMI 37.57 kg/m  Gen: Afebrile. No acute distress. Nontoxic in appearance, well-developed, well-nourished,  Pleasant caucasian female.  HENT: AT. [place].  Bilateral TM visualized and normal in appearance, normal external auditory canal. MMM, no oral lesions, adequate dentition. Bilateral nares within normal limits. Throat without erythema, ulcerations or exudates. no Cough on exam, no hoarseness on exam. Eyes:Pupils Equal Round Reactive to light, Extraocular movements intact,  Conjunctiva without redness, discharge or icterus. Neck/lymp/endocrine: Supple,no lymphadenopathy, no thyromegaly CV: RRR no murmur, no edema, +2/4 P posterior tibialis pulses. no carotid bruits. No JVD. Chest: CTAB, no wheeze, rhonchi or crackles. normal Respiratory effort. good Air movement. Abd: Soft. obese. NTND. BS present. no Masses palpated. No hepatosplenomegaly. No rebound tenderness or guarding. Skin: no rashes, purpura or petechiae. Warm and well-perfused. Skin intact. Neuro/Msk:  Normal gait. PERLA. EOMi. Alert. Oriented x3.  Cranial nerves II through XII intact. Muscle strength 5/5 upper/lower extremity. DTRs equal bilaterally. Psych: Normal affect, dress and demeanor. Normal speech. Normal thought content and judgment.   No exam data present  Assessment/plan: Wendy Molina is a 31 y.o. female present for CPE. Encounter for preventive health examination BMI 37.0-37.9, adult Patient was encouraged to exercise greater than 150 minutes a week. Patient was encouraged to choose a diet filled with fresh fruits and vegetables, and lean meats. AVS provided to patient today for education/recommendation on gender specific health and safety maintenance. Desired immunizations UTD.  PAP UTD.  ID screens completed.   Vitamin D deficiency - currently not taking daily supplement.  - Vitamin D (25 hydroxy)  Screening for iron deficiency anemia - CBC w/Diff  Hyperlipidemia, unspecified hyperlipidemia type - diet and exercise counseling.  - Comp Met (CMET) - Lipid panel - TSH - HgB A1c  Encounter for long-term current use of medication - amitriptyline and  zomig effective. Routine visits q 6 months  Return in about 1 year (around 10/26/2017) for CPE.  Electronically signed by: Howard Pouch, DO [place]

## 2016-10-27 LAB — HEMOGLOBIN A1C: HEMOGLOBIN A1C: 4.9 % (ref 4.6–6.5)

## 2016-11-26 HISTORY — PX: INTRAUTERINE DEVICE INSERTION: SHX323

## 2016-12-03 NOTE — Progress Notes (Signed)
GYNECOLOGY  VISIT   HPI: 31 y.o.   Single  Caucasian  female   G0P0000 with No LMP recorded. Patient is not currently having periods (Reason: IUD).   [facility] Kyleena [facility]UD insertion.    Her partner is here for the beginning of the consultation and excused for the actual procedure.  Took Motrin prior to procedure.  Used Cytotec last night and this morning.  Lost of cramping.   Bleeding every other day for the last 1.5 months.  Negative GC/CT 09/04/16.  UPT - negative.   GYNECOLOGIC HISTORY: No LMP recorded. Patient is not currently having periods (Reason: IUD). Contraception:  Skyla IUD inserted 01-01-14 Menopausal hormone therapy:  n/a Last mammogram:  n/a Last pap smear: 08-14-14 Neg:Neg HR HPV                             08-01-13 Neg OB History    Gravida Para Term Preterm AB Living   0 0 0 0 0 0   SAB TAB Ectopic Multiple Live Births   0 0 0 0 0         Patient Active Problem List   Diagnosis Date Noted  . Hyperlipidemia   . Encounter for long-term current use of medication 10/23/2015  . BMI 36.0-36.9,adult 10/23/2015  . Vitamin D deficiency 02/12/2015  . Migraine without aura     Past Medical History:  Diagnosis Date  . Broken foot 08/2014   left  . Headache(784.0) 2016   migraine w/o aura  . Hyperlipidemia   . Migraines   . Plantar fasciitis of left foot   . Stress fracture of left foot 07/2015   confirmed with MRI    Past Surgical History:  Procedure Laterality Date  . INTRAUTERINE DEVICE (IUD) INSERTION     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/01/14    Current Outpatient Medications  Medication Sig Dispense Refill  . amitriptyline (ELAVIL) 50 MG tablet Take 1 tablet (50 mg total) by mouth at bedtime. 90 tablet 1  . Levonorgestrel (SKYLA) 13.5 MG IUD by Intrauterine route.    . meloxicam (MOBIC) 15 MG tablet Take 1 tablet (15 mg total) by mouth daily. 30 tablet 3  . misoprostol (CYTOTEC) 200 MCG tablet Place 2 tablets vaginally 6-12 hours prior to  procedure. 2 tablet 0  . Multiple Vitamin (MULTIVITAMIN) tablet Take 1 tablet by mouth daily.    Marland Kitchen. zolmitriptan (ZOMIG-ZMT) 2.5 MG disintegrating tablet Take 1 tablet (2.5 mg total) by mouth as needed for migraine (may take repeat dose 2 hours later x1). 10 tablet 5   No current facility-administered medications for this visit.      ALLERGIES: Patient has no known allergies.  Family History  Problem Relation Age of Onset  . Arthritis Mother   . Colitis Mother   . Lung cancer Maternal Grandmother        smoker  . Cancer Maternal Grandmother        kidney  . Leukemia Paternal Grandmother        unsure correct type of cancer  . Heart failure Paternal Grandmother   . Alzheimer's disease Maternal Grandfather   . Kidney failure Paternal Grandfather        ? dialysis  . Heart disease Maternal Aunt        MI [id number]    Social History   Socioeconomic History  . Marital status: Single    Spouse name: Not on file  .  Number of children: 0  . Years of education: Not on file  . Highest education level: Not on file  Social Needs  . Financial resource strain: Not on file  . Food insecurity - worry: Not on file  . Food insecurity - inability: Not on file  . Transportation needs - medical: Not on file  . Transportation needs - non-medical: Not on file  Occupational History  . Occupation: [employer]  Tobacco Use  . Smoking status: Never Smoker  . Smokeless tobacco: Never Used  Substance and Sexual Activity  . Alcohol use: Yes    Alcohol/week: 1.2 oz    Types: 2 Standard drinks or equivalent per week  . Drug use: No  . Sexual activity: Yes    Partners: Male    Birth control/protection: IUD    Comment: skyla--inserted 01-01-14  Other Topics Concern  . Not on file  Social History Narrative   Single- in a relationship. No children. 3 cats.    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   Hobbies:  travel, reading, hiking   Feels safe in relationships    ROS:  Pertinent items are noted in HPI.  PHYSICAL EXAMINATION:    BP 108/60 (BP Location: Right Arm, Patient Position: Sitting, Cuff Size: Large)   Pulse 88   Ht 5' 5.5" (1.664 m)   Wt 232 lb 9.6 oz (105.5 kg)   BMI 38.12 kg/m     General - NAD.    Pelvic: External genitalia:  no lesions              Urethra:  normal appearing urethra with no masses, tenderness or lesions              Bartholins and Skenes: normal                 Vagina: normal appearing vagina with normal color and discharge, no lesions              Cervix: no lesions                Bimanual Exam:  Uterus:  normal size, contour, position, consistency, mobility, non-tender              Adnexa: no mass, fullness, tenderness             IUD removal and insertion of new Kyleena IUD.  Consent for procedures.  Kyleena Lot number [id number], expiration Jan 2021.  Sterile prep with Hibiclens. IUD removed with ring forceps, intact, shown to patient, and discarded. Paracervical block with 10 cc 1% lidocaine - lot [id number], expiration 11/21. Tenaculum to anterior cervical lip. Uterus sounded to 7.5 cm.  IUD placed without difficulty.  Strings trimmed.  No complications.  Minimal EBL.  Repeat BM exam with no change.   Chaperone was present for exam.  ASSESSMENT  Removal of Sklya IUD and placement of Kyleena IUD.  PLAN  IUD card to patient.  Post IUD instructions and precautions to patient.  Back up protection for one week at least.  Follow up in 4 weeks.    An After Visit Summary was printed and given to the patient.

## 2016-12-04 ENCOUNTER — Encounter: Payer: Self-pay | Admitting: Obstetrics and Gynecology

## 2016-12-04 ENCOUNTER — Other Ambulatory Visit: Payer: Self-pay

## 2016-12-04 ENCOUNTER — Ambulatory Visit (INDEPENDENT_AMBULATORY_CARE_PROVIDER_SITE_OTHER): Payer: BLUE CROSS/BLUE SHIELD | Admitting: Obstetrics and Gynecology

## 2016-12-04 VITALS — BP 108/60 | HR 88 | Ht 65.5 in | Wt 232.6 lb

## 2016-12-04 DIAGNOSIS — Z3009 Encounter for other general counseling and advice on contraception: Secondary | ICD-10-CM | POA: Diagnosis not present

## 2016-12-04 DIAGNOSIS — Z30433 Encounter for removal and reinsertion of intrauterine contraceptive device: Secondary | ICD-10-CM

## 2016-12-04 DIAGNOSIS — Z01812 Encounter for preprocedural laboratory examination: Secondary | ICD-10-CM

## 2016-12-04 HISTORY — PX: INTRAUTERINE DEVICE INSERTION: SHX323

## 2016-12-04 LAB — POCT URINE PREGNANCY: PREG TEST UR: NEGATIVE

## 2016-12-04 NOTE — Patient Instructions (Signed)

## 2017-01-01 ENCOUNTER — Ambulatory Visit: Payer: BLUE CROSS/BLUE SHIELD | Admitting: Obstetrics and Gynecology

## 2017-01-05 ENCOUNTER — Telehealth: Payer: Self-pay

## 2017-01-05 NOTE — Telephone Encounter (Signed)
Tried calling patient to reschedule appointment from 01/07/17, no answer. Message left for patient to return my call.

## 2017-01-06 ENCOUNTER — Ambulatory Visit: Payer: BLUE CROSS/BLUE SHIELD | Admitting: Obstetrics and Gynecology

## 2017-01-06 NOTE — Telephone Encounter (Signed)
Patient on the schedule to see Dr. Oscar LaJertson 01/07/17. Closing this encounter.

## 2017-01-07 ENCOUNTER — Other Ambulatory Visit: Payer: Self-pay

## 2017-01-07 ENCOUNTER — Ambulatory Visit: Payer: BLUE CROSS/BLUE SHIELD | Admitting: Obstetrics and Gynecology

## 2017-01-07 ENCOUNTER — Encounter: Payer: Self-pay | Admitting: Obstetrics and Gynecology

## 2017-01-07 VITALS — BP 122/80 | HR 88 | Resp 16 | Wt 236.0 lb

## 2017-01-07 DIAGNOSIS — Z30431 Encounter for routine checking of intrauterine contraceptive device: Secondary | ICD-10-CM

## 2017-01-07 NOTE — Progress Notes (Signed)
GYNECOLOGY  VISIT   HPI: 31 y.o.   Single  Caucasian  female   G0P0000 with No LMP recorded. Patient is not currently having periods (Reason: IUD).   here for IUD check. She had a skyla removed and a kyleena inserted last month. Doing well over all. She had cramping for a week and a half after insertion. She then woke up in the middle of the night on Monday night with severe cramping. Pain resolved in 24 hours. Currently fine. No current dyspareunia. No bleeding since a few days after insertion.   GYNECOLOGIC HISTORY: No LMP recorded. Patient is not currently having periods (Reason: IUD). Contraception:IUD (Kyleena)  Menopausal hormone therapy: none         OB History    Gravida Para Term Preterm AB Living   0 0 0 0 0 0   SAB TAB Ectopic Multiple Live Births   0 0 0 0 0         Patient Active Problem List   Diagnosis Date Noted  . Hyperlipidemia   . Encounter for long-term current use of medication 10/23/2015  . BMI 36.0-36.9,adult 10/23/2015  . Vitamin D deficiency 02/12/2015  . Migraine without aura     Past Medical History:  Diagnosis Date  . Broken foot 08/2014   left  . Headache(784.0) 2016   migraine w/o aura  . Hyperlipidemia   . Migraines   . Plantar fasciitis of left foot   . Stress fracture of left foot 07/2015   confirmed with MRI    Past Surgical History:  Procedure Laterality Date  . INTRAUTERINE DEVICE (IUD) INSERTION     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/01/14  . INTRAUTERINE DEVICE INSERTION  11/2016   Kyleena     Current Outpatient Medications  Medication Sig Dispense Refill  . amitriptyline (ELAVIL) 50 MG tablet Take 1 tablet (50 mg total) by mouth at bedtime. 90 tablet 1  . Levonorgestrel (KYLEENA) 19.5 MG IUD by Intrauterine route.    . meloxicam (MOBIC) 15 MG tablet Take 1 tablet (15 mg total) by mouth daily. 30 tablet 3  . Multiple Vitamin (MULTIVITAMIN) tablet Take 1 tablet by mouth daily.    Marland Kitchen. zolmitriptan (ZOMIG-ZMT) 2.5 MG disintegrating tablet Take 1  tablet (2.5 mg total) by mouth as needed for migraine (may take repeat dose 2 hours later x1). 10 tablet 5   No current facility-administered medications for this visit.      ALLERGIES: Patient has no known allergies.  Family History  Problem Relation Age of Onset  . Arthritis Mother   . Colitis Mother   . Lung cancer Maternal Grandmother        smoker  . Cancer Maternal Grandmother        kidney  . Leukemia Paternal Grandmother        unsure correct type of cancer  . Heart failure Paternal Grandmother   . Alzheimer's disease Maternal Grandfather   . Kidney failure Paternal Grandfather        ? dialysis  . Heart disease Maternal Aunt        MI [id number]    Social History   Socioeconomic History  . Marital status: Single    Spouse name: Not on file  . Number of children: 0  . Years of education: Not on file  . Highest education level: Not on file  Social Needs  . Financial resource strain: Not on file  . Food insecurity - worry: Not on file  . Food insecurity -  inability: Not on file  . Transportation needs - medical: Not on file  . Transportation needs - non-medical: Not on file  Occupational History  . Occupation: [employer]  Tobacco Use  . Smoking status: Never Smoker  . Smokeless tobacco: Never Used  Substance and Sexual Activity  . Alcohol use: Yes    Alcohol/week: 1.2 oz    Types: 2 Standard drinks or equivalent per week  . Drug use: No  . Sexual activity: Yes    Partners: Male    Birth control/protection: IUD    Comment: skyla--inserted 01-01-14  Other Topics Concern  . Not on file  Social History Narrative   Single- in a relationship. No children. 3 cats.    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   Hobbies: travel, reading, hiking   Feels safe in relationships    Review of Systems  Constitutional: Negative.   HENT: Negative.   Eyes: Negative.   Respiratory:  Negative.   Cardiovascular: Negative.   Gastrointestinal: Negative.   Genitourinary:       Abdominal cramping on and off  Musculoskeletal: Negative.   Skin: Negative.   Neurological: Negative.   Endo/Heme/Allergies: Negative.   Psychiatric/Behavioral: Negative.     PHYSICAL EXAMINATION:    BP 122/80 (BP Location: Right Arm, Patient Position: Sitting, Cuff Size: Normal)   Pulse 88   Resp 16   Wt 236 lb (107 kg)   BMI 38.68 kg/m     General appearance: alert, cooperative and appears stated age  Pelvic: External genitalia:  no lesions              Urethra:  normal appearing urethra with no masses, tenderness or lesions              Bartholins and Skenes: normal                 Vagina: normal appearing vagina with normal color and discharge, no lesions              Cervix: no lesions and IUD string 3 cm              Bimanual Exam:  Uterus:  normal size, contour, position, consistency, mobility, non-tender              Adnexa: no mass, fullness, tenderness               Chaperone was present for exam.  ASSESSMENT IUD check One day of severe cramping, normal exam today, suspect she had a cyst    PLAN Routine f/u Call with recurrent pain, would set her up for an ultrasound   An After Visit Summary was printed and given to the patient.

## 2017-03-14 ENCOUNTER — Other Ambulatory Visit: Payer: Self-pay | Admitting: Podiatry

## 2017-04-26 ENCOUNTER — Ambulatory Visit: Payer: BLUE CROSS/BLUE SHIELD | Admitting: Family Medicine

## 2017-04-27 ENCOUNTER — Ambulatory Visit: Payer: BLUE CROSS/BLUE SHIELD | Admitting: Family Medicine

## 2017-04-27 ENCOUNTER — Encounter: Payer: Self-pay | Admitting: Family Medicine

## 2017-04-27 VITALS — BP 122/83 | HR 90 | Temp 97.7°F | Resp 20 | Ht 65.5 in | Wt 240.0 lb

## 2017-04-27 DIAGNOSIS — G43009 Migraine without aura, not intractable, without status migrainosus: Secondary | ICD-10-CM | POA: Diagnosis not present

## 2017-04-27 DIAGNOSIS — Z6836 Body mass index (BMI) 36.0-36.9, adult: Secondary | ICD-10-CM

## 2017-04-27 MED ORDER — AMITRIPTYLINE HCL 50 MG PO TABS: 50.0000 mg | ORAL_TABLET | Freq: Every day | ORAL | 1 refills | Status: DC

## 2017-04-27 NOTE — Patient Instructions (Addendum)
Follow up for CPE and will refill migraine meds for a year during CPE, as long as doing well.    Please help us help you:  We are honored [facility]. Below you will find basic instructions that you may need to access in the future. Please help us help you by reading the instructions, which cover many of the frequent questions we experience.   Prescription refills and request:  -In order to allow more efficient response time, please call your pharmacy for all refills. They will forward the request electronically to us. This allows for the quickest possible response. Request left on a nurse line can take longer to refill, since these are checked as time allows between office patients and other phone calls.  - refill request can take up to 3-5 working days to complete.  - If request is sent electronically and request is appropiate, it is usually completed in 1-2 business days.  - all patients will need to be seen routinely for all chronic medical conditions requiring prescription medications (see follow-up below). If you are overdue for follow up on your condition, you will be asked to make an appointment and we will call in enough medication to cover you until your appointment (up to 30 days).  - all controlled substances will require a face to face visit to request/refill.  - if you desire your prescriptions to go through a new pharmacy, and have an active script at original pharmacy, you will need to call your pharmacy and have scripts transferred to new pharmacy. This is completed between the pharmacy locations and not by your provider.    Results: If any images or labs were ordered, it can take up to 1 week to get results depending on the test ordered and the lab/facility running and resulting the test. - Normal or stable results, which do not need further discussion, may be released to your mychart immediately with attached note to you. A call may  not be generated for normal results. Please make certain to sign up for mychart. If you have questions on how to activate your mychart you can call the front office.  - If your results need further discussion, our office will attempt to contact you via phone, and if unable to reach you after 2 attempts, we will release your abnormal result to your mychart with instructions.  - All results will be automatically released in mychart after 1 week.  - Your provider will provide you with explanation and instruction on all relevant material in your results. Please keep in mind, results and labs may appear confusing or abnormal to the untrained eye, but it does not mean they are actually abnormal for you personally. If you have any questions about your results that are not covered, or you desire more detailed explanation than what was provided, you should make an appointment with your provider to do so.   Our office handles many outgoing and incoming calls daily. If we have not contacted you within 1 week about your results, please check your mychart to see if there is a message first and if not, then contact our office.  In helping with this matter, you help decrease call volume, and therefore allow us to be able to respond to patients needs more efficiently.   Acute office visits (sick visit):  An acute visit is intended for a new problem and are scheduled in shorter time slots to allow schedule  openings for patients with new problems. This is the appropriate visit to discuss a new problem. In order to provide you with excellent quality medical care with proper time for you to explain your problem, have an exam and receive treatment with instructions, these appointments should be limited to one new problem per visit. If you experience a new problem, in which you desire to be addressed, please make an acute office visit, we save openings on the schedule to accommodate you. Please do not save your new problem for  any other type of visit, let us take care of it properly and quickly for you.   Follow up visits:  Depending on your condition(s) your provider will need to see you routinely in order to provide you with quality care and prescribe medication(s). Most chronic conditions (Example: hypertension, Diabetes, depression/anxiety... etc), require visits a couple times a year. Your provider will instruct you on proper follow up for your personal medical conditions and history. Please make certain to make follow up appointments for your condition as instructed. Failing to do so could result in lapse in your medication treatment/refills. If you request a refill, and are overdue to be seen on a condition, we will always provide you with a 30 day script (once) to allow you time to schedule.    Medicare wellness (well visit): - we have a wonderful Nurse Maudie Mercury), that will meet with you and provide you will yearly medicare wellness visits. These visits should occur yearly (can not be scheduled less than 1 calendar year apart) and cover preventive health, immunizations, advance directives and screenings you are entitled to yearly through your medicare benefits. Do not miss out on your entitled benefits, this is when medicare will pay for these benefits to be ordered for you.  These are strongly encouraged by your provider and is the appropriate type of visit to make certain you are up to date with all preventive health benefits. If you have not had your medicare wellness exam in the last 12 months, please make certain to schedule one by calling the office and schedule your medicare wellness with Maudie Mercury as soon as possible.   Yearly physical (well visit):  - Adults are recommended to be seen yearly for physicals. Check with your insurance and date of your last physical, most insurances require one calendar year between physicals. Physicals include all preventive health topics, screenings, medical exam and labs that are  appropriate for gender/age and history. You may have fasting labs needed at this visit. This is a well visit (not a sick visit), new problems should not be covered during this visit (see acute visit).  - Pediatric patients are seen more frequently when they are younger. Your provider will advise you on well child visit timing that is appropriate for your their age. - This is not a medicare wellness visit. Medicare wellness exams do not have an exam portion to the visit. Some medicare companies allow for a physical, some do not allow a yearly physical. If your medicare allows a yearly physical you can schedule the medicare wellness with our nurse Maudie Mercury and have your physical with your provider after, on the same day. Please check with insurance for your full benefits.   Late Policy/No Shows:  - all new patients should arrive 15-30 minutes earlier than appointment to allow [place] time  to  obtain all personal demographics,  insurance information and for you to complete office paperwork. - All established patients should arrive 10-15 minutes earlier than  appointment time to update all information and be checked in .  - In our best efforts to run on time, if you are late for your appointment you will be asked to either reschedule or if able, we will work you back into the schedule. There will be a wait time to work you back in the schedule,  depending on availability.  - If you are unable to make it to your appointment as scheduled, please call 24 hours ahead of time to allow [place] to fill the time slot with someone else who needs to be seen. If you do not cancel your appointment ahead of time, you may be charged a no show fee.

## 2017-04-27 NOTE — Progress Notes (Signed)
Wendy Molina , 05-Jun-1985, 32 y.o., female MRN: [id number] Patient Care Team    Relationship Specialty Notifications Start End  Natalia Leatherwood, DO PCP - General Family Medicine  02/11/15   Patton Salles, MD Consulting Physician Obstetrics and Gynecology  10/26/16     Chief Complaint  Patient presents with  . Migraine    6 month follow up     Subjective:   Migraine Patient reports compliance with her amitriptyline 50 mg before bed. She states she has not used any  of her breakthrough medications. She is doing rather well with medication and would like to continue.  Original note 02/11/2015:  Patient presents for evaluation of increase in migraine headaches over the last "few months. "She states that she's had similar headaches years ago, but they have gone away on their own. Pain is located more on the left side of her head, but sometimes located on the right. She describes the pain as a stabbing shooting pain sometimes in the frontal area and sometimes in her occipital area. She endorses "confusion" by sometimes reading things backwards when she has a headache making it difficult to focus. She has had ringing in the ears in the past, not pulsatile with headaches. tell with He sharp pains will sometimes progress to a dull ache. Zomig has helped with her headaches in the past. Laying down in a dark room/sleeping also has improved her headaches. Patient has endorsed nausea, chest tightness and also occasionally occurs with her headaches. She rates the sharp pain lasts about 5-10 seconds, and then headache will be throbbing for hours throughout the day. She believes she is now getting as many as 2 week. She sometimes can get the sharp pain every couple of days, without headache to follow. She states when she has a migraine it can last up to about 6 hours on average. 6 hours on average.  Patient states some of her triggers may be poor sleep or stress. Currently she is getting  ready to go back to school and has been filling out all of the paperwork that needs to be completed for that and has been stressing her out. She states all this work is now complete.   Depression screen [facility] 2/9 10/26/2016 10/23/2015  Decreased Interest 0 0  Down, Depressed, Hopeless 0 0  PHQ - 2 Score 0 0    No Known Allergies Social History   Tobacco Use  . Smoking status: Never Smoker  . Smokeless tobacco: Never Used  Substance Use Topics  . Alcohol use: Yes    Alcohol/week: 1.2 oz    Types: 2 Standard drinks or equivalent per week   Past Medical History:  Diagnosis Date  . Broken foot 08/2014   left  . Headache(784.0) 2016   migraine w/o aura  . Hyperlipidemia   . Migraines   . Plantar fasciitis of left foot   . Stress fracture of left foot 07/2015   confirmed with MRI   Past Surgical History:  Procedure Laterality Date  . INTRAUTERINE DEVICE (IUD) INSERTION     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/01/14  . INTRAUTERINE DEVICE INSERTION  11/2016   Kyleena    Family History  Problem Relation Age of Onset  . Arthritis Mother   . Colitis Mother   . Lung cancer Maternal Grandmother        smoker  . Cancer Maternal Grandmother        kidney  . Leukemia Paternal Grandmother  unsure correct type of cancer  . Heart failure Paternal Grandmother   . Alzheimer's disease Maternal Grandfather   . Kidney failure Paternal Grandfather        ? dialysis  . Heart disease Maternal Aunt        MI 55-56   Allergies as of 04/27/2017   No Known Allergies     Medication List        Accurate as of 04/27/17  8:43 AM. Always use your most recent med list.          amitriptyline 50 MG tablet Commonly known as:  ELAVIL Take 1 tablet (50 mg total) by mouth at bedtime.   KYLEENA 19.5 MG Iud Generic drug:  Levonorgestrel by Intrauterine route.   meloxicam 15 MG tablet Commonly known as:  MOBIC Take 1 tablet (15 mg total) by mouth daily.   multivitamin tablet Take 1 tablet by mouth  daily.   zolmitriptan 2.5 MG disintegrating tablet Commonly known as:  ZOMIG-ZMT Take 1 tablet (2.5 mg total) by mouth as needed for migraine (may take repeat dose 2 hours later x1).       All past medical history, surgical history, allergies, family history, immunizations andmedications were updated in the EMR today and reviewed under the history and medication portions of their EMR.     ROS: Negative, with the exception of above mentioned in HPI   Objective:  BP 122/83 (BP Location: Right Arm, Patient Position: Sitting, Cuff Size: Large)   Pulse 90   Temp 97.7 F (36.5 C)   Resp 20   Ht 5' 5.5" (1.664 m)   Wt 240 lb (108.9 kg)   SpO2 98%   BMI 39.33 kg/m  Body mass index is 39.33 kg/m. Gen: Afebrile. No acute distress.  HENT: AT. [place]. MMM.  Eyes:Pupils Equal Round Reactive to light, Extraocular movements intact,  Conjunctiva without redness, discharge or icterus. CV: RRR Chest: CTAB, no wheeze or crackles Neuro:  Normal gait. PERLA. EOMi. Alert. Oriented x3  Psych: Normal affect, dress and demeanor. Normal speech. Normal thought content and judgment.  No exam data present No results found. No results found for this or any previous visit (from the past 24 hour(s)).  Assessment/Plan: Clance BollKatherine L Maslow is a 32 y.o. female present for OV for  Migraine without aura and without status migrainosus, not intractable - Stable today. - refill amitriptyline 50 mg QHS, 90 day x 1 refill - Continue Zomig when necessary, refills not needed today -  follow  6 months for CPE (will refill migraine meds yearly w/ CPE as long as doing well)    Reviewed expectations re: course of current medical issues.  Discussed self-management of symptoms.  Outlined signs and symptoms indicating need for more acute intervention.  Patient verbalized understanding and all questions were answered.  Patient received an After-Visit Summary.   > 15 minutes spent with patient, >50% of time spent  face to face counseling   No orders of the defined types were placed in this encounter.    Note is dictated utilizing voice recognition software. Although note has been proof read prior to signing, occasional typographical errors still can be missed. If any questions arise, please do not hesitate to call for verification.   electronically signed by:  Felix Pacinienee Laurabelle Gorczyca, DO  [facility] [place]

## 2017-09-17 ENCOUNTER — Other Ambulatory Visit: Payer: Self-pay

## 2017-09-17 ENCOUNTER — Ambulatory Visit: Payer: BLUE CROSS/BLUE SHIELD | Admitting: Obstetrics and Gynecology

## 2017-09-17 ENCOUNTER — Encounter: Payer: Self-pay | Admitting: Obstetrics and Gynecology

## 2017-09-17 ENCOUNTER — Other Ambulatory Visit (HOSPITAL_COMMUNITY)
Admission: RE | Admit: 2017-09-17 | Discharge: 2017-09-17 | Disposition: A | Discharge status: Home / Self Care | Payer: BLUE CROSS/BLUE SHIELD | Source: Ambulatory Visit | Attending: Obstetrics and Gynecology | Admitting: Obstetrics and Gynecology

## 2017-09-17 VITALS — BP 128/80 | HR 92 | Resp 16 | Ht 66.0 in | Wt 247.0 lb

## 2017-09-17 DIAGNOSIS — Z01419 Encounter for gynecological examination (general) (routine) without abnormal findings: Secondary | ICD-10-CM | POA: Insufficient documentation

## 2017-09-17 DIAGNOSIS — R42 Dizziness and giddiness: Secondary | ICD-10-CM | POA: Diagnosis not present

## 2017-09-17 DIAGNOSIS — N926 Irregular menstruation, unspecified: Secondary | ICD-10-CM

## 2017-09-17 DIAGNOSIS — Z1151 Encounter for screening for human papillomavirus (HPV): Secondary | ICD-10-CM | POA: Insufficient documentation

## 2017-09-17 LAB — POCT URINE PREGNANCY: Preg Test, Ur: NEGATIVE

## 2017-09-17 NOTE — Patient Instructions (Signed)
EXERCISE AND DIET:  We recommended that you start or continue a regular exercise program for good health. Regular exercise means any activity that makes your heart beat faster and makes you sweat.  We recommend exercising at least 30 minutes per day at least 3 days a week, preferably 4 or 5.  We also recommend a diet low in fat and sugar.  Inactivity, poor dietary choices and obesity can cause diabetes, heart attack, stroke, and kidney damage, among others.    ALCOHOL AND SMOKING:  Women should limit their alcohol intake to no more than 7 drinks/beers/glasses of wine (combined, not each!) per week. Moderation of alcohol intake to this level decreases your risk of breast cancer and liver damage. And of course, no recreational drugs are part of a healthy lifestyle.  And absolutely no smoking or even second hand smoke. Most people know smoking can cause heart and lung diseases, but did you know it also contributes to weakening of your bones? Aging of your skin?  Yellowing of your teeth and nails?  CALCIUM AND VITAMIN D:  Adequate intake of calcium and Vitamin D are recommended.  The recommendations for exact amounts of these supplements seem to change often, but generally speaking 600 mg of calcium (either carbonate or citrate) and 800 units of Vitamin D per day seems prudent. Certain women may benefit from higher intake of Vitamin D.  If you are among these women, your doctor will have told you during your visit.    PAP SMEARS:  Pap smears, to check for cervical cancer or precancers,  have traditionally been done yearly, although recent scientific advances have shown that most women can have pap smears less often.  However, every woman still should have a physical exam from her gynecologist every year. It will include a breast check, inspection of the vulva and vagina to check for abnormal growths or skin changes, a visual exam of the cervix, and then an exam to evaluate the size and shape of the uterus and  ovaries.  And after 33 years of age, a rectal exam is indicated to check for rectal cancers. We will also provide age appropriate advice regarding health maintenance, like when you should have certain vaccines, screening for sexually transmitted diseases, bone density testing, colonoscopy, mammograms, etc.   MAMMOGRAMS:  All women over 40 years old should have a yearly mammogram. Many facilities now offer a "3D" mammogram, which may cost around $50 extra out of pocket. If possible,  we recommend you accept the option to have the 3D mammogram performed.  It both reduces the number of women who will be called back for extra views which then turn out to be normal, and it is better than the routine mammogram at detecting truly abnormal areas.    COLONOSCOPY:  Colonoscopy to screen for colon cancer is recommended for all women at age 50.  We know, you hate the idea of the prep.  We agree, BUT, having colon cancer and not knowing it is worse!!  Colon cancer so often starts as a polyp that can be seen and removed at colonscopy, which can quite literally save your life!  And if your first colonoscopy is normal and you have no family history of colon cancer, most women don't have to have it again for 10 years.  Once every ten years, you can do something that may end up saving your life, right?  We will be happy to help you get it scheduled when you are ready.    Be sure to check your insurance coverage so you understand how much it will cost.  It may be covered as a preventative service at no cost, but you should check your particular policy.     HPV (Human Papillomavirus) Vaccine: What You Need to Know 1. Why get vaccinated? HPV vaccine prevents infection with human papillomavirus (HPV) types that are associated with many cancers, including:  cervical cancer in females,  vaginal and vulvar cancers in females,  anal cancer in females and males,  throat cancer in females and males, and  penile cancer in  males.  In addition, HPV vaccine prevents infection with HPV types that cause genital warts in both females and males. In the [place] about 12,000 women get cervical cancer every year, and about 4,000 women die from it. HPV vaccine can prevent most of these cases of cervical cancer. Vaccination is not a substitute for cervical cancer screening. This vaccine does not protect against all HPV types that can cause cervical cancer. Women should still get regular Pap tests. HPV infection usually comes from sexual contact, and most people will become infected at some point in their life. About 14 million Americans, including teens, get infected every year. Most infections will go away on their own and not cause serious problems. But thousands of women and men get cancer and other diseases from HPV. 2. HPV vaccine HPV vaccine is approved by FDA and is recommended by CDC for both males and females. It is routinely given at 11 or 33 years of age, but it may be given beginning at age 9 years through age 26 years. Most adolescents 9 through 33 years of age should get HPV vaccine as a two-dose series with the doses separated by 6-12 months. People who start HPV vaccination at 15 years of age and older should get the vaccine as a three-dose series with the second dose given 1-2 months after the first dose and the third dose given 6 months after the first dose. There are several exceptions to these age recommendations. Your health care provider can give you more information. 3. Some people should not get this vaccine  Anyone who has had a severe (life-threatening) allergic reaction to a dose of HPV vaccine should not get another dose.  Anyone who has a severe (life threatening) allergy to any component of HPV vaccine should not get the vaccine.  Tell your doctor if you have any severe allergies that you know of, including a severe allergy to yeast.  HPV vaccine is not recommended for pregnant women. If you learn  that you were pregnant when you were vaccinated, there is no reason to expect any problems for you or your baby. Any woman who learns she was pregnant when she got HPV vaccine is encouraged to contact the manufacturer's registry for HPV vaccination during pregnancy at 1-800-986-8999. Women who are breastfeeding may be vaccinated.  If you have a mild illness, such as a cold, you can probably get the vaccine today. If you are moderately or severely ill, you should probably wait until you recover. Your doctor can advise you. 4. Risks of a vaccine reaction With any medicine, including vaccines, there is a chance of side effects. These are usually mild and go away on their own, but serious reactions are also possible. Most people who get HPV vaccine do not have any serious problems with it. Mild or moderate problems following HPV vaccine:  Reactions in the arm where the shot was given: ? Soreness (about   9 people in 10) ? Redness or swelling (about 1 person in 3)  Fever: ? Mild (100F) (about 1 person in 10) ? Moderate (102F) (about 1 person in 65)  Other problems: ? Headache (about 1 person in 3) Problems that could happen after any injected vaccine:  People sometimes faint after a medical procedure, including vaccination. Sitting or lying down for about 15 minutes can help prevent fainting, and injuries caused by a fall. Tell your doctor if you feel dizzy, or have vision changes or ringing in the ears.  Some people get severe pain in the shoulder and have difficulty moving the arm where a shot was given. This happens very rarely.  Any medication can cause a severe allergic reaction. Such reactions from a vaccine are very rare, estimated at about 1 in a million doses, and would happen within a few minutes to a few hours after the vaccination. As with any medicine, there is a very remote chance of a vaccine causing a serious injury or death. The safety of vaccines is always being monitored. For  more information, visit: www.cdc.gov/vaccinesafety/. 5. What if there is a serious reaction? What should I look for? Look for anything that concerns you, such as signs of a severe allergic reaction, very high fever, or unusual behavior. Signs of a severe allergic reaction can include hives, swelling of the face and throat, difficulty breathing, a fast heartbeat, dizziness, and weakness. These would usually start a few minutes to a few hours after the vaccination. What should I do? If you think it is a severe allergic reaction or other emergency that can't wait, call 9-1-1 or get to the nearest hospital. Otherwise, call your doctor. Afterward, the reaction should be reported to the Vaccine Adverse Event Reporting System (VAERS). Your doctor should file this report, or you can do it yourself through the VAERS web site at www.vaers.hhs.gov, or by calling 1-800-822-7967. VAERS does not give medical advice. 6. The National Vaccine Injury Compensation Program The National Vaccine Injury Compensation Program (VICP) is a federal program that was created to compensate people who may have been injured by certain vaccines. Persons who believe they may have been injured by a vaccine can learn about the program and about filing a claim by calling 1-800-338-2382 or visiting the VICP website at www.hrsa.gov/vaccinecompensation. There is a time limit to file a claim for compensation. 7. How can I learn more?  Ask your health care provider. He or she can give you the vaccine package insert or suggest other sources of information.  Call your local or state health department.  Contact the Centers for Disease Control and Prevention (CDC): ? Call 1-800-232-4636 (1-800-CDC-INFO) or ? Visit CDC's website at www.cdc.gov/hpv Vaccine Information Statement, HPV Vaccine (12/28/2014) This information is not intended to replace advice given to you by your health care provider. Make sure you discuss any questions you have  with your health care provider. Document Released: 08/09/2013 Document Revised: 10/03/2015 Document Reviewed: 10/03/2015 Elsevier Interactive Patient Education  2017 [employer]  

## 2017-09-17 NOTE — Progress Notes (Signed)
32 y.o. G0P0000 Single Caucasian female here for annual exam.    Happy with Kyleena.  Likes Kyleena better than [employer].  Has less cramping.  Cycles last for a day or so if she has one.   Random dizziness for one month.  Not every day. Hx of vertigo.   Studying forensic accounting.   Steady partner for 3.5 yrs.  Bought a house together.   Has appt with PCP in October.   PCP: Felix Pacini, DO  Patient's last menstrual period was 08/05/2017.           Sexually active: Yes.    The current method of family planning is [place] IUD inserted 01/07/17.    Exercising: No.  The patient does not participate in regular exercise at present. Smoker:  no  Health Maintenance: Pap:   08/14/14, Negative with neg HR HPV History of abnormal Pap:  no TDaP:  2014 Gardasil:  1 injection only 07/2010. HIV and Hep C: 09/04/16 Negative Screening Labs: PCP   reports that she has never smoked. She has never used smokeless tobacco. She reports that she drinks about 2.0 standard drinks of alcohol per week. She reports that she does not use drugs.  Past Medical History:  Diagnosis Date  . Broken foot 08/2014   left  . Headache(784.0) 2016   migraine w/o aura  . Hyperlipidemia   . Low vitamin D level   . Migraines   . Plantar fasciitis of left foot   . Stress fracture of left foot 07/2015   confirmed with MRI    Past Surgical History:  Procedure Laterality Date  . INTRAUTERINE DEVICE (IUD) INSERTION     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/01/14  . INTRAUTERINE DEVICE INSERTION  11/2016   Kyleena     Current Outpatient Medications  Medication Sig Dispense Refill  . amitriptyline (ELAVIL) 50 MG tablet Take 1 tablet (50 mg total) by mouth at bedtime. 90 tablet 1  . Levonorgestrel (KYLEENA) 19.5 MG IUD by Intrauterine route.    . Multiple Vitamin (MULTIVITAMIN) tablet Take 1 tablet by mouth daily.    Marland Kitchen zolmitriptan (ZOMIG-ZMT) 2.5 MG disintegrating tablet Take 1 tablet (2.5 mg total) by mouth as needed for  migraine (may take repeat dose 2 hours later x1). 10 tablet 5   No current facility-administered medications for this visit.     Family History  Problem Relation Age of Onset  . Arthritis Mother   . Colitis Mother   . Lung cancer Maternal Grandmother        smoker  . Cancer Maternal Grandmother        kidney  . Leukemia Paternal Grandmother        unsure correct type of cancer  . Heart failure Paternal Grandmother   . Alzheimer's disease Maternal Grandfather   . Kidney failure Paternal Grandfather        ? dialysis  . Heart disease Maternal Aunt        MI 55-56    Review of Systems  All other systems reviewed and are negative.   Exam:   BP 128/80 (BP Location: Right Arm, Patient Position: Sitting, Cuff Size: Large)   Pulse 92   Resp 16   Ht 5\' 6"  (1.676 m)   Wt 247 lb (112 kg)   LMP 08/05/2017   BMI 39.87 kg/m     General appearance: alert, cooperative and appears stated age Head: Normocephalic, without obvious abnormality, atraumatic Neck: no adenopathy, supple, symmetrical, trachea midline and thyroid normal to  inspection and palpation Lungs: clear to auscultation bilaterally Breasts: normal appearance, no masses or tenderness, No nipple retraction or dimpling, No nipple discharge or bleeding, No axillary or supraclavicular adenopathy Heart: regular rate and rhythm Abdomen: soft, non-tender; no masses, no organomegaly Extremities: extremities normal, atraumatic, no cyanosis or edema Skin: Skin color, texture, turgor normal. No rashes or lesions Lymph nodes: Cervical, supraclavicular, and axillary nodes normal. No abnormal inguinal nodes palpated Neurologic: Grossly normal  Pelvic: External genitalia:  no lesions              Urethra:  normal appearing urethra with no masses, tenderness or lesions              Bartholins and Skenes: normal                 Vagina: normal appearing vagina with normal color and discharge, no lesions              Cervix: no lesions.   IUD strings noted.               Pap taken: Yes.   Bimanual Exam:  Uterus:  normal size, contour, position, consistency, mobility, non-tender              Adnexa: no mass, fullness, tenderness    Chaperone was present for exam.  Assessment:   Well woman visit with normal exam. Kyleena IUD.  Migraine HA without aura. Low vit D. Dizziness.  Missed menses.   Plan: Mammogram screening. Recommended self breast awareness. Pap and HR HPV as above. Guidelines for Calcium, Vitamin D, regular exercise program including cardiovascular and weight bearing exercise. UPT today.  I discussed Gardasil vaccine.  She will consider.  Declines STD testing.  Routine labs with PCP.  Follow up annually and prn.   After visit summary provided.

## 2017-09-17 NOTE — Addendum Note (Signed)
Addended by: Blima LedgerMYERS, Kanaan Kagawa N on: 09/17/2017 03:45 PM   Modules accepted: Orders

## 2017-09-21 LAB — CYTOLOGY - PAP
Diagnosis: NEGATIVE
HPV: NOT DETECTED

## 2017-10-01 ENCOUNTER — Encounter: Payer: Self-pay | Admitting: Obstetrics and Gynecology

## 2017-10-01 ENCOUNTER — Ambulatory Visit: Payer: BLUE CROSS/BLUE SHIELD | Admitting: Obstetrics and Gynecology

## 2017-10-01 VITALS — BP 128/80 | HR 100 | Temp 98.4°F | Resp 16 | Ht 65.5 in | Wt 247.0 lb

## 2017-10-01 DIAGNOSIS — Z7189 Other specified counseling: Secondary | ICD-10-CM | POA: Diagnosis not present

## 2017-10-01 DIAGNOSIS — R3915 Urgency of urination: Secondary | ICD-10-CM | POA: Diagnosis not present

## 2017-10-01 DIAGNOSIS — N760 Acute vaginitis: Secondary | ICD-10-CM

## 2017-10-01 DIAGNOSIS — Z7185 Encounter for immunization safety counseling: Secondary | ICD-10-CM

## 2017-10-01 DIAGNOSIS — Z23 Encounter for immunization: Secondary | ICD-10-CM | POA: Diagnosis not present

## 2017-10-01 LAB — POCT URINALYSIS DIPSTICK
BILIRUBIN UA: NEGATIVE
Glucose, UA: NEGATIVE
KETONES UA: NEGATIVE
NITRITE UA: NEGATIVE
PROTEIN UA: NEGATIVE
UROBILINOGEN UA: 0.2 U/dL
pH, UA: 6.5 (ref 5.0–8.0)

## 2017-10-01 MED ORDER — SULFAMETHOXAZOLE-TRIMETHOPRIM 800-160 MG PO TABS: 1.0000 | ORAL_TABLET | Freq: Two times a day (BID) | ORAL | 0 refills | Status: DC

## 2017-10-01 NOTE — Progress Notes (Signed)
GYNECOLOGY  VISIT   HPI: 32 y.o.   Single  Caucasian  female   G0P0000 with Patient's last menstrual period was 08/03/2017 (approximate).   here for UTI, vaginal itching, urinary urgency. Pain for 2 days.  Having pain that is a stabbing pain and burning with urination. Having left sided cramping.  Temp at home 97.2.  No nausea or vomiting.   Some interval vaginal itching.  No discharge.  No OTC meds other than Tylenol and Motrin.   Last UTI a couple of years ago.    Wants Gardasil vaccine serioe.   Urine dip - mod leukocytes, trace blood UPT - negative.   GYNECOLOGIC HISTORY: Patient's last menstrual period was 08/03/2017 (approximate). Contraception:  Rutha Bouchard IUD inserted 01/07/17 Menopausal hormone therapy:  none Last mammogram:  n/a Last pap smear:   09/17/17 Neg:Neg HR HPV        OB History    Gravida  0   Para  0   Term  0   Preterm  0   AB  0   Living  0     SAB  0   TAB  0   Ectopic  0   Multiple  0   Live Births  0              Patient Active Problem List   Diagnosis Date Noted  . Hyperlipidemia   . Encounter for long-term current use of medication 10/23/2015  . BMI 36.0-36.9,adult 10/23/2015  . Vitamin D deficiency 02/12/2015  . Migraine without aura     Past Medical History:  Diagnosis Date  . Broken foot 08/2014   left  . Headache(784.0) 2016   migraine w/o aura  . Hyperlipidemia   . Low vitamin D level   . Migraines   . Plantar fasciitis of left foot   . Stress fracture of left foot 07/2015   confirmed with MRI    Past Surgical History:  Procedure Laterality Date  . INTRAUTERINE DEVICE (IUD) INSERTION     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/01/14  . INTRAUTERINE DEVICE INSERTION  11/2016   Kyleena     Current Outpatient Medications  Medication Sig Dispense Refill  . amitriptyline (ELAVIL) 50 MG tablet Take 1 tablet (50 mg total) by mouth at bedtime. 90 tablet 1  . Levonorgestrel (KYLEENA) 19.5 MG IUD by Intrauterine route.    .  Multiple Vitamin (MULTIVITAMIN) tablet Take 1 tablet by mouth daily.    Marland Kitchen zolmitriptan (ZOMIG-ZMT) 2.5 MG disintegrating tablet Take 1 tablet (2.5 mg total) by mouth as needed for migraine (may take repeat dose 2 hours later x1). 10 tablet 5   No current facility-administered medications for this visit.      ALLERGIES: Patient has no known allergies.  Family History  Problem Relation Age of Onset  . Arthritis Mother   . Colitis Mother   . Lung cancer Maternal Grandmother        smoker  . Cancer Maternal Grandmother        kidney  . Leukemia Paternal Grandmother        unsure correct type of cancer  . Heart failure Paternal Grandmother   . Alzheimer's disease Maternal Grandfather   . Kidney failure Paternal Grandfather        ? dialysis  . Heart disease Maternal Aunt        MI 50-56    Social History   Socioeconomic History  . Marital status: Single    Spouse name: Not on  file  . Number of children: 0  . Years of education: Not on file  . Highest education level: Not on file  Occupational History  . Occupation: mortgage Sales executive  . Financial resource strain: Not on file  . Food insecurity:    Worry: Not on file    Inability: Not on file  . Transportation needs:    Medical: Not on file    Non-medical: Not on file  Tobacco Use  . Smoking status: Never Smoker  . Smokeless tobacco: Never Used  Substance and Sexual Activity  . Alcohol use: Yes    Alcohol/week: 2.0 standard drinks    Types: 2 Standard drinks or equivalent per week  . Drug use: No  . Sexual activity: Yes    Partners: Male    Birth control/protection: IUD    Comment: skyla--inserted 01-01-14  Lifestyle  . Physical activity:    Days per week: Not on file    Minutes per session: Not on file  . Stress: Not on file  Relationships  . Social connections:    Talks on phone: Not on file    Gets together: Not on file    Attends religious service: Not on file    Active member of club or  organization: Not on file    Attends meetings of clubs or organizations: Not on file    Relationship status: Not on file  . Intimate partner violence:    Fear of current or ex partner: Not on file    Emotionally abused: Not on file    Physically abused: Not on file    Forced sexual activity: Not on file  Other Topics Concern  . Not on file  Social History Narrative   Single- in a relationship. No children. 3 cats.    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   Hobbies: travel, reading, hiking   Feels safe in relationships    Review of Systems  Constitutional: Negative.   HENT: Negative.   Eyes: Negative.   Respiratory: Negative.   Cardiovascular: Negative.   Gastrointestinal: Negative.   Endocrine: Negative.   Genitourinary: Positive for dysuria.       Vaginal itching Urgency to urinate  Musculoskeletal: Negative.   Skin: Negative.   Allergic/Immunologic: Negative.   Neurological: Negative.   Hematological: Negative.   Psychiatric/Behavioral: Negative.   All other systems reviewed and are negative.   PHYSICAL EXAMINATION:    BP 128/80   Pulse 100   Temp 98.4 F (36.9 C) (Oral)   Resp 16   Ht 5' 5.5" (1.664 m)   Wt 247 lb (112 kg)   LMP 08/03/2017 (Approximate)   BMI 40.48 kg/m     General appearance: alert, cooperative and appears stated age   Pelvic: External genitalia:  no lesions              Urethra:  normal appearing urethra with no masses, tenderness or lesions              Bartholins and Skenes: normal                 Vagina: normal appearing vagina with normal color and discharge, no lesions              Cervix: no lesions.  IUD strings noted.  No CMT.  Bimanual Exam:  Uterus:  normal size, contour, position, consistency, mobility, non-tender              Adnexa: no mass, fullness, tenderness           Chaperone was present for exam.  ASSESSMENT  UTI.   Desire for Gardasil vaccine.   PLAN  Urine micro and culture.  Affirm.  Bactrim DS po bids x 3 days.  AZO tid prn.  Start Gardasil.   An After Visit Summary was printed and given to the patient.  __15___ minutes face to face time of which over 50% was spent in counseling.

## 2017-10-01 NOTE — Patient Instructions (Signed)

## 2017-10-02 LAB — URINALYSIS, MICROSCOPIC ONLY
Casts: NONE SEEN /lpf
WBC, UA: >30 /hpf — AB (ref 0–5)

## 2017-10-02 LAB — VAGINITIS/VAGINOSIS, DNA PROBE
CANDIDA SPECIES: NEGATIVE
GARDNERELLA VAGINALIS: NEGATIVE
Trichomonas vaginosis: NEGATIVE

## 2017-10-03 LAB — URINE CULTURE

## 2017-10-28 ENCOUNTER — Ambulatory Visit (INDEPENDENT_AMBULATORY_CARE_PROVIDER_SITE_OTHER): Payer: BLUE CROSS/BLUE SHIELD | Admitting: Family Medicine

## 2017-10-28 ENCOUNTER — Encounter: Payer: Self-pay | Admitting: Family Medicine

## 2017-10-28 VITALS — BP 118/81 | HR 85 | Temp 98.7°F | Resp 20 | Ht 66.0 in | Wt 249.0 lb

## 2017-10-28 DIAGNOSIS — G43009 Migraine without aura, not intractable, without status migrainosus: Secondary | ICD-10-CM

## 2017-10-28 DIAGNOSIS — Z79899 Other long term (current) drug therapy: Secondary | ICD-10-CM

## 2017-10-28 DIAGNOSIS — E785 Hyperlipidemia, unspecified: Secondary | ICD-10-CM

## 2017-10-28 DIAGNOSIS — Z0000 Encounter for general adult medical examination without abnormal findings: Secondary | ICD-10-CM

## 2017-10-28 DIAGNOSIS — E559 Vitamin D deficiency, unspecified: Secondary | ICD-10-CM

## 2017-10-28 DIAGNOSIS — E669 Obesity, unspecified: Secondary | ICD-10-CM

## 2017-10-28 DIAGNOSIS — Z130 Encounter for screening for diseases of the blood and blood-forming organs and certain disorders involving the immune mechanism: Secondary | ICD-10-CM

## 2017-10-28 DIAGNOSIS — Z131 Encounter for screening for diabetes mellitus: Secondary | ICD-10-CM | POA: Diagnosis not present

## 2017-10-28 LAB — CBC WITH DIFFERENTIAL/PLATELET
Basophils Absolute: 0.1 10*3/uL (ref 0.0–0.1)
Basophils Relative: 1.1 % (ref 0.0–3.0)
EOS ABS: 0.1 10*3/uL (ref 0.0–0.7)
Eosinophils Relative: 1 % (ref 0.0–5.0)
HCT: 44.9 % (ref 36.0–46.0)
HEMOGLOBIN: 15 g/dL (ref 12.0–15.0)
Lymphocytes Relative: 33.6 % (ref 12.0–46.0)
Lymphs Abs: 3.1 10*3/uL (ref 0.7–4.0)
MCHC: 33.4 g/dL (ref 30.0–36.0)
MCV: 95.1 fl (ref 78.0–100.0)
MONO ABS: 0.7 10*3/uL (ref 0.1–1.0)
Monocytes Relative: 7.7 % (ref 3.0–12.0)
Neutro Abs: 5.2 10*3/uL (ref 1.4–7.7)
Neutrophils Relative %: 56.6 % (ref 43.0–77.0)
Platelets: 239 10*3/uL (ref 150.0–400.0)
RBC: 4.72 Mil/uL (ref 3.87–5.11)
RDW: 13.7 % (ref 11.5–15.5)
WBC: 9.1 10*3/uL (ref 4.0–10.5)

## 2017-10-28 LAB — COMPREHENSIVE METABOLIC PANEL
ALK PHOS: 78 U/L (ref 39–117)
ALT: 22 U/L (ref 0–35)
AST: 19 U/L (ref 0–37)
Albumin: 4.2 g/dL (ref 3.5–5.2)
BUN: 12 mg/dL (ref 6–23)
CO2: 28 mEq/L (ref 19–32)
CREATININE: 0.75 mg/dL (ref 0.40–1.20)
Calcium: 9.2 mg/dL (ref 8.4–10.5)
Chloride: 106 mEq/L (ref 96–112)
GFR: 94.59 mL/min (ref >60.00)
GLUCOSE: 90 mg/dL (ref 70–99)
POTASSIUM: 4.7 meq/L (ref 3.5–5.1)
SODIUM: 138 meq/L (ref 135–145)
Total Bilirubin: 0.6 mg/dL (ref 0.2–1.2)
Total Protein: 6.7 g/dL (ref 6.0–8.3)

## 2017-10-28 LAB — LIPID PANEL
Cholesterol: 143 mg/dL (ref 0–200)
HDL: 48.6 mg/dL (ref >39.00)
LDL CALC: 75 mg/dL (ref 0–99)
NonHDL: 94.09
Total CHOL/HDL Ratio: 3
Triglycerides: 97 mg/dL (ref 0.0–149.0)
VLDL: 19.4 mg/dL (ref 0.0–40.0)

## 2017-10-28 LAB — TSH: TSH: 2.71 u[IU]/mL (ref 0.35–4.50)

## 2017-10-28 LAB — HEMOGLOBIN A1C: HEMOGLOBIN A1C: 4.8 % (ref 4.6–6.5)

## 2017-10-28 LAB — VITAMIN D 25 HYDROXY (VIT D DEFICIENCY, FRACTURES): VITD: 22.79 ng/mL — ABNORMAL LOW (ref 30.00–100.00)

## 2017-10-28 MED ORDER — ZOLMITRIPTAN 2.5 MG PO TBDP: 2.5000 mg | ORAL_TABLET | ORAL | 11 refills | Status: DC | PRN

## 2017-10-28 MED ORDER — AMITRIPTYLINE HCL 50 MG PO TABS: 50.0000 mg | ORAL_TABLET | Freq: Every day | ORAL | 3 refills | Status: DC

## 2017-10-28 NOTE — Patient Instructions (Signed)

## 2017-10-28 NOTE — Progress Notes (Signed)
Patient ID: Wendy Molina, female  DOB: 1985/05/20, 32 y.o.   MRN: [id number] Patient Care Team    Relationship Specialty Notifications Start End  Ma Hillock, DO PCP - General Family Medicine  02/11/15   Nunzio Cobbs, MD Consulting Physician Obstetrics and Gynecology  10/26/16     Chief Complaint  Patient presents with  . Annual Exam    Subjective:  Wendy Molina is a 32 y.o.  Female  present for CPE. All past medical history, surgical history, allergies, family history, immunizations, medications and social history were updated in the electronic medical record today. All recent labs, ED visits and hospitalizations within the last year were reviewed.  Migraine Patient reports comp;iance with her amitriptyline 50 mg before bed. She needs refills on meds today and has not needed any break through meds but once.   Health maintenance: upated 11/01/17 Colonoscopy: No Fhx, screen at 20 Mammogram: No Fhx screen at 39 Cervical cancer screening: last pap: 08/2017,  Dr. Quincy Simmonds, Normal  Immunizations: tdap 2014, Influenza declined flu (encouraged yearly) Infectious disease screening: HIV 04/06/2014 DEXA:N/A Assistive device: none Oxygen PVX:YIAX Patient has a Dental home. Hospitalizations/ED visits: reviewed   Depression screen [facility] 2/9 10/28/2017 10/26/2016 10/23/2015  Decreased Interest 0 0 0  Down, Depressed, Hopeless 0 0 0  PHQ - 2 Score 0 0 0   No flowsheet data found.   Current Exercise Habits: The patient does not participate in regular exercise at present Exercise limited by: None identified   Immunization History  Administered Date(s) Administered  . HPV 9-valent 10/01/2017  . Tdap 06/22/2012     Past Medical History:  Diagnosis Date  . Broken foot 08/2014   left  . Headache(784.0) 2016   migraine w/o aura  . Hyperlipidemia   . Low vitamin D level   . Migraines   . Plantar fasciitis of left foot   . Stress fracture of left foot  07/2015   confirmed with MRI   No Known Allergies Past Surgical History:  Procedure Laterality Date  . INTRAUTERINE DEVICE (IUD) INSERTION     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/01/14  . INTRAUTERINE DEVICE INSERTION  11/2016   Kyleena    Family History  Problem Relation Age of Onset  . Arthritis Mother   . Colitis Mother   . Lung cancer Maternal Grandmother        smoker  . Cancer Maternal Grandmother        kidney  . Leukemia Paternal Grandmother        unsure correct type of cancer  . Heart failure Paternal Grandmother   . Alzheimer's disease Maternal Grandfather   . Kidney failure Paternal Grandfather        ? dialysis  . Heart disease Maternal Aunt        MI 40-56   Social History   Socioeconomic History  . Marital status: Single    Spouse name: Not on file  . Number of children: 0  . Years of education: Not on file  . Highest education level: Not on file  Occupational History  . Occupation: mortgage Science writer  . Financial resource strain: Not on file  . Food insecurity:    Worry: Not on file    Inability: Not on file  . Transportation needs:    Medical: Not on file    Non-medical: Not on file  Tobacco Use  . Smoking status: Never Smoker  . Smokeless tobacco: Never  Used  Substance and Sexual Activity  . Alcohol use: Yes    Alcohol/week: 2.0 standard drinks    Types: 2 Standard drinks or equivalent per week  . Drug use: No  . Sexual activity: Yes    Partners: Male    Birth control/protection: IUD    Comment: skyla--inserted 01-01-14  Lifestyle  . Physical activity:    Days per week: Not on file    Minutes per session: Not on file  . Stress: Not on file  Relationships  . Social connections:    Talks on phone: Not on file    Gets together: Not on file    Attends religious service: Not on file    Active member of club or organization: Not on file    Attends meetings of clubs or organizations: Not on file    Relationship status: Not on file  .  Intimate partner violence:    Fear of current or ex partner: Not on file    Emotionally abused: Not on file    Physically abused: Not on file    Forced sexual activity: Not on file  Other Topics Concern  . Not on file  Social History Narrative   Single- in a relationship. No children. 3 cats.    Works for [employer]   Take a daily vitamin   Wears her seatbelt, smoke detector in the home.   Hobbies: travel, reading, hiking   Feels safe in relationships   Allergies as of 10/28/2017   No Known Allergies     Medication List        Accurate as of 10/28/17 11:59 PM. Always use your most recent med list.          amitriptyline 50 MG tablet Commonly known as:  ELAVIL Take 1 tablet (50 mg total) by mouth at bedtime.   KYLEENA 19.5 MG Iud Generic drug:  Levonorgestrel by Intrauterine route.   multivitamin tablet Take 1 tablet by mouth daily.   zolmitriptan 2.5 MG disintegrating tablet Commonly known as:  ZOMIG-ZMT Take 1 tablet (2.5 mg total) by mouth as needed for migraine (may take repeat dose 2 hours later x1).       All past medical history, surgical history, allergies, family history, immunizations andmedications were updated in the EMR today and reviewed under the history and medication portions of their EMR.     Recent Results (from the past 2160 hour(s))  Cytology - PAP     Status: None   Collection Time: 09/17/17 12:00 AM  Result Value Ref Range   Adequacy      Satisfactory for evaluation  endocervical/transformation zone component PRESENT.   Diagnosis      NEGATIVE FOR INTRAEPITHELIAL LESIONS OR MALIGNANCY.   HPV NOT DETECTED     Comment: Normal Reference Range - NOT Detected   Material Submitted CervicoVaginal Pap [ThinPrep Imaged]   POCT urine pregnancy     Status: None   Collection Time: 09/17/17  3:44 PM  Result Value Ref Range   Preg Test, Ur Negative Negative  POCT urinalysis dipstick     Status: Abnormal   Collection Time:  10/01/17 12:54 PM  Result Value Ref Range   Color, UA yellow    Clarity, UA clear    Glucose, UA Negative Negative   Bilirubin, UA negative    Ketones, UA negative    Spec Grav, UA     Blood, UA trace    pH, UA 6.5 5.0 - 8.0  Protein, UA Negative Negative   Urobilinogen, UA 0.2 0.2 or 1.0 E.U./dL   Nitrite, UA negative    Leukocytes, UA Moderate (2+) (A) Negative   Appearance     Odor    Urine Microscopic     Status: Abnormal   Collection Time: 10/01/17  1:25 PM  Result Value Ref Range   WBC, UA >30 (A) 0 - 5 /hpf   RBC, UA 0-2 0 - 2 /hpf   Epithelial Cells (non renal) 0-10 0 - 10 /hpf   Casts None seen None seen /lpf   Mucus, UA Present Not Estab.   Bacteria, UA Few None seen/Few  Urine Culture     Status: Abnormal   Collection Time: 10/01/17  1:27 PM  Result Value Ref Range   Urine Culture, Routine Final report (A)    Organism ID, Bacteria Escherichia coli (A)     Comment: Greater than 100,000 colony forming units per mL Cefazolin <=4 ug/mL Cefazolin with an MIC <=16 predicts susceptibility to the oral agents cefaclor, cefdinir, cefpodoxime, cefprozil, cefuroxime, cephalexin, and loracarbef when used for therapy of uncomplicated urinary tract infections due to E. coli, Klebsiella pneumoniae, and Proteus mirabilis.    ORGANISM ID, BACTERIA Comment     Comment: Mixed urogenital flora 25,000-50,000 colony forming units per mL    Antimicrobial Susceptibility Comment     Comment:       ** S = Susceptible; I = Intermediate; R = Resistant **                    P = Positive; N = Negative             MICS are expressed in micrograms per mL    Antibiotic                 RSLT#1    RSLT#2    RSLT#3    RSLT#4 Amoxicillin/Clavulanic Acid    S<=2 Ampicillin                     S<=2 Cefepime                       S<=0.12 Ceftriaxone                    S<=0.25 Cefuroxime                     S =2 Ciprofloxacin                  S<=0.25 Ertapenem                       S<=0.12 Gentamicin                     S<=1 Imipenem                       S<=0.25 Levofloxacin                   S<=0.12 Meropenem                      S<=0.25 Nitrofurantoin                 S<=16 Piperacillin/Tazobactam        S<=4 Tetracycline  S<=1 Tobramycin                     S<=1 Trimethoprim/Sulfa             S<=20   Vaginitis/Vaginosis, DNA Probe     Status: None   Collection Time: 10/01/17  1:29 PM  Result Value Ref Range   Candida Species Negative Negative   Gardnerella vaginalis Negative Negative   Trichomonas vaginosis Negative Negative  CBC w/Diff     Status: None   Collection Time: 10/28/17  8:46 AM  Result Value Ref Range   WBC 9.1 4.0 - 10.5 K/uL   RBC 4.72 3.87 - 5.11 Mil/uL   Hemoglobin 15.0 12.0 - 15.0 g/dL   HCT 44.9 36.0 - 46.0 %   MCV 95.1 78.0 - 100.0 fl   MCHC 33.4 30.0 - 36.0 g/dL   RDW 13.7 11.5 - 15.5 %   Platelets 239.0 150.0 - 400.0 K/uL   Neutrophils Relative % 56.6 43.0 - 77.0 %   Lymphocytes Relative 33.6 12.0 - 46.0 %   Monocytes Relative 7.7 3.0 - 12.0 %   Eosinophils Relative 1.0 0.0 - 5.0 %   Basophils Relative 1.1 0.0 - 3.0 %   Neutro Abs 5.2 1.4 - 7.7 K/uL   Lymphs Abs 3.1 0.7 - 4.0 K/uL   Monocytes Absolute 0.7 0.1 - 1.0 K/uL   Eosinophils Absolute 0.1 0.0 - 0.7 K/uL   Basophils Absolute 0.1 0.0 - 0.1 K/uL  Comp Met (CMET)     Status: None   Collection Time: 10/28/17  8:46 AM  Result Value Ref Range   Sodium 138 135 - 145 mEq/L   Potassium 4.7 3.5 - 5.1 mEq/L   Chloride 106 96 - 112 mEq/L   CO2 28 19 - 32 mEq/L   Glucose, Bld 90 70 - 99 mg/dL   BUN 12 6 - 23 mg/dL   Creatinine, Ser 0.75 0.40 - 1.20 mg/dL   Total Bilirubin 0.6 0.2 - 1.2 mg/dL   Alkaline Phosphatase 78 39 - 117 U/L   AST 19 0 - 37 U/L   ALT 22 0 - 35 U/L   Total Protein 6.7 6.0 - 8.3 g/dL   Albumin 4.2 3.5 - 5.2 g/dL   Calcium 9.2 8.4 - 10.5 mg/dL   GFR 94.59 >60.00 mL/min  Lipid panel     Status: None   Collection Time: 10/28/17  8:46 AM   Result Value Ref Range   Cholesterol 143 0 - 200 mg/dL    Comment: ATP III Classification       Desirable:  < 200 mg/dL               Borderline High:  200 - 239 mg/dL          High:  > = 240 mg/dL   Triglycerides 97.0 0.0 - 149.0 mg/dL    Comment: Normal:  <150 mg/dLBorderline High:  150 - 199 mg/dL   HDL 48.60 >39.00 mg/dL   VLDL 19.4 0.0 - 40.0 mg/dL   LDL Cholesterol 75 0 - 99 mg/dL   Total CHOL/HDL Ratio 3     Comment:                Men          Women1/2 Average Risk     3.4          3.3Average Risk          5.0  4.42X Average Risk          9.6          7.13X Average Risk          15.0          11.0                       NonHDL 94.09     Comment: NOTE:  Non-HDL goal should be 30 mg/dL higher than patient's LDL goal (i.e. LDL goal of < 70 mg/dL, would have non-HDL goal of < 100 mg/dL)  HgB A1c     Status: None   Collection Time: 10/28/17  8:46 AM  Result Value Ref Range   Hgb A1c MFr Bld 4.8 4.6 - 6.5 %    Comment: Glycemic Control Guidelines for People with Diabetes:Non Diabetic:  <6%Goal of Therapy: <7%Additional Action Suggested:  >8%   TSH     Status: None   Collection Time: 10/28/17  8:46 AM  Result Value Ref Range   TSH 2.71 0.35 - 4.50 uIU/mL  Vitamin D (25 hydroxy)     Status: Abnormal   Collection Time: 10/28/17  8:46 AM  Result Value Ref Range   VITD 22.79 (L) 30.00 - 100.00 ng/mL    No results found.   ROS: 14 pt review of systems performed and negative (unless mentioned in an HPI)  Objective: BP 118/81 (BP Location: Right Arm, Patient Position: Sitting, Cuff Size: Large)   Pulse 85   Temp 98.7 F (37.1 C)   Resp 20   Ht _0  (1.676 m)   Wt 249 lb (112.9 kg)   SpO2 97%   BMI 40.19 kg/m  Gen: Afebrile. No acute distress. Nontoxic in appearance, well-developed, well-nourished, obese, Caucasian female. HENT: AT. [place]. Bilateral TM visualized and normal in appearance, normal external auditory canal. MMM, no oral lesions, adequate dentition. Bilateral  nares within normal limits. Throat without erythema, ulcerations or exudates.  No cough on exam, no hoarseness on exam. Eyes:Pupils Equal Round Reactive to light, Extraocular movements intact,  Conjunctiva without redness, discharge or icterus. Neck/lymp/endocrine: Supple, no lymphadenopathy, no thyromegaly CV: RRR no murmur, no edema, +2/4 P posterior tibialis pulses.  No carotid bruits. No JVD. Chest: CTAB, no wheeze, rhonchi or crackles.  Normal respiratory effort.  Good air movement. Abd: Soft.  Obese. NTND. BS +.  No masses palpated. No hepatosplenomegaly. No rebound tenderness or guarding. Skin: no rashes, purpura or petechiae. Warm and well-perfused. Skin intact. Neuro/Msk:  Normal gait. PERLA. EOMi. Alert. Oriented x3.  Cranial nerves II through XII intact. Muscle strength 5/5 upper/lower extremity. DTRs equal bilaterally. Psych: Normal affect, dress and demeanor. Normal speech. Normal thought content and judgment.   No exam data present  Assessment/plan: Wendy Molina is a 32 y.o. female present for CPE. Hyperlipidemia, unspecified hyperlipidemia type/encounter for long-term med use/obesity -Diet and exercise regimen encouraged. - Comp Met (CMET) - Lipid panel - TSH Vitamin D deficiency - Vitamin D (25 hydroxy) Screening for iron deficiency anemia - CBC w/Diff Screening for diabetes mellitus - HgB A1c Migraine without aura and without status migrainosus, not intractable Stable, continue amitriptyline 50 mg nightly. - amitriptyline (ELAVIL) 50 MG tablet; Take 1 tablet (50 mg total) by mouth at bedtime.  Dispense: 90 tablet; Refill: 3 -Follow up 6 months Encounter for preventive health examination Patient was encouraged to exercise greater than 150 minutes a week. Patient was encouraged to choose a diet filled with fresh fruits  and vegetables, and lean meats. AVS provided to patient today for education/recommendation on gender specific health and safety  maintenance. Colonoscopy: No Fhx, screen at 49 Mammogram: No Fhx screen at 39 Cervical cancer screening: last pap: 08/2017,  Dr. Quincy Simmonds, Normal  Immunizations: tdap 2014, Influenza declined flu (encouraged yearly) Infectious disease screening: HIV 04/06/2014 DEXA:N/A   Return in about 1 year (around 10/29/2018) for CPE.  Follow-up 6 months on migraines.  Electronically signed by: Howard Pouch, DO [place]

## 2017-11-01 ENCOUNTER — Encounter: Payer: Self-pay | Admitting: Family Medicine

## 2017-11-26 ENCOUNTER — Ambulatory Visit (INDEPENDENT_AMBULATORY_CARE_PROVIDER_SITE_OTHER): Payer: BLUE CROSS/BLUE SHIELD | Admitting: Obstetrics and Gynecology

## 2017-11-26 VITALS — BP 110/64 | HR 76 | Resp 14 | Ht 65.0 in | Wt 153.0 lb

## 2017-11-26 DIAGNOSIS — Z23 Encounter for immunization: Secondary | ICD-10-CM

## 2017-11-26 NOTE — Progress Notes (Signed)
Patient in today for 2nd Gardasil injection.   Contraception: IUD LMP: IUD Last AEX: 09/17/17 with BS  Injection given in Right deltoid.  Patient tolerated shot well.   Patient informed next injection due in about 4  Months. After March 27, 2018  Advised patient, if not on birth control, to return for next injection with cycle.   Routed to provider for final review.  Encounter closed.

## 2017-11-29 ENCOUNTER — Encounter: Payer: Self-pay | Admitting: Obstetrics and Gynecology

## 2018-02-15 ENCOUNTER — Ambulatory Visit: Payer: BLUE CROSS/BLUE SHIELD | Admitting: Certified Nurse Midwife

## 2018-02-15 ENCOUNTER — Encounter: Payer: Self-pay | Admitting: Certified Nurse Midwife

## 2018-02-15 ENCOUNTER — Other Ambulatory Visit: Payer: Self-pay

## 2018-02-15 VITALS — BP 120/70 | HR 68 | Temp 97.9°F | Resp 16 | Wt 251.0 lb

## 2018-02-15 DIAGNOSIS — N390 Urinary tract infection, site not specified: Secondary | ICD-10-CM | POA: Diagnosis not present

## 2018-02-15 LAB — POCT URINALYSIS DIPSTICK
Bilirubin, UA: NEGATIVE
Blood, UA: NEGATIVE
Glucose, UA: NEGATIVE
Ketones, UA: NEGATIVE
NITRITE UA: NEGATIVE
PROTEIN UA: NEGATIVE
Urobilinogen, UA: NEGATIVE E.U./dL — AB
pH, UA: 5 (ref 5.0–8.0)

## 2018-02-15 MED ORDER — NITROFURANTOIN MONOHYD MACRO 100 MG PO CAPS: 100.0000 mg | ORAL_CAPSULE | Freq: Two times a day (BID) | ORAL | 0 refills | Status: DC

## 2018-02-15 NOTE — Patient Instructions (Signed)
Urinary Tract Infection, Adult A urinary tract infection (UTI) is an infection of any part of the urinary tract. The urinary tract includes:  The kidneys.  The ureters.  The bladder.  The urethra. These organs make, store, and get rid of pee (urine) in the body. What are the causes? This is caused by germs (bacteria) in your genital area. These germs grow and cause swelling (inflammation) of your urinary tract. What increases the risk? You are more likely to develop this condition if:  You have a small, thin tube (catheter) to drain pee.  You cannot control when you pee or poop (incontinence).  You are female, and: ? You use these methods to prevent pregnancy: ? A medicine that kills sperm (spermicide). ? A device that blocks sperm (diaphragm). ? You have low levels of a female hormone (estrogen). ? You are pregnant.  You have genes that add to your risk.  You are sexually active.  You take antibiotic medicines.  You have trouble peeing because of: ? A prostate that is bigger than normal, if you are female. ? A blockage in the part of your body that drains pee from the bladder (urethra). ? A kidney stone. ? A nerve condition that affects your bladder (neurogenic bladder). ? Not getting enough to drink. ? Not peeing often enough.  You have other conditions, such as: ? Diabetes. ? A weak disease-fighting system (immune system). ? Sickle cell disease. ? Gout. ? Injury of the spine. What are the signs or symptoms? Symptoms of this condition include:  Needing to pee right away (urgently).  Peeing often.  Peeing small amounts often.  Pain or burning when peeing.  Blood in the pee.  Pee that smells bad or not like normal.  Trouble peeing.  Pee that is cloudy.  Fluid coming from the vagina, if you are female.  Pain in the belly or lower back. Other symptoms include:  Throwing up (vomiting).  No urge to eat.  Feeling mixed up (confused).  Being tired  and grouchy (irritable).  A fever.  Watery poop (diarrhea). How is this treated? This condition may be treated with:  Antibiotic medicine.  Other medicines.  Drinking enough water. Follow these instructions at home:  Medicines  Take over-the-counter and prescription medicines only as told by your doctor.  If you were prescribed an antibiotic medicine, take it as told by your doctor. Do not stop taking it even if you start to feel better. General instructions  Make sure you: ? Pee until your bladder is empty. ? Do not hold pee for a long time. ? Empty your bladder after sex. ? Wipe from front to back after pooping if you are a female. Use each tissue one time when you wipe.  Drink enough fluid to keep your pee pale yellow.  Keep all follow-up visits as told by your doctor. This is important. Contact a doctor if:  You do not get better after 1-2 days.  Your symptoms go away and then come back. Get help right away if:  You have very bad back pain.  You have very bad pain in your lower belly.  You have a fever.  You are sick to your stomach (nauseous).  You are throwing up. Summary  A urinary tract infection (UTI) is an infection of any part of the urinary tract.  This condition is caused by germs in your genital area.  There are many risk factors for a UTI. These include having a small, thin   tube to drain pee and not being able to control when you pee or poop.  Treatment includes antibiotic medicines for germs.  Drink enough fluid to keep your pee pale yellow. This information is not intended to replace advice given to you by your health care provider. Make sure you discuss any questions you have with your health care provider. Document Released: 07/01/2007 Document Revised: 07/22/2017 Document Reviewed: 07/22/2017 Elsevier Interactive Patient Education  2019 [employer] Inc[employer]  

## 2018-02-15 NOTE — Progress Notes (Signed)
33 y.o. Single Caucasian female [id number] here with complaint of UTI, with onset  on 4-5 days. Patient complaining of urinary frequency/urgency/ and pain with urination. Patient denies fever, chills, nausea. Some slight back pain. No new personal products. Patient feels not related to sexual activity. Denies any vaginal symptoms.  Patient feels she has not been hydrating well.   Contraception is [place] IUD..IUD working well.  Review of Systems  Constitutional: Negative.   HENT: Negative.   Eyes: Negative.   Respiratory: Negative.   Cardiovascular: Negative.   Gastrointestinal: Negative for abdominal pain, nausea and vomiting.  Genitourinary: Negative for dysuria, frequency, hematuria and urgency.  Musculoskeletal: Negative.   Skin: Negative.   Neurological: Negative.   Endo/Heme/Allergies: Negative.   Psychiatric/Behavioral: Negative.     O: Healthy female WDWN Affect: Normal, orientation x 3 Skin : warm and dry CVAT: slight positive on left only Abdomen: positive for suprapubic tenderness  Pelvic exam: External genital area: normal, no lesions Bladder,Urethra tender, Urethral meatus: tender, red Vagina: normal vaginal discharge, normal appearance  Wet prep not taken Cervix: normal, non tender, IUD string noted in cervix Uterus:normal,non tender Adnexa: normal non tender, no fullness or masses   A: UTI Normal pelvic exam poct urine-wbc 1+  P: Reviewed findings of UTI and need for treatment. KG:YJEHUDJS 10 mg bid x 7 see order for instructions HFW:YOVZC culture Reviewed warning signs and symptoms of UTI and need to advise if occurring. Encouraged to limit soda, tea, and coffee and be sure to increase water intake.   RV prn

## 2018-02-17 ENCOUNTER — Telehealth: Payer: Self-pay

## 2018-02-17 LAB — URINE CULTURE

## 2018-02-17 NOTE — Telephone Encounter (Signed)
Left message to call back  

## 2018-02-17 NOTE — Telephone Encounter (Signed)
-----   Message from Verner Chol, CNM sent at 02/17/2018 12:04 PM EST ----- Notify patient her urine culture showed E.Coli and she is on appropriate medication, complete medication Patient status

## 2018-02-17 NOTE — Telephone Encounter (Signed)
Patient notified of results. See lab 

## 2018-05-25 ENCOUNTER — Ambulatory Visit: Payer: BLUE CROSS/BLUE SHIELD

## 2018-05-30 ENCOUNTER — Ambulatory Visit: Payer: BLUE CROSS/BLUE SHIELD

## 2018-09-19 ENCOUNTER — Other Ambulatory Visit: Payer: Self-pay

## 2018-09-21 ENCOUNTER — Ambulatory Visit: Payer: BC Managed Care – PPO | Admitting: Obstetrics and Gynecology

## 2018-09-21 ENCOUNTER — Other Ambulatory Visit: Payer: Self-pay

## 2018-09-21 ENCOUNTER — Other Ambulatory Visit (HOSPITAL_COMMUNITY)
Admission: RE | Admit: 2018-09-21 | Discharge: 2018-09-21 | Disposition: A | Discharge status: Home / Self Care | Payer: BC Managed Care – PPO | Source: Ambulatory Visit | Attending: Obstetrics and Gynecology | Admitting: Obstetrics and Gynecology

## 2018-09-21 ENCOUNTER — Encounter: Payer: Self-pay | Admitting: Obstetrics and Gynecology

## 2018-09-21 VITALS — BP 110/78 | HR 100 | Temp 97.5°F | Resp 20 | Ht 66.0 in | Wt 255.0 lb

## 2018-09-21 DIAGNOSIS — Z23 Encounter for immunization: Secondary | ICD-10-CM

## 2018-09-21 DIAGNOSIS — Z113 Encounter for screening for infections with a predominantly sexual mode of transmission: Secondary | ICD-10-CM | POA: Insufficient documentation

## 2018-09-21 DIAGNOSIS — Z01419 Encounter for gynecological examination (general) (routine) without abnormal findings: Secondary | ICD-10-CM | POA: Diagnosis not present

## 2018-09-21 LAB — POCT URINE PREGNANCY: Preg Test, Ur: NEGATIVE

## 2018-09-21 NOTE — Progress Notes (Signed)
33 y.o. G0P0000 Single Caucasian female here for annual exam.    Menses more frequent, monthly, and last a day or two.  Some cramping and diarrhea with her cycle.   Wants STD screening.   Graduated in [employer].  Same partner for 4.5 years.   PCP:  Howard Pouch, DO   Patient's last menstrual period was 09/07/2018 (approximate).     Period Cycle (Days): 21 Period Duration (Days): 1-2 days Period Pattern: (!) Irregular Menstrual Flow: Light Menstrual Control: Panty liner Dysmenorrhea: (!) Mild Dysmenorrhea Symptoms: Cramping     Sexually active: Yes.    The current method of family planning is IUD--Kyleena 01-07-17.    Exercising: Yes.    The patient does not participate in regular exercise at present. Smoker:  no  Health Maintenance: Pap: 09-17-17 Neg:Neg HR HPV, 08-14-14 Neg:Neg HR HPV History of abnormal Pap:  no MMG:  n/a Colonoscopy:  n/a BMD:   n/a  Result  n/a TDaP: 06-22-12 Gardasil:   Completed 2 out of 3 injections HIV: 09-04-16 NR Hep C:09-04-16 Neg Screening Labs:  ----   reports that she has never smoked. She has never used smokeless tobacco. She reports current alcohol use of about 2.0 standard drinks of alcohol per week. She reports that she does not use drugs.  Past Medical History:  Diagnosis Date  . Broken foot 08/2014   left  . Headache(784.0) 2016   migraine w/o aura  . Hyperlipidemia   . Low vitamin D level   . Migraines   . Plantar fasciitis of left foot   . Stress fracture of left foot 07/2015   confirmed with MRI    Past Surgical History:  Procedure Laterality Date  . INTRAUTERINE DEVICE (IUD) INSERTION     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/01/14  . INTRAUTERINE DEVICE INSERTION  11/2016   Kyleena     Current Outpatient Medications  Medication Sig Dispense Refill  . amitriptyline (ELAVIL) 50 MG tablet Take 1 tablet (50 mg total) by mouth at bedtime. 90 tablet 3  . Levonorgestrel (KYLEENA) 19.5 MG IUD by Intrauterine route.    . Multiple  Vitamin (MULTIVITAMIN) tablet Take 1 tablet by mouth daily.    Marland Kitchen zolmitriptan (ZOMIG-ZMT) 2.5 MG disintegrating tablet Take 1 tablet (2.5 mg total) by mouth as needed for migraine (may take repeat dose 2 hours later x1). 10 tablet 11   No current facility-administered medications for this visit.     Family History  Problem Relation Age of Onset  . Arthritis Mother   . Colitis Mother   . Lung cancer Maternal Grandmother        smoker  . Cancer Maternal Grandmother        kidney  . Leukemia Paternal Grandmother        unsure correct type of cancer  . Heart failure Paternal Grandmother   . Alzheimer's disease Maternal Grandfather   . Kidney failure Paternal Grandfather        ? dialysis  . Heart disease Maternal Aunt        MI 55-56    Review of Systems  All other systems reviewed and are negative.   Exam:   BP 110/78 (Cuff Size: Large)   Pulse 100   Temp (!) 97.5 F (36.4 C) (Temporal)   Resp 20   Ht 5\' 6"  (1.676 m)   Wt 255 lb (115.7 kg)   LMP 09/07/2018 (Approximate)   BMI 41.16 kg/m     General appearance: alert, cooperative and appears  stated age Head: normocephalic, without obvious abnormality, atraumatic Neck: no adenopathy, supple, symmetrical, trachea midline and thyroid normal to inspection and palpation Lungs: clear to auscultation bilaterally Breasts: normal appearance, no masses or tenderness, No nipple retraction or dimpling, No nipple discharge or bleeding, No axillary adenopathy Heart: regular rate and rhythm Abdomen: soft, non-tender; no masses, no organomegaly Extremities: extremities normal, atraumatic, no cyanosis or edema Skin: skin color, texture, turgor normal. No rashes or lesions Lymph nodes: cervical, supraclavicular, and axillary nodes normal. Neurologic: grossly normal  Pelvic: External genitalia:  no lesions              No abnormal inguinal nodes palpated.              Urethra:  normal appearing urethra with no masses, tenderness or  lesions              Bartholins and Skenes: normal                 Vagina: normal appearing vagina with normal color and discharge, no lesions              Cervix: no lesions.  IUD strings noted.               Pap taken: No. Bimanual Exam:  Uterus:  normal size, contour, position, consistency, mobility, non-tender              Adnexa: no mass, fullness, tenderness           Chaperone was present for exam.  Assessment:   Well woman visit with normal exam. Kyleena IUD.  Migraine HA without aura. Low vit D. STD screening.   Plan: Mammogram screening discussed. Self breast awareness reviewed. Pap and HR HPV as above. Guidelines for Calcium, Vitamin D, regular exercise program including cardiovascular and weight bearing exercise. STD screening.  Routine labs with PCP.  Third Gardasil vaccine.  Follow up annually and prn.   After visit summary provided.

## 2018-09-21 NOTE — Patient Instructions (Signed)

## 2018-09-22 LAB — HEP, RPR, HIV PANEL
HIV Screen 4th Generation wRfx: NONREACTIVE
Hepatitis B Surface Ag: NEGATIVE
RPR Ser Ql: NONREACTIVE

## 2018-09-22 LAB — HEPATITIS C ANTIBODY: Hep C Virus Ab: 0.1 s/co ratio (ref 0.0–0.9)

## 2018-09-23 LAB — CERVICOVAGINAL ANCILLARY ONLY
Chlamydia: NEGATIVE
Neisseria Gonorrhea: NEGATIVE
Trichomonas: NEGATIVE

## 2018-10-31 ENCOUNTER — Telehealth: Payer: Self-pay | Admitting: Family Medicine

## 2018-10-31 ENCOUNTER — Other Ambulatory Visit: Payer: Self-pay

## 2018-10-31 ENCOUNTER — Ambulatory Visit (INDEPENDENT_AMBULATORY_CARE_PROVIDER_SITE_OTHER): Payer: BC Managed Care – PPO | Admitting: Family Medicine

## 2018-10-31 ENCOUNTER — Encounter: Payer: Self-pay | Admitting: Family Medicine

## 2018-10-31 VITALS — BP 126/87 | HR 66 | Temp 97.7°F | Resp 16 | Ht 67.25 in | Wt 255.4 lb

## 2018-10-31 DIAGNOSIS — Z131 Encounter for screening for diabetes mellitus: Secondary | ICD-10-CM | POA: Diagnosis not present

## 2018-10-31 DIAGNOSIS — Z130 Encounter for screening for diseases of the blood and blood-forming organs and certain disorders involving the immune mechanism: Secondary | ICD-10-CM

## 2018-10-31 DIAGNOSIS — E559 Vitamin D deficiency, unspecified: Secondary | ICD-10-CM | POA: Diagnosis not present

## 2018-10-31 DIAGNOSIS — E785 Hyperlipidemia, unspecified: Secondary | ICD-10-CM

## 2018-10-31 DIAGNOSIS — Z0000 Encounter for general adult medical examination without abnormal findings: Secondary | ICD-10-CM | POA: Diagnosis not present

## 2018-10-31 DIAGNOSIS — Z79899 Other long term (current) drug therapy: Secondary | ICD-10-CM | POA: Diagnosis not present

## 2018-10-31 DIAGNOSIS — G43009 Migraine without aura, not intractable, without status migrainosus: Secondary | ICD-10-CM

## 2018-10-31 DIAGNOSIS — E669 Obesity, unspecified: Secondary | ICD-10-CM

## 2018-10-31 LAB — COMPREHENSIVE METABOLIC PANEL
ALT: 23 U/L (ref 0–35)
AST: 17 U/L (ref 0–37)
Albumin: 4.1 g/dL (ref 3.5–5.2)
Alkaline Phosphatase: 78 U/L (ref 39–117)
BUN: 15 mg/dL (ref 6–23)
CO2: 26 mEq/L (ref 19–32)
Calcium: 9.4 mg/dL (ref 8.4–10.5)
Chloride: 105 mEq/L (ref 96–112)
Creatinine, Ser: 0.76 mg/dL (ref 0.40–1.20)
GFR: 87.12 mL/min (ref >60.00)
Glucose, Bld: 93 mg/dL (ref 70–99)
Potassium: 4.6 mEq/L (ref 3.5–5.1)
Sodium: 139 mEq/L (ref 135–145)
Total Bilirubin: 0.5 mg/dL (ref 0.2–1.2)
Total Protein: 6.6 g/dL (ref 6.0–8.3)

## 2018-10-31 LAB — CBC
HCT: 44 % (ref 36.0–46.0)
Hemoglobin: 14.8 g/dL (ref 12.0–15.0)
MCHC: 33.7 g/dL (ref 30.0–36.0)
MCV: 95.7 fl (ref 78.0–100.0)
Platelets: 206 10*3/uL (ref 150.0–400.0)
RBC: 4.6 Mil/uL (ref 3.87–5.11)
RDW: 13.8 % (ref 11.5–15.5)
WBC: 8.4 10*3/uL (ref 4.0–10.5)

## 2018-10-31 LAB — LIPID PANEL
Cholesterol: 169 mg/dL (ref 0–200)
HDL: 55.2 mg/dL (ref >39.00)
LDL Cholesterol: 90 mg/dL (ref 0–99)
NonHDL: 113.82
Total CHOL/HDL Ratio: 3
Triglycerides: 121 mg/dL (ref 0.0–149.0)
VLDL: 24.2 mg/dL (ref 0.0–40.0)

## 2018-10-31 LAB — HEMOGLOBIN A1C: Hgb A1c MFr Bld: 4.9 % (ref 4.6–6.5)

## 2018-10-31 LAB — TSH: TSH: 2.93 u[IU]/mL (ref 0.35–4.50)

## 2018-10-31 LAB — VITAMIN D 25 HYDROXY (VIT D DEFICIENCY, FRACTURES): VITD: 19.29 ng/mL — ABNORMAL LOW (ref 30.00–100.00)

## 2018-10-31 MED ORDER — AMITRIPTYLINE HCL 50 MG PO TABS: 50.0000 mg | ORAL_TABLET | Freq: Every day | ORAL | 3 refills | Status: DC

## 2018-10-31 MED ORDER — ZOLMITRIPTAN 2.5 MG PO TBDP: 2.5000 mg | ORAL_TABLET | ORAL | 11 refills | Status: DC | PRN

## 2018-10-31 NOTE — Patient Instructions (Signed)
Great to see you today. We will call you with lab results.  I have refilled your medications.   Health Maintenance, Female Adopting a healthy lifestyle and getting preventive care are important in promoting health and wellness. Ask your health care provider about:  The right schedule for you to have regular tests and exams.  Things you can do on your own to prevent diseases and keep yourself healthy. What should I know about diet, weight, and exercise? Eat a healthy diet   Eat a diet that includes plenty of vegetables, fruits, low-fat dairy products, and lean protein.  Do not eat a lot of foods that are high in solid fats, added sugars, or sodium. Maintain a healthy weight Body mass index (BMI) is used to identify weight problems. It estimates body fat based on height and weight. Your health care provider can help determine your BMI and help you achieve or maintain a healthy weight. Get regular exercise Get regular exercise. This is one of the most important things you can do for your health. Most adults should:  Exercise for at least 150 minutes each week. The exercise should increase your heart rate and make you sweat (moderate-intensity exercise).  Do strengthening exercises at least twice a week. This is in addition to the moderate-intensity exercise.  Spend less time sitting. Even light physical activity can be beneficial. Watch cholesterol and blood lipids Have your blood tested for lipids and cholesterol at 34 years of age, then have this test every 5 years. Have your cholesterol levels checked more often if:  Your lipid or cholesterol levels are high.  You are older than 34 years of age.  You are at high risk for heart disease. What should I know about cancer screening? Depending on your health history and family history, you may need to have cancer screening at various ages. This may include screening for:  Breast cancer.  Cervical cancer.  Colorectal cancer.   Skin cancer.  Lung cancer. What should I know about heart disease, diabetes, and high blood pressure? Blood pressure and heart disease  High blood pressure causes heart disease and increases the risk of stroke. This is more likely to develop in people who have high blood pressure readings, are of African descent, or are overweight.  Have your blood pressure checked: ? Every 3-5 years if you are 34-34 years of age. ? Every year if you are 34 years old or older. Diabetes Have regular diabetes screenings. This checks your fasting blood sugar level. Have the screening done:  Once every three years after age 100 if you are at a normal weight and have a low risk for diabetes.  More often and at a younger age if you are overweight or have a high risk for diabetes. What should I know about preventing infection? Hepatitis B If you have a higher risk for hepatitis B, you should be screened for this virus. Talk with your health care provider to find out if you are at risk for hepatitis B infection. Hepatitis C Testing is recommended for:  Everyone born from [birth year] through 1966.  Anyone with known risk factors for hepatitis C. Sexually transmitted infections (STIs)  Get screened for STIs, including gonorrhea and chlamydia, if: ? You are sexually active and are younger than 34 years of age. ? You are older than 34 years of age and your health care provider tells you that you are at risk for this type of infection. ? Your sexual activity has changed since  you were last screened, and you are at increased risk for chlamydia or gonorrhea. Ask your health care provider if you are at risk.  Ask your health care provider about whether you are at high risk for HIV. Your health care provider may recommend a prescription medicine to help prevent HIV infection. If you choose to take medicine to prevent HIV, you should first get tested for HIV. You should then be tested every 3 months for as long as you are  taking the medicine. Pregnancy  If you are about to stop having your period (premenopausal) and you may become pregnant, seek counseling before you get pregnant.  Take 400 to 800 micrograms (mcg) of folic acid every day if you become pregnant.  Ask for birth control (contraception) if you want to prevent pregnancy. Osteoporosis and menopause Osteoporosis is a disease in which the bones lose minerals and strength with aging. This can result in bone fractures. If you are 34 years old or older, or if you are at risk for osteoporosis and fractures, ask your health care provider if you should:  Be screened for bone loss.  Take a calcium or vitamin D supplement to lower your risk of fractures.  Be given hormone replacement therapy (HRT) to treat symptoms of menopause. Follow these instructions at home: Lifestyle  Do not use any products that contain nicotine or tobacco, such as cigarettes, e-cigarettes, and chewing tobacco. If you need help quitting, ask your health care provider.  Do not use street drugs.  Do not share needles.  Ask your health care provider for help if you need support or information about quitting drugs. Alcohol use  Do not drink alcohol if: ? Your health care provider tells you not to drink. ? You are pregnant, may be pregnant, or are planning to become pregnant.  If you drink alcohol: ? Limit how much you use to 0-1 drink a day. ? Limit intake if you are breastfeeding.  Be aware of how much alcohol is in your drink. In the [place] one drink equals one 12 oz bottle of beer (355 mL), one 5 oz glass of wine (148 mL), or one 1 oz glass of hard liquor (44 mL). General instructions  Schedule regular health, dental, and eye exams.  Stay current with your vaccines.  Tell your health care provider if: ? You often feel depressed. ? You have ever been abused or do not feel safe at home. Summary  Adopting a healthy lifestyle and getting preventive care are important  in promoting health and wellness.  Follow your health care provider's instructions about healthy diet, exercising, and getting tested or screened for diseases.  Follow your health care provider's instructions on monitoring your cholesterol and blood pressure. This information is not intended to replace advice given to you by your health care provider. Make sure you discuss any questions you have with your health care provider. Document Released: 07/28/2010 Document Revised: 01/05/2018 Document Reviewed: 01/05/2018 Elsevier Patient Education  2020 [employer].

## 2018-10-31 NOTE — Progress Notes (Signed)
Patient ID: Wendy Molina, female  DOB: 09-Jul-1985, 33 y.o.   MRN: [id number] Patient Care Team    Relationship Specialty Notifications Start End  Ma Hillock, DO PCP - General Family Medicine  02/11/15   Nunzio Cobbs, MD Consulting Physician Obstetrics and Gynecology  10/26/16     Chief Complaint  Patient presents with  . Annual Exam    Pap smear- 08/2018. No complaints, pt is fasting, declines flu vaccine. Refills needed on migraine medication     Subjective:  Wendy Molina is a 33 y.o.  Female  present for CPE. All past medical history, surgical history, allergies, family history, immunizations, medications and social history were updated in the electronic medical record today. All recent labs, ED visits and hospitalizations within the last year were reviewed.  Migraine Patient reports compliance with her amitriptyline 50 mg before bed. She needs refills on meds today and has not routinely  needed any break through meds but would like refills on both medications today- amitrip and zomig.   Health maintenance: upated 10/31/18 Colonoscopy: No Fhx, screen at 40 Mammogram: No Fhx screen at 39 Cervical cancer screening: last pap: 08/2017,  Dr. Quincy Simmonds, Normal  Immunizations: tdap 2014, Influenza declined flu (encouraged yearly) Infectious disease screening: HIV 04/06/2014 DEXA:N/A Assistive device: none Oxygen LYY:TKPT Patient has a Dental home. Hospitalizations/ED visits: reviewed   Depression screen [facility] 2/9 10/31/2018 10/28/2017 10/26/2016 10/23/2015  Decreased Interest 0 0 0 0  Down, Depressed, Hopeless 0 0 0 0  PHQ - 2 Score 0 0 0 0   No flowsheet data found.   Immunization History  Administered Date(s) Administered  . HPV 9-valent 10/01/2017, 11/26/2017, 09/21/2018  . Tdap 06/22/2012     Past Medical History:  Diagnosis Date  . Broken foot 08/2014   left  . Headache(784.0) 2016   migraine w/o aura  . Hyperlipidemia   . Low vitamin D  level   . Migraines   . Plantar fasciitis of left foot   . Stress fracture of left foot 07/2015   confirmed with MRI   No Known Allergies Past Surgical History:  Procedure Laterality Date  . INTRAUTERINE DEVICE (IUD) INSERTION     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/01/14  . INTRAUTERINE DEVICE INSERTION  11/2016   Kyleena    Family History  Problem Relation Age of Onset  . Arthritis Mother   . Colitis Mother   . Lung cancer Maternal Grandmother        smoker  . Cancer Maternal Grandmother        kidney  . Leukemia Paternal Grandmother        unsure correct type of cancer  . Heart failure Paternal Grandmother   . Alzheimer's disease Maternal Grandfather   . Kidney failure Paternal Grandfather        ? dialysis  . Heart disease Maternal Aunt        MI 42-56   Social History   Socioeconomic History  . Marital status: Single    Spouse name: Not on file  . Number of children: 0  . Years of education: Not on file  . Highest education level: Not on file  Occupational History  . Occupation: mortgage Science writer  . Financial resource strain: Not on file  . Food insecurity    Worry: Not on file    Inability: Not on file  . Transportation needs    Medical: Not on file    Non-medical:  Not on file  Tobacco Use  . Smoking status: Never Smoker  . Smokeless tobacco: Never Used  Substance and Sexual Activity  . Alcohol use: Yes    Alcohol/week: 2.0 standard drinks    Types: 2 Standard drinks or equivalent per week  . Drug use: No  . Sexual activity: Yes    Partners: Male    Birth control/protection: I.U.D.    CommentThreasa Heads 01-07-17  Lifestyle  . Physical activity    Days per week: Not on file    Minutes per session: Not on file  . Stress: Not on file  Relationships  . Social Herbalist on phone: Not on file    Gets together: Not on file    Attends religious service: Not on file    Active member of club or organization: Not on file    Attends meetings of  clubs or organizations: Not on file    Relationship status: Not on file  . Intimate partner violence    Fear of current or ex partner: Not on file    Emotionally abused: Not on file    Physically abused: Not on file    Forced sexual activity: Not on file  Other Topics Concern  . Not on file  Social History Narrative   Single- in a relationship. No children. 3 cats.    Works for [employer]   Take a daily vitamin   Wears her seatbelt, smoke detector in the home.   Hobbies: travel, reading, hiking   Feels safe in relationships   Allergies as of 10/31/2018   No Known Allergies     Medication List       Accurate as of October 31, 2018  8:42 AM. If you have any questions, ask your nurse or doctor.        amitriptyline 50 MG tablet Commonly known as: ELAVIL Take 1 tablet (50 mg total) by mouth at bedtime.   Kyleena 19.5 MG Iud Generic drug: Levonorgestrel by Intrauterine route.   multivitamin tablet Take 1 tablet by mouth daily.   zolmitriptan 2.5 MG disintegrating tablet Commonly known as: ZOMIG-ZMT Take 1 tablet (2.5 mg total) by mouth as needed for migraine (may take repeat dose 2 hours later x1).       All past medical history, surgical history, allergies, family history, immunizations andmedications were updated in the EMR today and reviewed under the history and medication portions of their EMR.      No results found.   ROS: 14 pt review of systems performed and negative (unless mentioned in an HPI)  Objective: BP 126/87 (BP Location: Right Arm, Patient Position: Sitting, Cuff Size: Normal)   Pulse 66   Temp 97.7 F (36.5 C) (Temporal)   Resp 16   Ht 5' 7.25" (1.708 m)   Wt 255 lb 6 oz (115.8 kg)   SpO2 94%   BMI 39.70 kg/m   Gen: Afebrile. No acute distress. Well developed, well nourished. obese female.  HENT: AT. [place]. Bilateral TM visualized and normal in appearance. MMM. Bilateral nares w/o erythema or swelling. Throat without  erythema or exudates. No cough or hoarseness.  Eyes:Pupils Equal Round Reactive to light, Extraocular movements intact,  Conjunctiva without redness, discharge or icterus. Neck/lymp/endocrine: Supple,no lymphadenopathy, no thyromegaly CV: RRR no murmur, no edema, +2/4 P posterior tibialis pulses Chest: CTAB, no wheeze or crackles Abd: Soft. obese. NTND. BS present. no Masses palpated.  Skin: no rashes, purpura or petechiae.  Neuro/MSK:  Normal gait. PERLA. EOMi. Alert. Oriented. Cranial nerves II through XII intact. Muscle strength 5/5 B/L extremity. DTRs equal bilaterally. Psych: Normal affect, dress and demeanor. Normal speech. Normal thought content and judgment.  No exam data present  Assessment/plan: DEVOTA VIRUET is a 33 y.o. female present for CPE. Hyperlipidemia, unspecified hyperlipidemia type/encounter for long-term med use/obesity - stable. Working from home, not exercising routinely -Diet and exercise regimen encouraged - Comp Met (CMET) - Lipid panel - TSH Vitamin D deficiency - Vitamin D (25 hydroxy) - currently not taking vit d supplement.  Screening for iron deficiency anemia - CBC w/Diff Screening for diabetes mellitus - HgB A1c Migraine without aura and without status migrainosus, not intractable - stable. Doing well on med. Hardly any breakthrough.  - continue amitriptyline 50 mg nightly. - amitriptyline (ELAVIL) 50 MG tablet; Take 1 tablet (50 mg total) by mouth at bedtime.  Dispense: 90 tablet; Refill: 3 -Follow up 6 months Encounter for preventive health examination Patient was encouraged to exercise greater than 150 minutes a week. Patient was encouraged to choose a diet filled with fresh fruits and vegetables, and lean meats. AVS provided to patient today for education/recommendation on gender specific health and safety maintenance. Colonoscopy: No Fhx, screen at 49 Mammogram: No Fhx screen at 40 Cervical cancer screening: last pap: 08/2017,  Dr.  Quincy Simmonds, Normal  Immunizations: tdap 2014, Influenza declined flu (encouraged yearly) Infectious disease screening: HIV 04/06/2014 DEXA:N/A  Return in about 1 year (around 10/31/2019) for CPE (30 min).   Annual physical plus > 10 minutes spent with patient covering chronic problems.   Orders Placed This Encounter  Procedures  . CBC  . Comprehensive metabolic panel  . Hemoglobin A1c  . Lipid panel  . TSH  . Vitamin D (25 hydroxy)    Electronically signed by: Howard Pouch, [place]

## 2018-10-31 NOTE — Telephone Encounter (Signed)
Please inform patient the following information: Her labs are all normal with the exception of her vit d is 19. I would advise she take OTC 1000d with food daily. Normal levels should be up in the 30-50 range.

## 2018-11-18 ENCOUNTER — Telehealth: Payer: BC Managed Care – PPO | Admitting: Nurse Practitioner

## 2018-11-18 ENCOUNTER — Encounter (INDEPENDENT_AMBULATORY_CARE_PROVIDER_SITE_OTHER): Payer: Self-pay

## 2018-11-18 ENCOUNTER — Other Ambulatory Visit: Payer: Self-pay

## 2018-11-18 DIAGNOSIS — Z20822 Contact with and (suspected) exposure to covid-19: Secondary | ICD-10-CM

## 2018-11-18 DIAGNOSIS — R05 Cough: Secondary | ICD-10-CM

## 2018-11-18 DIAGNOSIS — R059 Cough, unspecified: Secondary | ICD-10-CM

## 2018-11-18 MED ORDER — PROMETHAZINE-DM 6.25-15 MG/5ML PO SYRP: 5.0000 mL | ORAL_SOLUTION | Freq: Four times a day (QID) | ORAL | 0 refills | Status: AC | PRN

## 2018-11-18 NOTE — Progress Notes (Signed)
E-Visit for Corona Virus Screening   Your current symptoms could be consistent with the coronavirus.  Many health care providers can now test patients at their office but not all are.  [facility] has multiple testing sites. For information on our COVID testing locations and hours go to [email or web address]  Please quarantine yourself while awaiting your test results.  We are enrolling you in our MyChart Home Montioring for COVID19 . Daily you will receive a questionnaire within the MyChart website. Our COVID 19 response team willl be monitoriing your responses daily.    COVID-19 is a respiratory illness with symptoms that are similar to the flu. Symptoms are typically mild to moderate, but there have been cases of severe illness and death due to the virus. The following symptoms may appear 2-14 days after exposure: . Fever . Cough . Shortness of breath or difficulty breathing . Chills . Repeated shaking with chills . Muscle pain . Headache . Sore throat . New loss of taste or smell . Fatigue . Congestion or runny nose . Nausea or vomiting . Diarrhea  It is vitally important that if you feel that you have an infection such as this virus or any other virus that you stay home and away from places where you may spread it to others.  You should self-quarantine for 14 days if you have symptoms that could potentially be coronavirus or have been in close contact a with a person diagnosed with COVID-19 within the last 2 weeks. You should avoid contact with people age 65 and older.   You should wear a mask or cloth face covering over your nose and mouth if you must be around other people or animals, including pets (even at home). Try to stay at least 6 feet away from other people. This will protect the people around you.  You can use medication such as A prescription cough medication called Phenergan DM 6.25 mg/15 mg. You make take one teaspoon / 5 ml every 4-6 hours as  needed for cough  You may also take acetaminophen (Tylenol) as needed for fever.   Reduce your risk of any infection by using the same precautions used for avoiding the common cold or flu:  . Wash your hands often with soap and warm water for at least 20 seconds.  If soap and water are not readily available, use an alcohol-based hand sanitizer with at least 60% alcohol.  . If coughing or sneezing, cover your mouth and nose by coughing or sneezing into the elbow areas of your shirt or coat, into a tissue or into your sleeve (not your hands). . Avoid shaking hands with others and consider head nods or verbal greetings only. . Avoid touching your eyes, nose, or mouth with unwashed hands.  . Avoid close contact with people who are sick. . Avoid places or events with large numbers of people in one location, like concerts or sporting events. . Carefully consider travel plans you have or are making. . If you are planning any travel outside or inside the [place], visit the CDC's Travelers' Health webpage for the latest health notices. . If you have some symptoms but not all symptoms, continue to monitor at home and seek medical attention if your symptoms worsen. . If you are having a medical emergency, call 911.  HOME CARE . Only take medications as instructed by your medical team. . Drink plenty of fluids and get plenty of rest. . A steam or ultrasonic humidifier can help if you   have congestion.   GET HELP RIGHT AWAY IF YOU HAVE EMERGENCY WARNING SIGNS** FOR COVID-19. If you or someone is showing any of these signs seek emergency medical care immediately. Call 911 or proceed to your closest emergency facility if: . You develop worsening high fever. . Trouble breathing . Bluish lips or face . Persistent pain or pressure in the chest . New confusion . Inability to wake or stay awake . You cough up blood. . Your symptoms become more severe  **This list is not all possible symptoms. Contact your  medical provider for any symptoms that are sever or concerning to you.   MAKE SURE YOU   Understand these instructions.  Will watch your condition.  Will get help right away if you are not doing well or get worse.  Your e-visit answers were reviewed by a board certified advanced clinical practitioner to complete your personal care plan.  Depending on the condition, your plan could have included both over the counter or prescription medications.  If there is a problem please reply once you have received a response from your provider.  Your safety is important to us.  If you have drug allergies check your prescription carefully.    You can use MyChart to ask questions about today's visit, request a non-urgent call back, or ask for a work or school excuse for 24 hours related to this e-Visit. If it has been greater than 24 hours you will need to follow up with your provider, or enter a new e-Visit to address those concerns. You will get an e-mail in the next two days asking about your experience.  I hope that your e-visit has been valuable and will speed your recovery. Thank you for using e-visits.   I have spent 5-10 minutes reviewing and documenting in the patient's chart. 

## 2018-11-19 ENCOUNTER — Encounter (INDEPENDENT_AMBULATORY_CARE_PROVIDER_SITE_OTHER): Payer: Self-pay

## 2018-11-19 LAB — NOVEL CORONAVIRUS, NAA: SARS-CoV-2, NAA: NOT DETECTED

## 2018-11-22 ENCOUNTER — Encounter (INDEPENDENT_AMBULATORY_CARE_PROVIDER_SITE_OTHER): Payer: Self-pay

## 2018-11-24 ENCOUNTER — Encounter (INDEPENDENT_AMBULATORY_CARE_PROVIDER_SITE_OTHER): Payer: Self-pay

## 2018-11-27 ENCOUNTER — Encounter (INDEPENDENT_AMBULATORY_CARE_PROVIDER_SITE_OTHER): Payer: Self-pay

## 2018-11-29 ENCOUNTER — Encounter (INDEPENDENT_AMBULATORY_CARE_PROVIDER_SITE_OTHER): Payer: Self-pay

## 2018-11-30 ENCOUNTER — Encounter (INDEPENDENT_AMBULATORY_CARE_PROVIDER_SITE_OTHER): Payer: Self-pay

## 2019-04-14 ENCOUNTER — Encounter: Payer: Self-pay | Admitting: Certified Nurse Midwife

## 2019-04-27 ENCOUNTER — Ambulatory Visit: Payer: BC Managed Care – PPO | Attending: Internal Medicine

## 2019-04-27 DIAGNOSIS — Z23 Encounter for immunization: Secondary | ICD-10-CM

## 2019-04-27 NOTE — Progress Notes (Signed)
   Covid-19 Vaccination Clinic  Name:  Wendy Molina    MRN: [id number] DOB: 1985-04-13  04/27/2019  Wendy Molina was observed post Covid-19 immunization for 15 minutes without incident. She was provided with Vaccine Information Sheet and instruction to access the V-Safe system.   Wendy Molina was instructed to call 911 with any severe reactions post vaccine: Marland Kitchen Difficulty breathing  . Swelling of face and throat  . A fast heartbeat  . A bad rash all over body  . Dizziness and weakness   Immunizations Administered    Name Date Dose VIS Date Route   Pfizer COVID-19 Vaccine 04/27/2019  9:30 AM 0.3 mL 01/06/2019 Intramuscular   Manufacturer: [employer], [employer]   Lot: [id number]   NDC: [address]-7

## 2019-05-09 ENCOUNTER — Ambulatory Visit: Payer: BC Managed Care – PPO | Admitting: Podiatry

## 2019-05-09 ENCOUNTER — Ambulatory Visit (INDEPENDENT_AMBULATORY_CARE_PROVIDER_SITE_OTHER): Payer: BC Managed Care – PPO

## 2019-05-09 ENCOUNTER — Other Ambulatory Visit: Payer: Self-pay | Admitting: Podiatry

## 2019-05-09 ENCOUNTER — Other Ambulatory Visit: Payer: Self-pay

## 2019-05-09 ENCOUNTER — Encounter: Payer: Self-pay | Admitting: Podiatry

## 2019-05-09 DIAGNOSIS — M778 Other enthesopathies, not elsewhere classified: Secondary | ICD-10-CM

## 2019-05-09 DIAGNOSIS — M7662 Achilles tendinitis, left leg: Secondary | ICD-10-CM

## 2019-05-09 DIAGNOSIS — M7661 Achilles tendinitis, right leg: Secondary | ICD-10-CM

## 2019-05-09 NOTE — Progress Notes (Signed)
She presents today after having not seen her for couple of years with a chief complaint of swelling event that she has recently had that developed out of the blue with very little pain across the dorsum of the left foot.  She states that it was red hot swollen mildly tender.  She states that she never really got over plantar fasciitis of that left heel 100%.  ROS: Denies fever chills nausea vomiting muscle aches pains calf pain back pain chest pain shortness of breath.  Objective: Vital signs are stable alert and oriented x3.  Pulses are palpable.  Neurologic sensorium is intact.  Deep tendon reflexes are intact.  Muscle strength is normal symmetrical bilateral.  Orthopedic evaluation demonstrates all joints distal to the ankle full range of motion.  She does have some mild edema overlying the dorsal aspect of the left foot though it is not pitting.  She has no pain on palpation of the underlying muscles or bones.  Radiographically there are no stress fractures or any type of acute abnormalities.  She also does not demonstrate any erythema at this point and no warmth on palpation.  She has good range of motion of all of the toes but radiographically laterally it does look very edematous.  Assessment: Capsulitis metatarsalgia with swelling event cannot rule out arthritides.  Plan: At this point I feel that we need to at least rule out the seropositive arthropathies so I am sending her for blood work.

## 2019-05-11 LAB — CBC WITH DIFFERENTIAL/PLATELET
Absolute Monocytes: 774 cells/uL (ref 200–950)
Basophils Absolute: 70 cells/uL (ref 0–200)
Basophils Relative: 0.8 %
Eosinophils Absolute: 70 cells/uL (ref 15–500)
Eosinophils Relative: 0.8 %
HCT: 44.8 % (ref 35.0–45.0)
Hemoglobin: 14.6 g/dL (ref 11.7–15.5)
Lymphs Abs: 2411 cells/uL (ref 850–3900)
MCH: 31.3 pg (ref 27.0–33.0)
MCHC: 32.6 g/dL (ref 32.0–36.0)
MCV: 95.9 fL (ref 80.0–100.0)
MPV: 11.4 fL (ref 7.5–12.5)
Monocytes Relative: 8.8 %
Neutro Abs: 5474 cells/uL (ref 1500–7800)
Neutrophils Relative %: 62.2 %
Platelets: 242 10*3/uL (ref 140–400)
RBC: 4.67 10*6/uL (ref 3.80–5.10)
RDW: 12.9 % (ref 11.0–15.0)
Total Lymphocyte: 27.4 %
WBC: 8.8 10*3/uL (ref 3.8–10.8)

## 2019-05-11 LAB — ANTI-NUCLEAR AB-TITER (ANA TITER): ANA Titer 1: 1:80 {titer} — ABNORMAL HIGH

## 2019-05-11 LAB — ANA: Anti Nuclear Antibody (ANA): POSITIVE — AB

## 2019-05-11 LAB — RHEUMATOID FACTOR: Rheumatoid fact SerPl-aCnc: <14 IU/mL (ref <14)

## 2019-05-11 LAB — URIC ACID: Uric Acid, Serum: 5.7 mg/dL (ref 2.5–7.0)

## 2019-05-11 LAB — C-REACTIVE PROTEIN: CRP: 9.5 mg/L — ABNORMAL HIGH (ref <8.0)

## 2019-05-11 LAB — SEDIMENTATION RATE: Sed Rate: 9 mm/h (ref 0–20)

## 2019-05-12 ENCOUNTER — Telehealth: Payer: Self-pay

## 2019-05-12 ENCOUNTER — Telehealth: Payer: Self-pay | Admitting: *Deleted

## 2019-05-12 DIAGNOSIS — M778 Other enthesopathies, not elsewhere classified: Secondary | ICD-10-CM

## 2019-05-12 NOTE — Telephone Encounter (Signed)
Patient has been notified of results and informed of referral.

## 2019-05-12 NOTE — Telephone Encounter (Signed)
-----   Message from Elinor Parkinson, [place] sent at 05/11/2019  4:49 PM EDT ----- Send to Rheumatology.  Glenis Smoker MD.

## 2019-05-12 NOTE — Telephone Encounter (Signed)
Faxed required referral form, clinicals and demographics to [facility] - Dr. Nickola Major.

## 2019-05-12 NOTE — Addendum Note (Signed)
Addended by: Alphia Kava D on: 05/12/2019 01:16 PM   Modules accepted: Orders

## 2019-05-12 NOTE — Telephone Encounter (Signed)
Left message for pt to call for results  

## 2019-05-12 NOTE — Telephone Encounter (Signed)
-----   Message from Marissa Nestle, RN sent at 05/12/2019 12:03 PM EDT ----- Please assist pt. Wendy Molina ----- Message ----- From: Elinor Parkinson, [place] Sent: 05/11/2019   4:49 PM EDT To: Marissa Nestle, RN  Send to Rheumatology.  Glenis Smoker MD.

## 2019-05-23 ENCOUNTER — Ambulatory Visit: Payer: BC Managed Care – PPO | Attending: Internal Medicine

## 2019-05-23 DIAGNOSIS — Z23 Encounter for immunization: Secondary | ICD-10-CM

## 2019-05-23 NOTE — Progress Notes (Signed)
   Covid-19 Vaccination Clinic  Name:  KATALIN COLLEDGE    MRN: [id number] DOB: 1985-04-02  05/23/2019  Ms. Laguardia was observed post Covid-19 immunization for 15 minutes without incident. She was provided with Vaccine Information Sheet and instruction to access the V-Safe system.   Ms. Mascari was instructed to call 911 with any severe reactions post vaccine: Marland Kitchen Difficulty breathing  . Swelling of face and throat  . A fast heartbeat  . A bad rash all over body  . Dizziness and weakness   Immunizations Administered    Name Date Dose VIS Date Route   Pfizer COVID-19 Vaccine 05/23/2019  9:19 AM 0.3 mL 03/22/2018 Intramuscular   Manufacturer: [employer], [employer]   Lot: [id number]   NDC: [address]-2

## 2019-05-23 NOTE — Telephone Encounter (Signed)
Faxed copy of pt's 05/09/2019 labs to Dr. Nickola Major.

## 2019-05-26 DIAGNOSIS — M7989 Other specified soft tissue disorders: Secondary | ICD-10-CM | POA: Diagnosis not present

## 2019-05-26 DIAGNOSIS — R768 Other specified abnormal immunological findings in serum: Secondary | ICD-10-CM | POA: Diagnosis not present

## 2019-07-27 ENCOUNTER — Telehealth: Payer: Self-pay | Admitting: Obstetrics and Gynecology

## 2019-07-27 ENCOUNTER — Other Ambulatory Visit: Payer: Self-pay

## 2019-07-27 ENCOUNTER — Ambulatory Visit (INDEPENDENT_AMBULATORY_CARE_PROVIDER_SITE_OTHER): Payer: BC Managed Care – PPO | Admitting: Obstetrics and Gynecology

## 2019-07-27 ENCOUNTER — Encounter: Payer: Self-pay | Admitting: Obstetrics and Gynecology

## 2019-07-27 VITALS — BP 138/84 | HR 100 | Temp 97.9°F | Ht 65.5 in | Wt 258.6 lb

## 2019-07-27 DIAGNOSIS — R109 Unspecified abdominal pain: Secondary | ICD-10-CM

## 2019-07-27 DIAGNOSIS — Z113 Encounter for screening for infections with a predominantly sexual mode of transmission: Secondary | ICD-10-CM | POA: Diagnosis not present

## 2019-07-27 DIAGNOSIS — N760 Acute vaginitis: Secondary | ICD-10-CM

## 2019-07-27 DIAGNOSIS — R350 Frequency of micturition: Secondary | ICD-10-CM | POA: Diagnosis not present

## 2019-07-27 DIAGNOSIS — R8290 Unspecified abnormal findings in urine: Secondary | ICD-10-CM | POA: Diagnosis not present

## 2019-07-27 DIAGNOSIS — N930 Postcoital and contact bleeding: Secondary | ICD-10-CM

## 2019-07-27 LAB — POCT URINALYSIS DIPSTICK
Bilirubin, UA: NEGATIVE
Glucose, UA: NEGATIVE
Ketones, UA: NEGATIVE
Nitrite, UA: NEGATIVE
Protein, UA: NEGATIVE
Urobilinogen, UA: 0.2 E.U./dL
pH, UA: 5 (ref 5.0–8.0)

## 2019-07-27 MED ORDER — SULFAMETHOXAZOLE-TRIMETHOPRIM 800-160 MG PO TABS: 1.0000 | ORAL_TABLET | Freq: Two times a day (BID) | ORAL | 0 refills | Status: DC

## 2019-07-27 NOTE — Telephone Encounter (Signed)
Call reviewed with Dr. Edward Jolly, call returned to patient. Patient rescheduled for OV today at 4:30pm with Dr. Edward Jolly. Covid 19 prescreen negative, precautions reviewed. Patient verbalizes understanding and is agreeable. OV cancelled for 08/03/19.

## 2019-07-27 NOTE — Telephone Encounter (Signed)
Patient has a yeast infection or uti.

## 2019-07-27 NOTE — Telephone Encounter (Signed)
Spoke with patient. Patient reports bladder discomfort for 2 wks, has increased over the past few days. Reports vaginal itching, skin burns when urine touches the skin and aching in bladder/pelvis. Reports chronic lower back pain and pain on right flank. Denies urinary frequency, urgency, fever/chills, N/V, dysuria, hematuria, vaginal d/c or odor or irregular bleeding. Has not tried anything OTC, requesting an OV.   Last AEX 09/21/18.   OV scheduled for 7/8 at 4:30pm with Dr. Edward Jolly. Patient is going to contact PCP to see if earlier appt available. ER/Urgent care precautions reviewed for new or worsening symptoms. Advised Dr. Edward Jolly will review, our office will return call if any additional recommendations. Patient agreeable.   Routing to provider for final review. Patient is agreeable to disposition. Will close encounter.

## 2019-07-27 NOTE — Progress Notes (Signed)
GYNECOLOGY  VISIT   HPI: 34 y.o.   Single  Caucasian  female   G0P0000 with No LMP recorded. (Menstrual status: IUD).   here for vaginal itching, lower pelvic discomfort and low back/Rt.flank discomfort. She also complains of urinary frequency.  Noted a little cut a couple of weeks ago on her right labia.  She is feeling aching, itching on the left labia.  It is getting worse. Nothing makes it better or worse.   She has pain with urination, dysuria.  She also has pain when the urine hits the vulva.  Last UTI was years ago.   Likes her IUD.  She state she has miny menses and some bleeding after sex, which occurs about 60% of the time for the last year or two.   No known hx of HSV 1.  She did have a crusting sore area on the side of her mouth and now wonders if it were HSV.   No change in personal products.  No partner change.   Urine Dip: 1+WBCs, Mod RBC's  GYNECOLOGIC HISTORY: No LMP recorded. (Menstrual status: IUD). Contraception: Rutha Bouchard 01-07-17 Menopausal hormone therapy:  none Last mammogram:  n/a Last pap smear:   09-17-17 Neg:Neg HR HPV, 08-14-14 Neg:Neg HR HPV        OB History    Gravida  0   Para  0   Term  0   Preterm  0   AB  0   Living  0     SAB  0   TAB  0   Ectopic  0   Multiple  0   Live Births  0              Patient Active Problem List   Diagnosis Date Noted  . Hyperlipidemia   . Encounter for long-term current use of medication 10/23/2015  . Obesity (BMI 30-39.9) 10/23/2015  . Vitamin D deficiency 02/12/2015  . Migraine without aura     Past Medical History:  Diagnosis Date  . Broken foot 08/2014   left  . Headache(784.0) 2016   migraine w/o aura  . Hyperlipidemia   . Low vitamin D level   . Migraines   . Plantar fasciitis of left foot   . Stress fracture of left foot 07/2015   confirmed with MRI    Past Surgical History:  Procedure Laterality Date  . INTRAUTERINE DEVICE (IUD) INSERTION     Skyla inserted  01/01/14  . INTRAUTERINE DEVICE INSERTION  11/2016   Kyleena     Current Outpatient Medications  Medication Sig Dispense Refill  . amitriptyline (ELAVIL) 50 MG tablet Take 1 tablet (50 mg total) by mouth at bedtime. 90 tablet 3  . Levonorgestrel (KYLEENA) 19.5 MG IUD by Intrauterine route.     No current facility-administered medications for this visit.     ALLERGIES: Patient has no known allergies.  Family History  Problem Relation Age of Onset  . Arthritis Mother   . Colitis Mother   . Lung cancer Maternal Grandmother        smoker  . Cancer Maternal Grandmother        kidney  . Leukemia Paternal Grandmother        unsure correct type of cancer  . Heart failure Paternal Grandmother   . Alzheimer's disease Maternal Grandfather   . Kidney failure Paternal Grandfather        ? dialysis  . Heart disease Maternal Aunt  MI 53-56    Social History   Socioeconomic History  . Marital status: Single    Spouse name: Not on file  . Number of children: 0  . Years of education: Not on file  . Highest education level: Not on file  Occupational History  . Occupation: Mudlogger  Tobacco Use  . Smoking status: Never Smoker  . Smokeless tobacco: Never Used  Vaping Use  . Vaping Use: Never used  Substance and Sexual Activity  . Alcohol use: Yes    Alcohol/week: 2.0 standard drinks    Types: 2 Standard drinks or equivalent per week  . Drug use: No  . Sexual activity: Yes    Partners: Male    Birth control/protection: I.U.D.    CommentSharyne Richters 01-07-17  Other Topics Concern  . Not on file  Social History Narrative   Single- in a relationship. No children. 3 cats.    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   Hobbies: travel, reading, hiking   Feels safe in relationships   Social Determinants of Health   Financial Resource Strain:   . Difficulty of Paying Living Expenses:    Food Insecurity:   . Worried About Programme researcher, broadcasting/film/video in the Last Year:   . Barista in the Last Year:   Transportation Needs:   . Freight forwarder (Medical):   Marland Kitchen Lack of Transportation (Non-Medical):   Physical Activity:   . Days of Exercise per Week:   . Minutes of Exercise per Session:   Stress:   . Feeling of Stress :   Social Connections:   . Frequency of Communication with Friends and Family:   . Frequency of Social Gatherings with Friends and Family:   . Attends Religious Services:   . Active Member of Clubs or Organizations:   . Attends Banker Meetings:   Marland Kitchen Marital Status:   Intimate Partner Violence:   . Fear of Current or Ex-Partner:   . Emotionally Abused:   Marland Kitchen Physically Abused:   . Sexually Abused:     Review of Systems  Genitourinary: Positive for flank pain (right) and frequency.  Musculoskeletal: Positive for back pain.  All other systems reviewed and are negative.   PHYSICAL EXAMINATION:    BP 138/84   Pulse 100   Temp 97.9 F (36.6 C) (Oral)   Ht 5' 5.5" (1.664 m)   Wt 258 lb 9.6 oz (117.3 kg)   BMI 42.38 kg/m     General appearance: alert, cooperative and appears stated age Abdomen: soft, non-tender, no masses,  no organomegaly Back - no CVA No abnormal inguinal nodes palpated   Pelvic: External genitalia:  no lesions              Urethra:  normal appearing urethra with no masses, tenderness or lesions              Bartholins and Skenes: normal                 Vagina: normal appearing vagina with normal color and discharge, no lesions              Cervix: no lesions.  No CMT.  Friable cervix.  IUD strings noted.                 Bimanual Exam:  Uterus:  normal size, contour, position, consistency,  mobility, non-tender              Adnexa: no mass, fullness, tenderness              Chaperone was present for exam.  ASSESSMENT  Cystitis STD screening Vaginitis Post coital bleeding.  Friable cervix.  Kyleena     PLAN  Nuswab for testing of BV, yeast, trich, GC/CT. Bactrim DS po bid x 3 days.  Urine micro and culture. Consider serum STD testing including HSV testing at annual exam.   __27____ minutes total time.

## 2019-07-28 LAB — URINE CULTURE

## 2019-07-28 LAB — URINALYSIS, MICROSCOPIC ONLY: Casts: NONE SEEN /lpf

## 2019-07-29 LAB — NUSWAB VAGINITIS PLUS (VG+)
Chlamydia trachomatis, NAA: NEGATIVE
Neisseria gonorrhoeae, NAA: NEGATIVE
Trich vag by NAA: NEGATIVE

## 2019-08-02 ENCOUNTER — Telehealth: Payer: Self-pay

## 2019-08-02 NOTE — Progress Notes (Signed)
GYNECOLOGY  VISIT   HPI: 34 y.o.   Single  Caucasian  female   G0P0000 with No LMP recorded. (Menstrual status: IUD).   here for retesting for vaginitis of NuSwab.     Seen for urinary frequency and was treated for potential UTI 07/27/19.  Treated with Bactrim DS but the final UC was negative.  She took two doses.  Her cervix was friable.  She tested negative for GC/CT/trich but the BV and candida testing could not be performed due to an interfering substance.   She is feeling better but is still having some pain.  Feels "blah." Not hurting inside quite as much. It feels uncomfortable with initiation of voiding.   GYNECOLOGIC HISTORY: No LMP recorded. (Menstrual status: IUD). Contraception: Rutha Bouchard 01-07-17 Menopausal hormone therapy:  none Last mammogram:  n/a Last pap smear: 09-17-17 Neg:Neg HR HPV, 08-14-14 Neg:Neg HR HPV        OB History    Gravida  0   Para  0   Term  0   Preterm  0   AB  0   Living  0     SAB  0   TAB  0   Ectopic  0   Multiple  0   Live Births  0              Patient Active Problem List   Diagnosis Date Noted  . Hyperlipidemia   . Encounter for long-term current use of medication 10/23/2015  . Obesity (BMI 30-39.9) 10/23/2015  . Vitamin D deficiency 02/12/2015  . Migraine without aura     Past Medical History:  Diagnosis Date  . Broken foot 08/2014   left  . Headache(784.0) 2016   migraine w/o aura  . Hyperlipidemia   . Low vitamin D level   . Migraines   . Plantar fasciitis of left foot   . Stress fracture of left foot 07/2015   confirmed with MRI    Past Surgical History:  Procedure Laterality Date  . INTRAUTERINE DEVICE (IUD) INSERTION     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/01/14  . INTRAUTERINE DEVICE INSERTION  11/2016   Kyleena     Current Outpatient Medications  Medication Sig Dispense Refill  . amitriptyline (ELAVIL) 50 MG tablet Take 1 tablet (50 mg total) by mouth at bedtime. 90 tablet 3  . Levonorgestrel (KYLEENA)  19.5 MG IUD by Intrauterine route.     No current facility-administered medications for this visit.     ALLERGIES: Patient has no known allergies.  Family History  Problem Relation Age of Onset  . Arthritis Mother   . Colitis Mother   . Lung cancer Maternal Grandmother        smoker  . Cancer Maternal Grandmother        kidney  . Leukemia Paternal Grandmother        unsure correct type of cancer  . Heart failure Paternal Grandmother   . Alzheimer's disease Maternal Grandfather   . Kidney failure Paternal Grandfather        ? dialysis  . Heart disease Maternal Aunt        MI 10-56    Social History   Socioeconomic History  . Marital status: Single    Spouse name: Not on file  . Number of children: 0  . Years of education: Not on file  . Highest education level: Not on file  Occupational History  . Occupation: Mudlogger  Tobacco Use  . Smoking status: Never  Smoker  . Smokeless tobacco: Never Used  Vaping Use  . Vaping Use: Never used  Substance and Sexual Activity  . Alcohol use: Yes    Alcohol/week: 2.0 standard drinks    Types: 2 Standard drinks or equivalent per week  . Drug use: No  . Sexual activity: Yes    Partners: Male    Birth control/protection: I.U.D.    CommentSharyne Richters 01-07-17  Other Topics Concern  . Not on file  Social History Narrative   Single- in a relationship. No children. 3 cats.    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   Hobbies: travel, reading, hiking   Feels safe in relationships   Social Determinants of Health   Financial Resource Strain:   . Difficulty of Paying Living Expenses:   Food Insecurity:   . Worried About Programme researcher, broadcasting/film/video in the Last Year:   . Barista in the Last Year:   Transportation Needs:   . Freight forwarder (Medical):   Marland Kitchen Lack of Transportation (Non-Medical):   Physical Activity:   . Days of Exercise  per Week:   . Minutes of Exercise per Session:   Stress:   . Feeling of Stress :   Social Connections:   . Frequency of Communication with Friends and Family:   . Frequency of Social Gatherings with Friends and Family:   . Attends Religious Services:   . Active Member of Clubs or Organizations:   . Attends Banker Meetings:   Marland Kitchen Marital Status:   Intimate Partner Violence:   . Fear of Current or Ex-Partner:   . Emotionally Abused:   Marland Kitchen Physically Abused:   . Sexually Abused:     Review of Systems  All other systems reviewed and are negative.   PHYSICAL EXAMINATION:    BP 120/82 (Cuff Size: Large)   Pulse 88   Ht 5' 5.5" (1.664 m)   Wt 258 lb (117 kg)   BMI 42.28 kg/m     General appearance: alert, cooperative and appears stated age   Pelvic: External genitalia:  no lesions              Urethra:  normal appearing urethra with no masses, tenderness or lesions              Bartholins and Skenes: normal                 Vagina: normal appearing vagina with normal color and discharge, no lesions              Cervix: no lesions.  Slightly bloody discharge.  IUD strings noted.                Bimanual Exam:  Uterus:  normal size, contour, position, consistency, mobility, non-tender              Adnexa: no mass, fullness, tenderness               Chaperone was present for exam.  ASSESSMENT  Kyleena IUD.  Vaginal pain.   PLAN  Affirm.  Return for continued discomfort.  Would consider pelvic US.  An After Visit Summary was printed and given to the patient.  __13____ minutes consultation.

## 2019-08-02 NOTE — Telephone Encounter (Signed)
-----   Message from Patton Salles, MD sent at 07/30/2019  8:10 AM EDT ----- Please contact patient in follow up to her test results.   The lab could not process testing for yeast and bacterial vaginosis due to an interfering substance.  Her testing was negative for gonorrhea, chlamydia and trichomonas.   If she is still having symptoms, I recommend she return for resting.

## 2019-08-02 NOTE — Telephone Encounter (Signed)
Spoke with pt. Pt given results and recommendations per Dr Edward Jolly. Pt agreeable. Pt states still having some urinary frequency, burning and aching. Pt advised can have retest. Pt agreeable. Pt scheduled with Dr Edward Jolly for retest of vaginal swab on 7/8 at 130 pm. Pt verbalized understanding of date and time of appt. CPS neg.   Routing to Dr Edward Jolly.  Encounter closed.

## 2019-08-03 ENCOUNTER — Ambulatory Visit: Payer: BC Managed Care – PPO | Admitting: Obstetrics and Gynecology

## 2019-08-03 ENCOUNTER — Ambulatory Visit: Payer: Self-pay | Admitting: Obstetrics and Gynecology

## 2019-08-03 ENCOUNTER — Other Ambulatory Visit: Payer: Self-pay

## 2019-08-03 ENCOUNTER — Encounter: Payer: Self-pay | Admitting: Obstetrics and Gynecology

## 2019-08-03 VITALS — BP 120/82 | HR 88 | Ht 65.5 in | Wt 258.0 lb

## 2019-08-03 DIAGNOSIS — R102 Pelvic and perineal pain: Secondary | ICD-10-CM

## 2019-08-04 LAB — VAGINITIS/VAGINOSIS, DNA PROBE
Candida Species: NEGATIVE
Gardnerella vaginalis: NEGATIVE
Trichomonas vaginosis: NEGATIVE

## 2019-09-07 ENCOUNTER — Encounter: Payer: Self-pay | Admitting: Podiatry

## 2019-09-07 ENCOUNTER — Other Ambulatory Visit: Payer: Self-pay

## 2019-09-07 ENCOUNTER — Ambulatory Visit: Payer: BC Managed Care – PPO | Admitting: Podiatry

## 2019-09-07 DIAGNOSIS — M778 Other enthesopathies, not elsewhere classified: Secondary | ICD-10-CM | POA: Diagnosis not present

## 2019-09-07 MED ORDER — METHYLPREDNISOLONE 4 MG PO TBPK: ORAL_TABLET | ORAL | 0 refills | Status: DC

## 2019-09-07 NOTE — Progress Notes (Signed)
She presents today for follow-up of capsulitis to the dorsal aspect of the left foot. States that this foot is still sore and hurts on the top between the fourth and fifth toes. But I am also having pain down the side as she points to the lateral aspect of the foot.  Objective: Vital signs are stable alert oriented x3. There is mild edema no erythema cellulitis drainage or odor she has pain on palpation of the fifth metatarsal head and fifth metatarsal base left. She has mild cavus foot deformity.  Assessment: Capsulitis forefoot fourth intermetatarsal space left.  Plan: At this point I injected the area today with 10 mg Kenalog 5 mg of Marcaine. Start her Medrol Dosepak followed by meloxicam.

## 2019-09-19 ENCOUNTER — Ambulatory Visit (INDEPENDENT_AMBULATORY_CARE_PROVIDER_SITE_OTHER): Payer: BC Managed Care – PPO | Admitting: Orthotics

## 2019-09-19 ENCOUNTER — Other Ambulatory Visit: Payer: Self-pay

## 2019-09-19 DIAGNOSIS — M778 Other enthesopathies, not elsewhere classified: Secondary | ICD-10-CM | POA: Diagnosis not present

## 2019-09-19 NOTE — Progress Notes (Signed)
Cast her today for a cmfo with valgus RF/FF wedge to take pressure off lateral aspect of foot and encourage proper toe off.

## 2019-09-19 NOTE — Addendum Note (Signed)
Addended by: Ria Clock on: 09/19/2019 05:14 PM   Modules accepted: Level of Service

## 2019-09-27 ENCOUNTER — Encounter: Payer: Self-pay | Admitting: Obstetrics and Gynecology

## 2019-09-27 ENCOUNTER — Ambulatory Visit: Payer: BC Managed Care – PPO | Admitting: Obstetrics and Gynecology

## 2019-09-27 ENCOUNTER — Other Ambulatory Visit: Payer: Self-pay

## 2019-09-27 VITALS — BP 122/82 | HR 88 | Resp 16 | Ht 66.0 in | Wt 257.0 lb

## 2019-09-27 DIAGNOSIS — Z113 Encounter for screening for infections with a predominantly sexual mode of transmission: Secondary | ICD-10-CM | POA: Diagnosis not present

## 2019-09-27 DIAGNOSIS — Z01419 Encounter for gynecological examination (general) (routine) without abnormal findings: Secondary | ICD-10-CM | POA: Diagnosis not present

## 2019-09-27 NOTE — Progress Notes (Signed)
34 y.o. G0P0000 Single Caucasian female here for annual exam.    No more vaginal pain.   LMP was a little more heavy than usual. A little bit of cramping.   Developing skin tags.   No partner change. Wants serum STD screening today.  She had negative GC/CT/trich testing on 07/27/19.  Went to Rheumatology, Dr. Orlin Hilding,  for evaluation of a red, swollen foot.  This has improved.   Received her Covid vaccine.   PCP: Felix Pacini, DO    Patient's last menstrual period was 09/13/2019 (approximate).     Period Cycle (Days):  (only occ. bleeding with Rutha Bouchard IUD)     Sexually active: Yes.    The current method of family planning is IUD--Kyleena 01-07-17.    Exercising: No.  trying to get into a routine--walking more Smoker:  no  Health Maintenance: Pap:  09-17-17 Neg:Neg HR HPV, 08-14-14 Neg:Neg HR HPV, 08-01-13 Neg History of abnormal Pap:  no MMG:  n/a Colonoscopy:  n/a BMD:   n/a  Result  n/a TDaP: 06-22-12 Gardasil:   yes HIV:09-21-18 NR Hep C:09-21-18 Neg Screening Labs:  PCP.    reports that she has never smoked. She has never used smokeless tobacco. She reports current alcohol use of about 5.0 - 6.0 standard drinks of alcohol per week. She reports that she does not use drugs.  Past Medical History:  Diagnosis Date  . Broken foot 08/2014   left  . Headache(784.0) 2016   migraine w/o aura  . Hyperlipidemia   . Low vitamin D level   . Migraines   . Plantar fasciitis of left foot   . Stress fracture of left foot 07/2015   confirmed with MRI    Past Surgical History:  Procedure Laterality Date  . INTRAUTERINE DEVICE (IUD) INSERTION     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/01/14  . INTRAUTERINE DEVICE INSERTION  11/2016   Kyleena     Current Outpatient Medications  Medication Sig Dispense Refill  . amitriptyline (ELAVIL) 50 MG tablet Take 1 tablet (50 mg total) by mouth at bedtime. 90 tablet 3  . Levonorgestrel (KYLEENA) 19.5 MG IUD by Intrauterine route.    . Multiple Vitamin  (MULTIVITAMIN WITH MINERALS) TABS tablet Take 1 tablet by mouth daily.     No current facility-administered medications for this visit.    Family History  Problem Relation Age of Onset  . Arthritis Mother   . Colitis Mother   . Lung cancer Maternal Grandmother        smoker  . Cancer Maternal Grandmother        kidney  . Leukemia Paternal Grandmother        unsure correct type of cancer  . Heart failure Paternal Grandmother   . Alzheimer's disease Maternal Grandfather   . Kidney failure Paternal Grandfather        ? dialysis  . Heart disease Maternal Aunt        MI 55-56    Review of Systems  All other systems reviewed and are negative.   Exam:   BP 122/82 (Cuff Size: Large)   Pulse 88   Resp 16   Ht 5\' 6"  (1.676 m)   Wt 257 lb (116.6 kg)   LMP 09/13/2019 (Approximate)   BMI 41.48 kg/m     General appearance: alert, cooperative and appears stated age Head: normocephalic, without obvious abnormality, atraumatic Neck: no adenopathy, supple, symmetrical, trachea midline and thyroid normal to inspection and palpation Lungs: clear to auscultation bilaterally  Breasts: normal appearance, no masses or tenderness, No nipple retraction or dimpling, No nipple discharge or bleeding, No axillary adenopathy Heart: regular rate and rhythm Abdomen: soft, non-tender; no masses, no organomegaly Extremities: extremities normal, atraumatic, no cyanosis or edema Skin: skin color, texture, turgor normal. No rashes or lesions Lymph nodes: cervical, supraclavicular, and axillary nodes normal. Neurologic: grossly normal  Pelvic: External genitalia:  no lesions              No abnormal inguinal nodes palpated.              Urethra:  normal appearing urethra with no masses, tenderness or lesions              Bartholins and Skenes: normal                 Vagina: normal appearing vagina with normal color and discharge, no lesions              Cervix: no lesions.  Friable.  IUD strings  noted.              Pap taken: No. Bimanual Exam:  Uterus:  normal size, contour, position, consistency, mobility, non-tender              Adnexa: no mass, fullness, tenderness        Chaperone was present for exam.  Assessment:   Well woman visit with normal exam. Kyleena IUD.  Migraine HA without aura.  On Elavil. Low vit D. STD screening.   Plan: Mammogram screening discussed. Self breast awareness reviewed. Pap and HR HPV 2024.  Guidelines for Calcium, Vitamin D, regular exercise program including cardiovascular and weight bearing exercise. Serum STD screening today. Routine labs with PCP.  She will call for recurrent pelvic pain.  Follow up annually and prn.

## 2019-09-27 NOTE — Patient Instructions (Signed)

## 2019-09-28 LAB — HEP, RPR, HIV PANEL
HIV Screen 4th Generation wRfx: NONREACTIVE
Hepatitis B Surface Ag: NEGATIVE
RPR Ser Ql: NONREACTIVE

## 2019-09-28 LAB — HEPATITIS C ANTIBODY: Hep C Virus Ab: <0.1 s/co ratio (ref 0.0–0.9)

## 2019-10-16 ENCOUNTER — Other Ambulatory Visit: Payer: BC Managed Care – PPO | Admitting: Orthotics

## 2019-10-24 ENCOUNTER — Other Ambulatory Visit: Payer: BC Managed Care – PPO | Admitting: Orthotics

## 2019-10-31 ENCOUNTER — Other Ambulatory Visit: Payer: Self-pay

## 2019-10-31 ENCOUNTER — Other Ambulatory Visit: Payer: BC Managed Care – PPO | Admitting: Orthotics

## 2019-11-01 ENCOUNTER — Encounter: Payer: BC Managed Care – PPO | Admitting: Family Medicine

## 2019-11-21 ENCOUNTER — Other Ambulatory Visit: Payer: Self-pay

## 2019-11-22 ENCOUNTER — Ambulatory Visit (INDEPENDENT_AMBULATORY_CARE_PROVIDER_SITE_OTHER): Payer: BC Managed Care – PPO | Admitting: Family Medicine

## 2019-11-22 ENCOUNTER — Encounter: Payer: Self-pay | Admitting: Family Medicine

## 2019-11-22 ENCOUNTER — Telehealth: Payer: Self-pay | Admitting: Family Medicine

## 2019-11-22 VITALS — BP 112/68 | HR 94 | Temp 98.2°F | Ht 66.25 in | Wt 256.0 lb

## 2019-11-22 DIAGNOSIS — Z79899 Other long term (current) drug therapy: Secondary | ICD-10-CM | POA: Diagnosis not present

## 2019-11-22 DIAGNOSIS — Z0000 Encounter for general adult medical examination without abnormal findings: Secondary | ICD-10-CM

## 2019-11-22 DIAGNOSIS — Z130 Encounter for screening for diseases of the blood and blood-forming organs and certain disorders involving the immune mechanism: Secondary | ICD-10-CM

## 2019-11-22 DIAGNOSIS — G43009 Migraine without aura, not intractable, without status migrainosus: Secondary | ICD-10-CM

## 2019-11-22 DIAGNOSIS — E785 Hyperlipidemia, unspecified: Secondary | ICD-10-CM | POA: Diagnosis not present

## 2019-11-22 DIAGNOSIS — E559 Vitamin D deficiency, unspecified: Secondary | ICD-10-CM

## 2019-11-22 DIAGNOSIS — Z131 Encounter for screening for diabetes mellitus: Secondary | ICD-10-CM | POA: Diagnosis not present

## 2019-11-22 DIAGNOSIS — Z6841 Body Mass Index (BMI) 40.0 and over, adult: Secondary | ICD-10-CM

## 2019-11-22 LAB — CBC WITH DIFFERENTIAL/PLATELET
Basophils Absolute: 0.2 10*3/uL — ABNORMAL HIGH (ref 0.0–0.1)
Basophils Relative: 2.6 % (ref 0.0–3.0)
Eosinophils Absolute: 0.1 10*3/uL (ref 0.0–0.7)
Eosinophils Relative: 1.4 % (ref 0.0–5.0)
HCT: 44 % (ref 36.0–46.0)
Hemoglobin: 15.1 g/dL — ABNORMAL HIGH (ref 12.0–15.0)
Lymphocytes Relative: 32.4 % (ref 12.0–46.0)
Lymphs Abs: 2.3 10*3/uL (ref 0.7–4.0)
MCHC: 34.4 g/dL (ref 30.0–36.0)
MCV: 93.3 fl (ref 78.0–100.0)
Monocytes Absolute: 0.6 10*3/uL (ref 0.1–1.0)
Monocytes Relative: 8.1 % (ref 3.0–12.0)
Neutro Abs: 4 10*3/uL (ref 1.4–7.7)
Neutrophils Relative %: 55.5 % (ref 43.0–77.0)
Platelets: 237 10*3/uL (ref 150.0–400.0)
RBC: 4.72 Mil/uL (ref 3.87–5.11)
RDW: 13.4 % (ref 11.5–15.5)
WBC: 7.1 10*3/uL (ref 4.0–10.5)

## 2019-11-22 LAB — TSH: TSH: 3.29 u[IU]/mL (ref 0.35–4.50)

## 2019-11-22 LAB — COMPREHENSIVE METABOLIC PANEL
ALT: 42 U/L — ABNORMAL HIGH (ref 0–35)
AST: 36 U/L (ref 0–37)
Albumin: 4.4 g/dL (ref 3.5–5.2)
Alkaline Phosphatase: 67 U/L (ref 39–117)
BUN: 12 mg/dL (ref 6–23)
CO2: 27 mEq/L (ref 19–32)
Calcium: 9.2 mg/dL (ref 8.4–10.5)
Chloride: 104 mEq/L (ref 96–112)
Creatinine, Ser: 0.71 mg/dL (ref 0.40–1.20)
GFR: 110.44 mL/min (ref >60.00)
Glucose, Bld: 87 mg/dL (ref 70–99)
Potassium: 4.1 mEq/L (ref 3.5–5.1)
Sodium: 139 mEq/L (ref 135–145)
Total Bilirubin: 0.8 mg/dL (ref 0.2–1.2)
Total Protein: 6.7 g/dL (ref 6.0–8.3)

## 2019-11-22 LAB — LIPID PANEL
Cholesterol: 170 mg/dL (ref 0–200)
HDL: 51.4 mg/dL (ref >39.00)
LDL Cholesterol: 82 mg/dL (ref 0–99)
NonHDL: 118.35
Total CHOL/HDL Ratio: 3
Triglycerides: 180 mg/dL — ABNORMAL HIGH (ref 0.0–149.0)
VLDL: 36 mg/dL (ref 0.0–40.0)

## 2019-11-22 LAB — VITAMIN D 25 HYDROXY (VIT D DEFICIENCY, FRACTURES): VITD: 11.59 ng/mL — ABNORMAL LOW (ref 30.00–100.00)

## 2019-11-22 LAB — HEMOGLOBIN A1C: Hgb A1c MFr Bld: 5 % (ref 4.6–6.5)

## 2019-11-22 MED ORDER — VITAMIN D (ERGOCALCIFEROL) 1.25 MG (50000 UNIT) PO CAPS: 50000.0000 [IU] | ORAL_CAPSULE | ORAL | 0 refills | Status: DC

## 2019-11-22 MED ORDER — ZOLMITRIPTAN 2.5 MG PO TBDP: 2.5000 mg | ORAL_TABLET | ORAL | 11 refills | Status: DC | PRN

## 2019-11-22 MED ORDER — AMITRIPTYLINE HCL 50 MG PO TABS: 50.0000 mg | ORAL_TABLET | Freq: Every day | ORAL | 3 refills | Status: DC

## 2019-11-22 NOTE — Telephone Encounter (Signed)
Please call patient Vit d is extremely low at 11.59. I have calle din the high dose once weekly VIT D. She should also start 2000u OTC vit d daily. Vit d is best absorbed when taken with food. She should plan to be on Vit d OTC even after high dose is completed to maintain levels.  kidney and thyroid function are normal Blood cell counts and electrolytes are normal Diabetes screening/A1c is normal at 5.0 Cholesterol panel looks good and at goal.  One of her liver enzymes, called ALT, is just mildly elevated at 42 (normal is <35). This level is not concerning, but should be monitored to ensure it does not continue to rise.    - recommend follow up in 12 weeks for recheck on liver and vit d levels. With provider.

## 2019-11-22 NOTE — Patient Instructions (Signed)
Health Maintenance, Female Adopting a healthy lifestyle and getting preventive care are important in promoting health and wellness. Ask your health care provider about:  The right schedule for you to have regular tests and exams.  Things you can do on your own to prevent diseases and keep yourself healthy. What should I know about diet, weight, and exercise? Eat a healthy diet   Eat a diet that includes plenty of vegetables, fruits, low-fat dairy products, and lean protein.  Do not eat a lot of foods that are high in solid fats, added sugars, or sodium. Maintain a healthy weight Body mass index (BMI) is used to identify weight problems. It estimates body fat based on height and weight. Your health care provider can help determine your BMI and help you achieve or maintain a healthy weight. Get regular exercise Get regular exercise. This is one of the most important things you can do for your health. Most adults should:  Exercise for at least 150 minutes each week. The exercise should increase your heart rate and make you sweat (moderate-intensity exercise).  Do strengthening exercises at least twice a week. This is in addition to the moderate-intensity exercise.  Spend less time sitting. Even light physical activity can be beneficial. Watch cholesterol and blood lipids Have your blood tested for lipids and cholesterol at 35 years of age, then have this test every 5 years. Have your cholesterol levels checked more often if:  Your lipid or cholesterol levels are high.  You are older than 35 years of age.  You are at high risk for heart disease. What should I know about cancer screening? Depending on your health history and family history, you may need to have cancer screening at various ages. This may include screening for:  Breast cancer.  Cervical cancer.  Colorectal cancer.  Skin cancer.  Lung cancer. What should I know about heart disease, diabetes, and high blood  pressure? Blood pressure and heart disease  High blood pressure causes heart disease and increases the risk of stroke. This is more likely to develop in people who have high blood pressure readings, are of African descent, or are overweight.  Have your blood pressure checked: ? Every 3-5 years if you are 18-39 years of age. ? Every year if you are 40 years old or older. Diabetes Have regular diabetes screenings. This checks your fasting blood sugar level. Have the screening done:  Once every three years after age 40 if you are at a normal weight and have a low risk for diabetes.  More often and at a younger age if you are overweight or have a high risk for diabetes. What should I know about preventing infection? Hepatitis B If you have a higher risk for hepatitis B, you should be screened for this virus. Talk with your health care provider to find out if you are at risk for hepatitis B infection. Hepatitis C Testing is recommended for:  Everyone born from 1945 through 1965.  Anyone with known risk factors for hepatitis C. Sexually transmitted infections (STIs)  Get screened for STIs, including gonorrhea and chlamydia, if: ? You are sexually active and are younger than 35 years of age. ? You are older than 35 years of age and your health care provider tells you that you are at risk for this type of infection. ? Your sexual activity has changed since you were last screened, and you are at increased risk for chlamydia or gonorrhea. Ask your health care provider if   you are at risk.  Ask your health care provider about whether you are at high risk for HIV. Your health care provider may recommend a prescription medicine to help prevent HIV infection. If you choose to take medicine to prevent HIV, you should first get tested for HIV. You should then be tested every 3 months for as long as you are taking the medicine. Pregnancy  If you are about to stop having your period (premenopausal) and  you may become pregnant, seek counseling before you get pregnant.  Take 400 to 800 micrograms (mcg) of folic acid every day if you become pregnant.  Ask for birth control (contraception) if you want to prevent pregnancy. Osteoporosis and menopause Osteoporosis is a disease in which the bones lose minerals and strength with aging. This can result in bone fractures. If you are 65 years old or older, or if you are at risk for osteoporosis and fractures, ask your health care provider if you should:  Be screened for bone loss.  Take a calcium or vitamin D supplement to lower your risk of fractures.  Be given hormone replacement therapy (HRT) to treat symptoms of menopause. Follow these instructions at home: Lifestyle  Do not use any products that contain nicotine or tobacco, such as cigarettes, e-cigarettes, and chewing tobacco. If you need help quitting, ask your health care provider.  Do not use street drugs.  Do not share needles.  Ask your health care provider for help if you need support or information about quitting drugs. Alcohol use  Do not drink alcohol if: ? Your health care provider tells you not to drink. ? You are pregnant, may be pregnant, or are planning to become pregnant.  If you drink alcohol: ? Limit how much you use to 0-1 drink a day. ? Limit intake if you are breastfeeding.  Be aware of how much alcohol is in your drink. In the [place] one drink equals one 12 oz bottle of beer (355 mL), one 5 oz glass of wine (148 mL), or one 1 oz glass of hard liquor (44 mL). General instructions  Schedule regular health, dental, and eye exams.  Stay current with your vaccines.  Tell your health care provider if: ? You often feel depressed. ? You have ever been abused or do not feel safe at home. Summary  Adopting a healthy lifestyle and getting preventive care are important in promoting health and wellness.  Follow your health care provider's instructions about healthy  diet, exercising, and getting tested or screened for diseases.  Follow your health care provider's instructions on monitoring your cholesterol and blood pressure. This information is not intended to replace advice given to you by your health care provider. Make sure you discuss any questions you have with your health care provider. Document Revised: 01/05/2018 Document Reviewed: 01/05/2018 Elsevier Patient Education  2020 Elsevier Inc.  

## 2019-11-22 NOTE — Progress Notes (Signed)
This visit occurred during the SARS-CoV-2 public health emergency.  Safety protocols were in place, including screening questions prior to the visit, additional usage of staff PPE, and extensive cleaning of exam room while observing appropriate contact time as indicated for disinfecting solutions.    Patient ID: Wendy Molina, female  DOB: [birth year], 34 y.o.   MRN: [id number] Patient Care Team    Relationship Specialty Notifications Start End  Natalia Leatherwood, DO PCP - General Family Medicine  02/11/15   Patton Salles, MD Consulting Physician Obstetrics and Gynecology  10/26/16     Chief Complaint  Patient presents with  . Annual Exam    pt is fasting     Subjective:  Wendy Molina is a 34 y.o.  Female  present for CPE. All past medical history, surgical history, allergies, family history, immunizations, medications and social history were updated in the electronic medical record today. All recent labs, ED visits and hospitalizations within the last year were reviewed.   Migraine Patient reportscompliancewith her amitriptyline 50 mg before bed. She is a little sad and stressed today - she had to put her cat to sleep last night.  She needs refills on meds today and has not routinely  needed any break through meds but would like refills on both medications today- amitrip and zomig.   Vit d def: Pt reports she forgot about vit d supplement and has not been taking it.   Health maintenance: Colonoscopy:No Fhx, screen at 20 Mammogram:No Fhx screen at 39 Cervical cancer screening: last pap: 08/2017,Dr. Silva,Normal  Immunizations: tdap2014 UTD, Influenza declined(encouraged yearly), covid series completed Infectious disease screening: HIV and Hep c completed DEXA:N/A Assistive device: none Oxygen ZJI:RCVE Patient has a Dental home. Hospitalizations/ED visits: reviewed  Depression screen [facility] 2/9 11/22/2019 10/31/2018 10/28/2017 10/26/2016 10/23/2015   Decreased Interest 0 0 0 0 0  Down, Depressed, Hopeless 0 0 0 0 0  PHQ - 2 Score 0 0 0 0 0   No flowsheet data found.   Immunization History  Administered Date(s) Administered  . HPV 9-valent 10/01/2017, 11/26/2017, 09/21/2018  . PFIZER SARS-COV-2 Vaccination 04/27/2019, 05/23/2019  . Tdap 06/22/2012     Past Medical History:  Diagnosis Date  . Broken foot 08/2014   left  . Headache(784.0) 2016   migraine w/o aura  . Hyperlipidemia   . Low vitamin D level   . Migraines   . Plantar fasciitis of left foot   . Stress fracture of left foot 07/2015   confirmed with MRI   No Known Allergies Past Surgical History:  Procedure Laterality Date  . INTRAUTERINE DEVICE (IUD) INSERTION     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/01/14  . INTRAUTERINE DEVICE INSERTION  11/2016   Kyleena    Family History  Problem Relation Age of Onset  . Arthritis Mother   . Colitis Mother   . Lung cancer Maternal Grandmother        smoker  . Cancer Maternal Grandmother        kidney  . Leukemia Paternal Grandmother        unsure correct type of cancer  . Heart failure Paternal Grandmother   . Alzheimer's disease Maternal Grandfather   . Kidney failure Paternal Grandfather        ? dialysis  . Heart disease Maternal Aunt        MI 52-56   Social History   Social History Narrative   Single- in a relationship. No children. 3 cats.  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   Hobbies: travel, reading, hiking   Feels safe in relationships    Allergies as of 11/22/2019   No Known Allergies     Medication List       Accurate as of November 22, 2019  9:58 AM. If you have any questions, ask your nurse or doctor.        amitriptyline 50 MG tablet Commonly known as: ELAVIL Take 1 tablet (50 mg total) by mouth at bedtime.   Kyleena 19.5 MG IUD Generic drug: levonorgestrel by Intrauterine route.   multivitamin with  minerals Tabs tablet Take 1 tablet by mouth daily.   zolmitriptan 2.5 MG disintegrating tablet Commonly known as: ZOMIG-ZMT Take 1 tablet (2.5 mg total) by mouth as needed for migraine (may take repeat dose 2 hours later x1). Started by: Felix Pacini, DO       All past medical history, surgical history, allergies, family history, immunizations andmedications were updated in the EMR today and reviewed under the history and medication portions of their EMR.      No results found.   ROS: 14 pt review of systems performed and negative (unless mentioned in an HPI)  Objective: BP 112/68   Pulse 94   Temp 98.2 F (36.8 C) (Oral)   Ht 5' 6.25" (1.683 m)   Wt 256 lb (116.1 kg)   SpO2 97%   BMI 41.01 kg/m  Gen: Afebrile. No acute distress. Nontoxic in appearance, well-developed, well-nourished,  Pleasant female.  HENT: AT. [place]. Bilateral TM visualized and normal in appearance, normal external auditory canal. MMM, no oral lesions, adequate dentition. Bilateral nares within normal limits. Throat without erythema, ulcerations or exudates. no Cough on exam, no hoarseness on exam. Eyes:Pupils Equal Round Reactive to light, Extraocular movements intact,  Conjunctiva without redness, discharge or icterus. Neck/lymp/endocrine: Supple,no lymphadenopathy, no thyromegaly CV: RRR no murmur, no edema, +2/4 P posterior tibialis pulses.  Chest: CTAB, no wheeze, rhonchi or crackles. normal Respiratory effort. good Air movement. Abd: Soft. obese. NTND. BS presentn. o Masses palpated. No hepatosplenomegaly. No rebound tenderness or guarding. Skin: no rashes, purpura or petechiae. Warm and well-perfused. Skin intact. Neuro/Msk:  Normal gait. PERLA. EOMi. Alert. Oriented x3.  Cranial nerves II through XII intact. Muscle strength 5/5 upper/lower extremity. DTRs equal bilaterally. Psych: Normal affect, dress and demeanor. Normal speech. Normal thought content and judgment.   No exam data  present  Assessment/plan: Wendy Molina is a 34 y.o. female present for CPE  Hyperlipidemia, unspecified hyperlipidemia type/obesity - Lipid panel - TSH  Vitamin D deficiency - Vitamin D (25 hydroxy)  Encounter for long-term current use of medication - Comprehensive metabolic panel  Screening for deficiency anemia - CBC with Differential/Platelet  Diabetes mellitus screening - Hemoglobin A1c Migraine without aura and without status migrainosus, not intractable - stable.  - continue  amitriptyline 50 mg nightly. - continue zomig PRN  Encounter for preventive health examination Patient was encouraged to exercise greater than 150 minutes a week. Patient was encouraged to choose a diet filled with fresh fruits and vegetables, and lean meats. AVS provided to patient today for education/recommendation on gender specific health and safety maintenance. Colonoscopy:No Fhx, screen at 24 Mammogram:No Fhx screen at 39 Cervical cancer screening: last pap: 08/2017,Dr. Silva,Normal  Immunizations: tdap2014 UTD, Influenza declined(encouraged yearly), covid series completed Infectious disease screening: HIV and Hep c completed DEXA:N/A  Return in about 1 year (around 11/21/2020) for CPE (30 min).  Orders Placed This Encounter  Procedures  . CBC with Differential/Platelet  . Comprehensive metabolic panel  . Hemoglobin A1c  . Lipid panel  . TSH  . Vitamin D (25 hydroxy)   Meds ordered this encounter  Medications  . amitriptyline (ELAVIL) 50 MG tablet    Sig: Take 1 tablet (50 mg total) by mouth at bedtime.    Dispense:  90 tablet    Refill:  3  . zolmitriptan (ZOMIG-ZMT) 2.5 MG disintegrating tablet    Sig: Take 1 tablet (2.5 mg total) by mouth as needed for migraine (may take repeat dose 2 hours later x1).    Dispense:  10 tablet    Refill:  11   Referral Orders  No referral(s) requested today     Electronically signed by: Felix Pacini, DO [facility]  [place]

## 2019-11-23 NOTE — Telephone Encounter (Signed)
Left detailed message for pt to call and schedule 12 wk f/u

## 2020-02-15 ENCOUNTER — Encounter: Payer: Self-pay | Admitting: Family Medicine

## 2020-02-15 ENCOUNTER — Ambulatory Visit: Payer: BC Managed Care – PPO | Admitting: Family Medicine

## 2020-02-15 ENCOUNTER — Other Ambulatory Visit: Payer: Self-pay

## 2020-02-15 VITALS — BP 115/87 | HR 96 | Temp 98.6°F | Ht 66.25 in | Wt 256.0 lb

## 2020-02-15 DIAGNOSIS — E559 Vitamin D deficiency, unspecified: Secondary | ICD-10-CM

## 2020-02-15 DIAGNOSIS — R7989 Other specified abnormal findings of blood chemistry: Secondary | ICD-10-CM | POA: Diagnosis not present

## 2020-02-15 LAB — COMPREHENSIVE METABOLIC PANEL
ALT: 22 U/L (ref 0–35)
AST: 19 U/L (ref 0–37)
Albumin: 4.4 g/dL (ref 3.5–5.2)
Alkaline Phosphatase: 61 U/L (ref 39–117)
BUN: 15 mg/dL (ref 6–23)
CO2: 25 mEq/L (ref 19–32)
Calcium: 9.4 mg/dL (ref 8.4–10.5)
Chloride: 104 mEq/L (ref 96–112)
Creatinine, Ser: 0.76 mg/dL (ref 0.40–1.20)
GFR: 101.61 mL/min (ref >60.00)
Glucose, Bld: 90 mg/dL (ref 70–99)
Potassium: 4.8 mEq/L (ref 3.5–5.1)
Sodium: 136 mEq/L (ref 135–145)
Total Bilirubin: 0.6 mg/dL (ref 0.2–1.2)
Total Protein: 7 g/dL (ref 6.0–8.3)

## 2020-02-15 LAB — VITAMIN D 25 HYDROXY (VIT D DEFICIENCY, FRACTURES): VITD: 22.12 ng/mL — ABNORMAL LOW (ref 30.00–100.00)

## 2020-02-15 NOTE — Progress Notes (Signed)
This visit occurred during the SARS-CoV-2 public health emergency.  Safety protocols were in place, including screening questions prior to the visit, additional usage of staff PPE, and extensive cleaning of exam room while observing appropriate contact time as indicated for disinfecting solutions.    Patient ID: Wendy Molina, female  DOB: 1985/06/15, 35 y.o.   MRN: [id number] Patient Care Team    Relationship Specialty Notifications Start End  Ma Hillock, DO PCP - General Family Medicine  02/11/15   Nunzio Cobbs, MD Consulting Physician Obstetrics and Gynecology  10/26/16     Chief Complaint  Patient presents with  . Follow-up    Pt is fasting     Subjective: Wendy Molina is a 35 y.o.  Female  present for vit d def and elevated lft Elevated LFT: incidental find of elevated ALT 42 on her physical 12 weeks ago. Asymptomatic. No recent med changes other than she does admit she started taking powdered tylenol more frequently. She does not routinely drink alcohol. Tg mildly elevated. Body mass index is 41.01 kg/m.  No fhx of liver disease or autoimmune disorders.   Vit d def: Completed high dose once weekly and taking MV.   Migraine (documentation only) Patient reportscompliancewith her amitriptyline 50 mg before bed. She is a little sad and stressed today - she had to put her cat to sleep last night.  She needs refills on meds today and has not routinely  needed any break through meds but would like refills on both medications today- amitrip and zomig.  Depression screen [facility] 2/9 11/22/2019 10/31/2018 10/28/2017 10/26/2016 10/23/2015  Decreased Interest 0 0 0 0 0  Down, Depressed, Hopeless 0 0 0 0 0  PHQ - 2 Score 0 0 0 0 0   No flowsheet data found.   Immunization History  Administered Date(s) Administered  . HPV 9-valent 10/01/2017, 11/26/2017, 09/21/2018  . PFIZER(Purple Top)SARS-COV-2 Vaccination 04/27/2019, 05/23/2019  . Tdap 06/22/2012     Past  Medical History:  Diagnosis Date  . Broken foot 08/2014   left  . Headache(784.0) 2016   migraine w/o aura  . Hyperlipidemia   . Low vitamin D level   . Migraines   . Plantar fasciitis of left foot   . Stress fracture of left foot 07/2015   confirmed with MRI   No Known Allergies Past Surgical History:  Procedure Laterality Date  . INTRAUTERINE DEVICE (IUD) INSERTION     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/01/14  . INTRAUTERINE DEVICE INSERTION  11/2016   Kyleena    Family History  Problem Relation Age of Onset  . Arthritis Mother   . Colitis Mother   . Lung cancer Maternal Grandmother        smoker  . Cancer Maternal Grandmother        kidney  . Leukemia Paternal Grandmother        unsure correct type of cancer  . Heart failure Paternal Grandmother   . Alzheimer's disease Maternal Grandfather   . Kidney failure Paternal Grandfather        ? dialysis  . Heart disease Maternal Aunt        MI 78-56   Social History   Social History Narrative   Single- in a relationship. No children. 3 cats.    Works for [employer]   Take a daily vitamin   Wears her seatbelt, smoke detector in the home.   Hobbies: travel, reading, hiking  Feels safe in relationships    Allergies as of 02/15/2020   No Known Allergies     Medication List       Accurate as of February 15, 2020  9:00 AM. If you have any questions, ask your nurse or doctor.        amitriptyline 50 MG tablet Commonly known as: ELAVIL Take 1 tablet (50 mg total) by mouth at bedtime.   levonorgestrel 19.5 MG IUD Commonly known as: KYLEENA by Intrauterine route.   multivitamin with minerals Tabs tablet Take 1 tablet by mouth daily.   Vitamin D (Ergocalciferol) 1.25 MG (50000 UNIT) Caps capsule Commonly known as: DRISDOL Take 1 capsule (50,000 Units total) by mouth every 7 (seven) days.   zolmitriptan 2.5 MG disintegrating tablet Commonly known as: ZOMIG-ZMT Take 1 tablet (2.5 mg total) by mouth as  needed for migraine (may take repeat dose 2 hours later x1).       All past medical history, surgical history, allergies, family history, immunizations andmedications were updated in the EMR today and reviewed under the history and medication portions of their EMR.      No results found.   ROS: 14 pt review of systems performed and negative (unless mentioned in an HPI)  Objective: BP 115/87   Pulse 96   Temp 98.6 F (37 C) (Oral)   Ht 5' 6.25" (1.683 m)   Wt 256 lb (116.1 kg)   SpO2 98%   BMI 41.01 kg/m  Gen: Afebrile. No acute distress. Obese female.  HENT: AT. [place]. No cough or hoarseness.  Eyes:Pupils Equal Round Reactive to light, Extraocular movements intact,  Conjunctiva without redness, discharge or icterus. Neck/lymp/endocrine: Supple,no lymphadenopathy CV: RRR, no edema Chest: CTAB, no wheeze or crackles Abd: Soft. obese. NTND. BS present. no Masses palpated. No HSM.  Skin: no rashes, purpura or petechiae.  Neuro:  Normal gait. PERLA. EOMi. Alert. Oriented x3 Psych: Normal affect, dress and demeanor. Normal speech. Normal thought content and judgment.   No exam data present  Assessment/plan: Wendy Molina is a 35 y.o. female present for CPE  Vitamin D deficiency - completed once weekly high dose and taking MV - Vitamin D (25 hydroxy)> once results received will guide on daily dosing vs rpt high dose.   Elevated LFTs Discussed common causes of LFT elevation with her today. She had been taking mor tylenol, obese and mildly elevated TG.  Diet and exercise modifications discussed.  Education on fatty liver disease provided - Comp Met (CMET) - consider abd [place], hep panel, HIV and AA work up if remains elevated.   (Documentation only) Migraine without aura and without status migrainosus, not intractable - stable.  - continue  amitriptyline 50 mg nightly. - continue zomig PRN    No follow-ups on file.  Orders Placed This Encounter  Procedures  .  Vitamin D (25 hydroxy)  . Comp Met (CMET)   No orders of the defined types were placed in this encounter.  Referral Orders  No referral(s) requested today     Electronically signed by: Howard Pouch, [place]

## 2020-02-15 NOTE — Patient Instructions (Addendum)
Hepatic steatosis = fatty liver disease.   Fatty Liver Disease  The liver converts food into energy, removes toxic material from the blood, makes important proteins, and absorbs necessary vitamins from food. Fatty liver disease occurs when too much fat has built up in your liver cells. Fatty liver disease is also called hepatic steatosis. In many cases, fatty liver disease does not cause symptoms or problems. It is often diagnosed when tests are being done for other reasons. However, over time, fatty liver can cause inflammation that may lead to more serious liver problems, such as scarring of the liver (cirrhosis) and liver failure. Fatty liver is associated with insulin resistance, increased body fat, high blood pressure (hypertension), and high cholesterol. These are features of metabolic syndrome and increase your risk for stroke, diabetes, and heart disease. What are the causes? This condition may be caused by components of metabolic syndrome:  Obesity.  Insulin resistance.  High cholesterol. Other causes:  Alcohol abuse.  Poor nutrition.  Cushing syndrome.  Pregnancy.  Certain drugs.  Poisons.  Some viral infections. What increases the risk? You are more likely to develop this condition if you:  Abuse alcohol.  Are overweight.  Have diabetes.  Have hepatitis.  Have a high triglyceride level.  Are pregnant. What are the signs or symptoms? Fatty liver disease often does not cause symptoms. If symptoms do develop, they can include:  Fatigue and weakness.  Weight loss.  Confusion.  Nausea, vomiting, or abdominal pain.  Yellowing of your skin and the white parts of your eyes (jaundice).  Itchy skin. How is this diagnosed? This condition may be diagnosed by:  A physical exam and your medical history.  Blood tests.  Imaging tests, such as an ultrasound, CT scan, or MRI.  A liver biopsy. A small sample of liver tissue is removed using a needle. The  sample is then looked at under a microscope. How is this treated? Fatty liver disease is often caused by other health conditions. Treatment for fatty liver may involve medicines and lifestyle changes to manage conditions such as:  Alcoholism.  High cholesterol.  Diabetes.  Being overweight or obese. Follow these instructions at home:  Do not drink alcohol. If you have trouble quitting, ask your health care provider how to safely quit with the help of medicine or a supervised program. This is important to keep your condition from getting worse.  Eat a healthy diet as told by your health care provider. Ask your health care provider about working with a dietitian to develop an eating plan.  Exercise regularly. This can help you lose weight and control your cholesterol and diabetes. Talk to your health care provider about an exercise plan and which activities are best for you.  Take over-the-counter and prescription medicines only as told by your health care provider.  Keep all follow-up visits. This is important.   Contact a health care provider if:  You have trouble controlling your: ? Blood sugar. This is especially important if you have diabetes. ? Cholesterol. ? Drinking of alcohol. Get help right away if:  You have abdominal pain.  You have jaundice.  You have nausea and are vomiting.  You vomit blood or material that looks like coffee grounds.  You have stools that are black, tar-like, or bloody. Summary  Fatty liver disease develops when too much fat builds up in the cells of your liver.  Fatty liver disease often causes no symptoms or problems. However, over time, fatty liver can cause  inflammation that may lead to more serious liver problems, such as scarring of the liver (cirrhosis).  You are more likely to develop this condition if you abuse alcohol, are pregnant, are overweight, have diabetes, have hepatitis, or have high triglyceride or cholesterol  levels.  Contact your health care provider if you have trouble controlling your blood sugar, cholesterol, or drinking of alcohol. This information is not intended to replace advice given to you by your health care provider. Make sure you discuss any questions you have with your health care provider. Document Revised: 10/26/2019 Document Reviewed: 10/26/2019 Elsevier Patient Education  2021 Elsevier Inc.   Vitamin D Deficiency Vitamin D deficiency is when your body does not have enough vitamin D. Vitamin D is important to your body because:  It helps your body use other minerals.  It helps to keep your bones strong and healthy.  It may help to prevent some diseases.  It helps your heart and other muscles work well. Not getting enough vitamin D can make your bones soft. It can also cause other health problems. What are the causes? This condition may be caused by:  Not eating enough foods that contain vitamin D.  Not getting enough sun.  Having diseases that make it hard for your body to absorb vitamin D.  Having a surgery in which a part of the stomach or a part of the small intestine is removed.  Having kidney disease or liver disease. What increases the risk? You are more likely to get this condition if:  You are older.  You do not spend much time outdoors.  You live in a nursing home.  You have had broken bones.  You have weak or thin bones (osteoporosis).  You have a disease or condition that changes how your body absorbs vitamin D.  You have dark skin.  You take certain medicines.  You are overweight or obese. What are the signs or symptoms?  In mild cases, there may not be any symptoms. If the condition is very bad, symptoms may include: ? Bone pain. ? Muscle pain. ? Falling often. ? Broken bones caused by a minor injury. How is this treated? Treatment may include taking supplements as told by your doctor. Your doctor will tell you what dose is best for  you. Supplements may include:  Vitamin D.  Calcium. Follow these instructions at home: Eating and drinking  Eat foods that contain vitamin D, such as: ? Dairy products, cereals, or juices with added vitamin D. Check the label. ? Fish, such as salmon or trout. ? Eggs. ? Oysters. ? Mushrooms. The items listed above may not be a complete list of what you can eat and drink. Contact a dietitian for more options.   General instructions  Take medicines and supplements only as told by your doctor.  Get regular, safe exposure to natural sunlight.  Do not use a tanning bed.  Maintain a healthy weight. Lose weight if needed.  Keep all follow-up visits as told by your doctor. This is important. How is this prevented?  You can get vitamin D by: ? Eating foods that naturally contain vitamin D. ? Eating or drinking products that have vitamin D added to them, such as cereals, juices, and milk. ? Taking vitamin D or a multivitamin that contains vitamin D. ? Being in the sun. Your body makes vitamin D when your skin is exposed to sunlight. Your body changes the sunlight into a form of the vitamin that it can  use. Contact a doctor if:  Your symptoms do not go away.  You feel sick to your stomach (nauseous).  You throw up (vomit).  You poop less often than normal, or you have trouble pooping (constipation). Summary  Vitamin D deficiency is when your body does not have enough vitamin D.  Vitamin D helps to keep your bones strong and healthy.  This condition is often treated by taking a supplement.  Your doctor will tell you what dose is best for you. This information is not intended to replace advice given to you by your health care provider. Make sure you discuss any questions you have with your health care provider. Document Revised: 09/20/2017 Document Reviewed: 09/20/2017 Elsevier Patient Education  2021 [employer].

## 2020-05-28 ENCOUNTER — Other Ambulatory Visit: Payer: Self-pay

## 2020-05-28 ENCOUNTER — Ambulatory Visit: Payer: BC Managed Care – PPO | Admitting: Podiatry

## 2020-05-28 ENCOUNTER — Encounter: Payer: Self-pay | Admitting: Podiatry

## 2020-05-28 DIAGNOSIS — M722 Plantar fascial fibromatosis: Secondary | ICD-10-CM

## 2020-05-28 DIAGNOSIS — M778 Other enthesopathies, not elsewhere classified: Secondary | ICD-10-CM

## 2020-05-28 MED ORDER — MELOXICAM 15 MG PO TABS: 15.0000 mg | ORAL_TABLET | Freq: Every day | ORAL | 3 refills | Status: DC

## 2020-05-28 MED ORDER — TRIAMCINOLONE ACETONIDE 40 MG/ML IJ SUSP
40.0000 mg | Freq: Once | INTRAMUSCULAR | Status: AC
  Administered 2020-05-28: 40 mg

## 2020-05-28 MED ORDER — METHYLPREDNISOLONE 4 MG PO TBPK: ORAL_TABLET | ORAL | 0 refills | Status: DC

## 2020-05-28 NOTE — Progress Notes (Signed)
She presents today after having not seen her since August of last year states that I am still sore but is more back and here she points to the fourth fifth TMT joint as well as the plantar fascial area of her left foot.  Objective: Vital signs are stable alert oriented x3 she has pain on palpation range of motion of the fourth fifth TMT joint.  She also has pain on palpation medial calcaneal tubercle of the left heel.  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  Plan: At this point I injected both sites today plantar fascial and capsulitis with 10 mg Kenalog 5 mg Marcaine for maximal tenderness.  Refilled her medication and I will follow-up with her in the near future.

## 2020-06-10 NOTE — Progress Notes (Signed)
GYNECOLOGY  VISIT  CC:   Irregular bleeding, pelvic pain. Symptoms started beginning of May  HPI: 35 y.o. G0P0000 Single White or Caucasian female here for pelvic cramping.  Period Duration (Days):  (usually 3 days, last cycle lasted 5-6) Period Pattern: Regular Menstrual Flow:  (usually light flow, this time heavy flow) Menstrual Control: Panty liner,Maxi pad Dysmenorrhea: (!) Severe  Has Kyleena IUD and noticed last period had cramping before period, cramping during period, heavy bleeding (not her usual, because has been light). Pain has been constant "7-8" but if when her mind is off it, can focus on other things.  Period ended 5 days ago and now having cramping again, started bleeding again today.  Denies signs of bladder infection, denies dysuria, urgency, frequency  Denies changes in bowel habits, usually goes everyday, soft stool  Still has same partner, no concerns of STD  GYNECOLOGIC HISTORY: Patient's last menstrual period was 06/01/2020. Contraception: 12-04-16 Rutha Bouchard was inserted   Patient Active Problem List   Diagnosis Date Noted  . Elevated LFTs 02/15/2020  . Hyperlipidemia   . Encounter for long-term current use of medication 10/23/2015  . Obesity 10/23/2015  . Vitamin D deficiency 02/12/2015  . Migraine without aura     Past Medical History:  Diagnosis Date  . Broken foot 08/2014   left  . Headache(784.0) 2016   migraine w/o aura  . Hyperlipidemia   . Low vitamin D level   . Migraines   . Plantar fasciitis of left foot   . Stress fracture of left foot 07/2015   confirmed with MRI    Past Surgical History:  Procedure Laterality Date  . INTRAUTERINE DEVICE (IUD) INSERTION     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/01/14  . INTRAUTERINE DEVICE INSERTION  12/04/2016   Kyleena     MEDS:   Current Outpatient Medications on File Prior to Visit  Medication Sig Dispense Refill  . amitriptyline (ELAVIL) 50 MG tablet Take 1 tablet (50 mg total) by mouth at bedtime. 90  tablet 3  . levonorgestrel (KYLEENA) 19.5 MG IUD by Intrauterine route. Inserted 12-04-16    . Multiple Vitamin (MULTIVITAMIN WITH MINERALS) TABS tablet Take 1 tablet by mouth daily.    Marland Kitchen zolmitriptan (ZOMIG-ZMT) 2.5 MG disintegrating tablet Take 1 tablet (2.5 mg total) by mouth as needed for migraine (may take repeat dose 2 hours later x1). 10 tablet 11  . meloxicam (MOBIC) 15 MG tablet Take 1 tablet (15 mg total) by mouth daily. (Patient not taking: Reported on 06/11/2020) 30 tablet 3  . methylPREDNISolone (MEDROL DOSEPAK) 4 MG TBPK tablet 6 day dose pack - take as directed (Patient not taking: Reported on 06/11/2020) 21 tablet 0   No current facility-administered medications on file prior to visit.    ALLERGIES: Patient has no known allergies.  Family History  Problem Relation Age of Onset  . Arthritis Mother   . Colitis Mother   . Lung cancer Maternal Grandmother        smoker  . Cancer Maternal Grandmother        kidney  . Leukemia Paternal Grandmother        unsure correct type of cancer  . Heart failure Paternal Grandmother   . Alzheimer's disease Maternal Grandfather   . Kidney failure Paternal Grandfather        ? dialysis  . Heart disease Maternal Aunt        MI 55-56     Review of Systems  Constitutional: Negative.   HENT: Negative.  Eyes: Negative.   Respiratory: Negative.   Cardiovascular: Negative.   Gastrointestinal: Negative.   Endocrine: Negative.   Genitourinary:       Pelvic cramping for 3 weeks  Musculoskeletal: Negative.   Skin: Negative.   Allergic/Immunologic: Negative.   Neurological: Negative.   Hematological: Negative.   Psychiatric/Behavioral: Negative.     PHYSICAL EXAMINATION:    BP 114/72   Pulse 93   Temp 97.9 F (36.6 C) (Oral)   Resp 16   Wt 258 lb (117 kg)   LMP 06/01/2020 Comment: started having bleeding today also  BMI 41.33 kg/m     General appearance: alert, cooperative, no acute distress  Abdomen: soft,  no masses,  no  organomegaly, mild tenderness with deep palpation mid to left lower abdomen Lymph:  no inguinal LAD noted  Pelvic: External genitalia:  no lesions              Urethra:  normal appearing urethra with no masses, tenderness or lesions              Bartholins and Skenes: normal                 Vagina: normal appearing vagina with small amount bloody discharge              Cervix: no cervical motion tenderness, no lesions and IUD strings visible              Bimanual Exam:  Uterus:  normal size and shape, mild tenderness with deep pressure              Adnexa: Right adnexa non-tender, mild tenderness Left side, unable to palpate difinitely              UPT neg  Chaperone, Joy, CMA, was present for exam.  Assessment: Pelvic pain - Plan: CBC w/Diff, Pregnancy, urine  Contraceptive, surveillance, intrauterine device-reassured that strings look normal  Irregular bleeding - Plan: norelgestromin-ethinyl estradiol Burr Medico) 150-35 MCG/24HR transdermal patch  Discussed pain medication options, pt unable to swallow pills, she may take Tylenol as it comes in powder form. Encouraged heating pad. Will try Xulane for ovarian suppression and improve bleeding (pt has been on before and did fine. Encouraged to use continuous until follow up.  F/U 6 weeks for re-evaluation Pelvic US to be scheduled in 7 weeks (Can always cancel if feeling better) Encouraged to call sooner if fever, increased pain, abnormal discharge or anything else intensifies.   25 minutes of total time was spent for this patient encounter, including preparation, face-to-face counseling with the patient and coordination of care, and documentation of the encounter.

## 2020-06-11 ENCOUNTER — Encounter: Payer: Self-pay | Admitting: Nurse Practitioner

## 2020-06-11 ENCOUNTER — Ambulatory Visit: Payer: BC Managed Care – PPO | Admitting: Nurse Practitioner

## 2020-06-11 ENCOUNTER — Other Ambulatory Visit: Payer: Self-pay

## 2020-06-11 VITALS — BP 114/72 | HR 93 | Temp 97.9°F | Resp 16 | Wt 258.0 lb

## 2020-06-11 DIAGNOSIS — R102 Pelvic and perineal pain: Secondary | ICD-10-CM

## 2020-06-11 DIAGNOSIS — N926 Irregular menstruation, unspecified: Secondary | ICD-10-CM

## 2020-06-11 DIAGNOSIS — Z30431 Encounter for routine checking of intrauterine contraceptive device: Secondary | ICD-10-CM

## 2020-06-11 LAB — CBC WITH DIFFERENTIAL/PLATELET
Absolute Monocytes: 672 cells/uL (ref 200–950)
Basophils Absolute: 86 cells/uL (ref 0–200)
Basophils Relative: 0.9 %
Eosinophils Absolute: 58 cells/uL (ref 15–500)
Eosinophils Relative: 0.6 %
HCT: 47 % — ABNORMAL HIGH (ref 35.0–45.0)
Hemoglobin: 15.5 g/dL (ref 11.7–15.5)
Lymphs Abs: 2602 cells/uL (ref 850–3900)
MCH: 31 pg (ref 27.0–33.0)
MCHC: 33 g/dL (ref 32.0–36.0)
MCV: 94 fL (ref 80.0–100.0)
MPV: 10.8 fL (ref 7.5–12.5)
Monocytes Relative: 7 %
Neutro Abs: 6182 cells/uL (ref 1500–7800)
Neutrophils Relative %: 64.4 %
Platelets: 266 10*3/uL (ref 140–400)
RBC: 5 10*6/uL (ref 3.80–5.10)
RDW: 12.2 % (ref 11.0–15.0)
Total Lymphocyte: 27.1 %
WBC: 9.6 10*3/uL (ref 3.8–10.8)

## 2020-06-11 LAB — PREGNANCY, URINE: Preg Test, Ur: NEGATIVE

## 2020-06-11 MED ORDER — XULANE 150-35 MCG/24HR TD PTWK: 1.0000 | MEDICATED_PATCH | TRANSDERMAL | 1 refills | Status: DC

## 2020-06-11 NOTE — Patient Instructions (Signed)
Pelvic Pain, Female Pelvic pain is pain in your lower abdomen, below your belly button and between your hips. The pain may start suddenly (be acute), keep coming back (be recurring), or last a long time (become chronic). Pelvic pain that lasts longer than 6 months is considered chronic. Pelvic pain may affect your:  Reproductive organs.  Urinary system.  Digestive tract.  Musculoskeletal system. There are many potential causes of pelvic pain. Sometimes, the pain can be a result of digestive or urinary conditions, strained muscles or ligaments, or reproductive conditions. Sometimes the cause of pelvic pain is not known. Follow these instructions at home:  Take over-the-counter and prescription medicines only as told by your health care provider.  Rest as told by your health care provider.  Do not have sex if it hurts.  Keep a journal of your pelvic pain. Write down: ? When the pain started. ? Where the pain is located. ? What seems to make the pain better or worse, such as food or your period (menstrual cycle). ? Any symptoms you have along with the pain.  Keep all follow-up visits as told by your health care provider. This is important.   Contact a health care provider if:  Medicine does not help your pain.  Your pain comes back.  You have new symptoms.  You have abnormal vaginal discharge or bleeding, including bleeding after menopause.  You have a fever or chills.  You are constipated.  You have blood in your urine or stool.  You have foul-smelling urine.  You feel weak or light-headed. Get help right away if:  You have sudden severe pain.  Your pain gets steadily worse.  You have severe pain along with fever, nausea, vomiting, or excessive sweating.  You lose consciousness. Summary  Pelvic pain is pain in your lower abdomen, below your belly button and between your hips.  There are many potential causes of pelvic pain.  Keep a journal of your pelvic  pain. This information is not intended to replace advice given to you by your health care provider. Make sure you discuss any questions you have with your health care provider. Document Revised: 06/30/2017 Document Reviewed: 06/30/2017 Elsevier Patient Education  2021 Elsevier Inc.  

## 2020-07-02 ENCOUNTER — Other Ambulatory Visit: Payer: Self-pay

## 2020-07-02 ENCOUNTER — Ambulatory Visit: Payer: BC Managed Care – PPO | Admitting: Podiatry

## 2020-07-02 DIAGNOSIS — M722 Plantar fascial fibromatosis: Secondary | ICD-10-CM

## 2020-07-02 MED ORDER — TRIAMCINOLONE ACETONIDE 40 MG/ML IJ SUSP
40.0000 mg | Freq: Once | INTRAMUSCULAR | Status: AC
  Administered 2020-07-02: 40 mg

## 2020-07-03 NOTE — Progress Notes (Signed)
She presents today stating that she has some improvement in the left heel since the last time she was here she goes on to say that she has some discomfort in the center aspect of the right heel at this time and that is began last Thursday.  She states that sometimes it is a sharp stabbing type pain.  She states that her left heel is about 30% improved.  Objective: Vital signs are stable she is alert oriented x3 she has pain on palpation medial calcaneal tubercles bilateral pulses remain palpable and strong.  Assessment: Plan fasciitis bilateral.  Plan: Injected both heels today placed her in plantar fascial brace x2 and she will continue her current medications.  I would like to make sure she continues her appropriate shoe gear and I will follow-up with her in 1 month

## 2020-07-13 ENCOUNTER — Other Ambulatory Visit: Payer: Self-pay | Admitting: Nurse Practitioner

## 2020-07-13 DIAGNOSIS — N926 Irregular menstruation, unspecified: Secondary | ICD-10-CM

## 2020-07-15 NOTE — Telephone Encounter (Signed)
Ultrasound scheduled on 07/30/20

## 2020-07-23 ENCOUNTER — Ambulatory Visit: Payer: BC Managed Care – PPO | Admitting: Nurse Practitioner

## 2020-07-30 ENCOUNTER — Ambulatory Visit: Payer: BC Managed Care – PPO | Admitting: Obstetrics and Gynecology

## 2020-07-30 ENCOUNTER — Other Ambulatory Visit: Payer: Self-pay

## 2020-07-30 ENCOUNTER — Ambulatory Visit (INDEPENDENT_AMBULATORY_CARE_PROVIDER_SITE_OTHER): Payer: BC Managed Care – PPO

## 2020-07-30 VITALS — BP 148/82

## 2020-07-30 DIAGNOSIS — N926 Irregular menstruation, unspecified: Secondary | ICD-10-CM

## 2020-07-30 DIAGNOSIS — Z30431 Encounter for routine checking of intrauterine contraceptive device: Secondary | ICD-10-CM | POA: Diagnosis not present

## 2020-07-30 DIAGNOSIS — R102 Pelvic and perineal pain: Secondary | ICD-10-CM

## 2020-07-30 MED ORDER — XULANE 150-35 MCG/24HR TD PTWK: MEDICATED_PATCH | TRANSDERMAL | 0 refills | Status: DC

## 2020-07-30 NOTE — Progress Notes (Signed)
GYNECOLOGY  VISIT   HPI: 35 y.o.   Single  Caucasian  female   G0P0000 with No LMP recorded (lmp unknown). (Menstrual status: IUD).   here for transvaginal ultrasound and follow up from visit 06/11/2020.  Seen for pelvic cramping with Kyleena IUD.  Was having constant bleeding and pain that was constant, dull or throbbing.  Pain more on the left side.   No partner change.  Neg GC/Chlamydia/trich testing 07/27/19.   No constipation.  No dysuria.   Started OrthoEvra to treat the pain.  Used for 6 weeks and constant bleeding stopped but had spotting. Last patch was removed around 07/20/20.  Did have post coital bleeding.  Pain not improved but she is not sure if she is getting used to it.  Even missed work one day a couple of weeks ago. Using heating pad and taking warm baths.  Using Tylenol dissolve packet and liquid Motrin for children as she has problems taking pills.   GYNECOLOGIC HISTORY: No LMP recorded (lmp unknown). (Menstrual status: IUD). Contraception:  Rutha Bouchard, 12/04/16 Menopausal hormone therapy:  none Last mammogram:  none Last pap smear:   09/17/2017        OB History     Gravida  0   Para  0   Term  0   Preterm  0   AB  0   Living  0      SAB  0   IAB  0   Ectopic  0   Multiple  0   Live Births  0              Patient Active Problem List   Diagnosis Date Noted   Elevated LFTs 02/15/2020   Hyperlipidemia    Encounter for long-term current use of medication 10/23/2015   Obesity 10/23/2015   Vitamin D deficiency 02/12/2015   Migraine without aura     Past Medical History:  Diagnosis Date   Broken foot 08/2014   left   Headache(784.0) 2016   migraine w/o aura   Hyperlipidemia    Low vitamin D level    Migraines    Plantar fasciitis of left foot    Stress fracture of left foot 07/2015   confirmed with MRI    Past Surgical History:  Procedure Laterality Date   INTRAUTERINE DEVICE (IUD) INSERTION     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/01/14    INTRAUTERINE DEVICE INSERTION  12/04/2016   Kyleena     Current Outpatient Medications  Medication Sig Dispense Refill   amitriptyline (ELAVIL) 50 MG tablet Take 1 tablet (50 mg total) by mouth at bedtime. 90 tablet 3   levonorgestrel (KYLEENA) 19.5 MG IUD by Intrauterine route. Inserted 12-04-16     meloxicam (MOBIC) 15 MG tablet Take 1 tablet (15 mg total) by mouth daily. 30 tablet 3   Multiple Vitamin (MULTIVITAMIN WITH MINERALS) TABS tablet Take 1 tablet by mouth daily.     XULANE 150-35 MCG/24HR transdermal patch PLACE 1 PATCH ONTO THE SKIN ONCE A WEEK 3 patch 1   zolmitriptan (ZOMIG-ZMT) 2.5 MG disintegrating tablet Take 1 tablet (2.5 mg total) by mouth as needed for migraine (may take repeat dose 2 hours later x1). 10 tablet 11   No current facility-administered medications for this visit.     ALLERGIES: Patient has no known allergies.  Family History  Problem Relation Age of Onset   Arthritis Mother    Colitis Mother    Lung cancer Maternal Grandmother  smoker   Cancer Maternal Grandmother        kidney   Leukemia Paternal Grandmother        unsure correct type of cancer   Heart failure Paternal Grandmother    Alzheimer's disease Maternal Grandfather    Kidney failure Paternal Grandfather        ? dialysis   Heart disease Maternal Aunt        MI 68-56    Social History   Socioeconomic History   Marital status: Single    Spouse name: Not on file   Number of children: 0   Years of education: Not on file   Highest education level: Not on file  Occupational History   Occupation: mortgage insurance  Tobacco Use   Smoking status: Never   Smokeless tobacco: Never  Vaping Use   Vaping Use: Never used  Substance and Sexual Activity   Alcohol use: Yes    Alcohol/week: 5.0 - 6.0 standard drinks    Types: 5 - 6 Standard drinks or equivalent per week   Drug use: No   Sexual activity: Yes    Partners: Male    Birth control/protection: I.U.D.    Comment:  kyleena 12-04-16  Other Topics Concern   Not on file  Social History Narrative   Single- in a relationship. No children. 3 cats.    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   Hobbies: travel, reading, hiking   Feels safe in relationships   Social Determinants of Health   Financial Resource Strain: Not on file  Food Insecurity: Not on file  Transportation Needs: Not on file  Physical Activity: Not on file  Stress: Not on file  Social Connections: Not on file  Intimate Partner Violence: Not on file    Review of Systems  PHYSICAL EXAMINATION:    BP (!) 148/82 (BP Location: Right Arm, Patient Position: Sitting, Cuff Size: Large)   LMP  (LMP Unknown) Comment: has lighting spotting/bleeding with wiping    General appearance: alert, cooperative and appears stated age  Pelvic US  Uterus 7.54 x 4.33 x 4.51 cm.  EMS 4.27.  IUD in endometrial canal.  Ovaries normal with normal perfusion.  No adnexal masses.  No free fluid.   ASSESSMENT  Pelvic pain.  Kyleena IUD check up. Irregular bleeding.   PLAN  Pelvic US findings and report reviewed.  Limitations of [place] to detect endometriosis/fibrosis discussed.  Options for care reviewed:  Continue OrthoEvra continuously until annual exam is due, switch to Mirena IUD, switch to Depo Provera.  She will continue with OrthoEvra continuously until annual exam in September, 2022.   29 min total time was spent for this patient encounter, including preparation, face-to-face counseling with the patient, coordination of care, and documentation of the encounter.

## 2020-07-31 ENCOUNTER — Encounter: Payer: Self-pay | Admitting: Obstetrics and Gynecology

## 2020-08-01 ENCOUNTER — Ambulatory Visit: Payer: BC Managed Care – PPO | Admitting: Podiatry

## 2020-08-20 ENCOUNTER — Ambulatory Visit: Payer: BC Managed Care – PPO | Admitting: Podiatry

## 2020-09-03 ENCOUNTER — Ambulatory Visit: Payer: BC Managed Care – PPO | Admitting: Podiatry

## 2020-09-03 ENCOUNTER — Encounter: Payer: Self-pay | Admitting: Podiatry

## 2020-09-03 ENCOUNTER — Other Ambulatory Visit: Payer: Self-pay

## 2020-09-03 DIAGNOSIS — M778 Other enthesopathies, not elsewhere classified: Secondary | ICD-10-CM | POA: Diagnosis not present

## 2020-09-03 DIAGNOSIS — M722 Plantar fascial fibromatosis: Secondary | ICD-10-CM

## 2020-09-03 MED ORDER — TRIAMCINOLONE ACETONIDE 40 MG/ML IJ SUSP
20.0000 mg | Freq: Once | INTRAMUSCULAR | Status: AC
  Administered 2020-09-03: 20 mg

## 2020-09-04 NOTE — Progress Notes (Signed)
She presents today states that her heels are 95% improved and continue to improve all the time.  On the left foot I have noticed is having pain on the top as she refers to the fourth intermetatarsal space proximally.  Objective: Vital signs are stable is alert oriented x3 moderately edematous today pulses remain palpable not tenderness on palpation medial calcaneal tubercles bilateral.  She has tenderness on palpation of the proximal fourth intermetatarsal space.  Assessment: Well-healing Planter fasciitis bilateral 95% and improving.  Intermetatarsal ligament sprain fourth intermetatarsal space left foot.  Plan: Encouraged her to continue all conservative therapies that have helped alleviate the Planter fasciitis.  Also injected the fourth intermetatarsal space today and I will follow-up with her as needed.

## 2020-10-01 ENCOUNTER — Ambulatory Visit (INDEPENDENT_AMBULATORY_CARE_PROVIDER_SITE_OTHER): Payer: BC Managed Care – PPO | Admitting: Obstetrics and Gynecology

## 2020-10-01 ENCOUNTER — Other Ambulatory Visit: Payer: Self-pay

## 2020-10-01 ENCOUNTER — Encounter: Payer: Self-pay | Admitting: Obstetrics and Gynecology

## 2020-10-01 ENCOUNTER — Other Ambulatory Visit (HOSPITAL_COMMUNITY)
Admission: RE | Admit: 2020-10-01 | Discharge: 2020-10-01 | Disposition: A | Discharge status: Home / Self Care | Payer: BC Managed Care – PPO | Source: Ambulatory Visit | Attending: Obstetrics and Gynecology | Admitting: Obstetrics and Gynecology

## 2020-10-01 ENCOUNTER — Other Ambulatory Visit: Payer: Self-pay | Admitting: *Deleted

## 2020-10-01 VITALS — BP 118/72 | HR 102 | Ht 66.5 in | Wt 257.0 lb

## 2020-10-01 DIAGNOSIS — R7989 Other specified abnormal findings of blood chemistry: Secondary | ICD-10-CM

## 2020-10-01 DIAGNOSIS — Z113 Encounter for screening for infections with a predominantly sexual mode of transmission: Secondary | ICD-10-CM | POA: Insufficient documentation

## 2020-10-01 DIAGNOSIS — R300 Dysuria: Secondary | ICD-10-CM

## 2020-10-01 DIAGNOSIS — Z01419 Encounter for gynecological examination (general) (routine) without abnormal findings: Secondary | ICD-10-CM

## 2020-10-01 MED ORDER — SULFAMETHOXAZOLE-TRIMETHOPRIM 800-160 MG PO TABS: 1.0000 | ORAL_TABLET | Freq: Two times a day (BID) | ORAL | 0 refills | Status: DC

## 2020-10-01 MED ORDER — PHENAZOPYRIDINE HCL 200 MG PO TABS: 200.0000 mg | ORAL_TABLET | Freq: Three times a day (TID) | ORAL | 0 refills | Status: DC | PRN

## 2020-10-01 NOTE — Telephone Encounter (Signed)
Per Dr. Edward Jolly  Please contact patient with her urine microscopic results.  She was seen earlier today in the office with dysuria.    It looks like a potential UTI.    I am recommending Bactrim DS po bid x 3 days.  #6, RF none.    OK for AZO 200 mg po tid prn dysuria.  #10, RF none.   I will let her know when her final urine culture returns.

## 2020-10-01 NOTE — Patient Instructions (Signed)

## 2020-10-01 NOTE — Progress Notes (Signed)
35 y.o. G0P0000 Single Caucasian female here for annual exam.    Had normal pelvic US 07/30/20 for pelvic pain.  IUD in normal position.  She did a trial of OrthoEvra to treat pain and irregular bleeding and used it for 6 weeks.  She did not continue after this.  Pain is better.  Bleeding has improved.  Bled second week of August for 2 days and then 2 weeks alter for 2 days.  She thinks she has a UTI.   Not currently on increased vit D.   PCP:  Felix Pacini, DO    Patient's last menstrual period was 09/16/2020 (exact date).     Period Cycle (Days):  (irregular bleeding with Rutha Bouchard)     Sexually active: Yes.    The current method of family planning is IUD--Kyleena  01/07/17.    Exercising: Yes.    Trying to walk more Smoker:  no  Health Maintenance: Pap: 09-17-17 Neg:Neg HR HPV, 08-14-14 Neg:Neg HR HPV, 08-01-13 Neg History of abnormal Pap:  no MMG:  n/a Colonoscopy:  n/a BMD:   n/a  Result  n/a TDaP:  06-22-12 Gardasil:   yes HIV:09-21-18 NR Hep C:09-21-18 Neg Screening Labs: today.   reports that she has never smoked. She has never used smokeless tobacco. She reports current alcohol use of about 5.0 - 6.0 standard drinks per week. She reports that she does not use drugs.  Past Medical History:  Diagnosis Date   Broken foot 08/2014   left   Headache(784.0) 2016   migraine w/o aura   Hyperlipidemia    Low vitamin D level    Migraines    Plantar fasciitis of left foot    Stress fracture of left foot 07/2015   confirmed with MRI    Past Surgical History:  Procedure Laterality Date   INTRAUTERINE DEVICE (IUD) INSERTION     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/01/14   INTRAUTERINE DEVICE INSERTION  12/04/2016   Kyleena     Current Outpatient Medications  Medication Sig Dispense Refill   amitriptyline (ELAVIL) 50 MG tablet Take 1 tablet (50 mg total) by mouth at bedtime. 90 tablet 3   levonorgestrel (KYLEENA) 19.5 MG IUD by Intrauterine route. Inserted 12-04-16     meloxicam (MOBIC) 15 MG  tablet Take 1 tablet (15 mg total) by mouth daily. 30 tablet 3   Multiple Vitamin (MULTIVITAMIN WITH MINERALS) TABS tablet Take 1 tablet by mouth daily.     zolmitriptan (ZOMIG-ZMT) 2.5 MG disintegrating tablet Take 1 tablet (2.5 mg total) by mouth as needed for migraine (may take repeat dose 2 hours later x1). 10 tablet 11   No current facility-administered medications for this visit.    Family History  Problem Relation Age of Onset   Arthritis Mother    Colitis Mother    Lung cancer Maternal Grandmother        smoker   Cancer Maternal Grandmother        kidney   Leukemia Paternal Grandmother        unsure correct type of cancer   Heart failure Paternal Grandmother    Alzheimer's disease Maternal Grandfather    Kidney failure Paternal Grandfather        ? dialysis   Heart disease Maternal Aunt        MI 65-56    Review of Systems  Genitourinary:  Positive for dysuria and urgency.  All other systems reviewed and are negative.  Exam:   BP 118/72   Pulse (!) 102  Ht 5' 6.5" (1.689 m)   Wt 257 lb (116.6 kg)   LMP 09/16/2020 (Exact Date)   SpO2 100%   BMI 40.86 kg/m     General appearance: alert, cooperative and appears stated age Head: normocephalic, without obvious abnormality, atraumatic Neck: no adenopathy, supple, symmetrical, trachea midline and thyroid normal to inspection and palpation Lungs: clear to auscultation bilaterally Breasts: normal appearance, no masses or tenderness, bilateral nipple inversion (old change) No nipple discharge or bleeding, No axillary adenopathy Heart: regular rate and rhythm Abdomen: soft, non-tender; no masses, no organomegaly Extremities: extremities normal, atraumatic, no cyanosis or edema Skin: skin color, texture, turgor normal. No rashes or lesions Lymph nodes: cervical, supraclavicular, and axillary nodes normal. Neurologic: grossly normal  Pelvic: External genitalia:  no lesions              No abnormal inguinal nodes  palpated.              Urethra:  normal appearing urethra with no masses, tenderness or lesions              Bartholins and Skenes: normal                 Vagina: normal appearing vagina with normal color and discharge, no lesions              Cervix: no lesions.  IUD strings noted.              Pap taken: no Bimanual Exam:  Uterus:  normal size, contour, position, consistency, mobility, non-tender              Adnexa: no mass, fullness, tenderness                 Chaperone was present for exam:  Marchelle Folks, CMA  Assessment:   Well woman visit with gynecologic exam. Kyleena IUD.  Migraine HA without aura.  On Elavil. Bilateral nipple inversion.  Low vit D. Dysuria.   Plan: Mammogram screening discussed. Self breast awareness reviewed. Pap and HR HPV as above. Guidelines for Calcium, Vitamin D, regular exercise program including cardiovascular and weight bearing exercise. STD screening, routine labs.  We discussed potential switch to a Mirena IUD if desired.  Urinalysis and reflex culture.  Follow up annually and prn.

## 2020-10-02 LAB — CBC
HCT: 46.7 % — ABNORMAL HIGH (ref 35.0–45.0)
Hemoglobin: 15.4 g/dL (ref 11.7–15.5)
MCH: 31.6 pg (ref 27.0–33.0)
MCHC: 33 g/dL (ref 32.0–36.0)
MCV: 95.7 fL (ref 80.0–100.0)
MPV: 11 fL (ref 7.5–12.5)
Platelets: 259 10*3/uL (ref 140–400)
RBC: 4.88 10*6/uL (ref 3.80–5.10)
RDW: 12.6 % (ref 11.0–15.0)
WBC: 9.6 10*3/uL (ref 3.8–10.8)

## 2020-10-02 LAB — LIPID PANEL
Cholesterol: 169 mg/dL (ref <200)
HDL: 61 mg/dL (ref >=50)
LDL Cholesterol (Calc): 91 mg/dL (calc)
Non-HDL Cholesterol (Calc): 108 mg/dL (calc) (ref <130)
Total CHOL/HDL Ratio: 2.8 (calc) (ref <5.0)
Triglycerides: 84 mg/dL (ref <150)

## 2020-10-02 LAB — COMPREHENSIVE METABOLIC PANEL
AG Ratio: 1.6 (calc) (ref 1.0–2.5)
ALT: 19 U/L (ref 6–29)
AST: 18 U/L (ref 10–30)
Albumin: 4.5 g/dL (ref 3.6–5.1)
Alkaline phosphatase (APISO): 67 U/L (ref 31–125)
BUN: 14 mg/dL (ref 7–25)
CO2: 24 mmol/L (ref 20–32)
Calcium: 9.1 mg/dL (ref 8.6–10.2)
Chloride: 104 mmol/L (ref 98–110)
Creat: 0.71 mg/dL (ref 0.50–0.97)
Globulin: 2.8 g/dL (calc) (ref 1.9–3.7)
Glucose, Bld: 91 mg/dL (ref 65–99)
Potassium: 4.2 mmol/L (ref 3.5–5.3)
Sodium: 138 mmol/L (ref 135–146)
Total Bilirubin: 0.5 mg/dL (ref 0.2–1.2)
Total Protein: 7.3 g/dL (ref 6.1–8.1)

## 2020-10-02 LAB — VITAMIN D 25 HYDROXY (VIT D DEFICIENCY, FRACTURES): Vit D, 25-Hydroxy: 20 ng/mL — ABNORMAL LOW (ref 30–100)

## 2020-10-02 LAB — TSH: TSH: 1.96 mIU/L

## 2020-10-02 LAB — HEPATITIS C ANTIBODY
Hepatitis C Ab: NONREACTIVE
SIGNAL TO CUT-OFF: 0.01 (ref <1.00)

## 2020-10-02 LAB — HEPATITIS B SURFACE ANTIGEN: Hepatitis B Surface Ag: NONREACTIVE

## 2020-10-02 LAB — HIV ANTIBODY (ROUTINE TESTING W REFLEX): HIV 1&2 Ab, 4th Generation: NONREACTIVE

## 2020-10-02 LAB — RPR: RPR Ser Ql: NONREACTIVE

## 2020-10-03 ENCOUNTER — Ambulatory Visit: Payer: BC Managed Care – PPO | Admitting: Obstetrics and Gynecology

## 2020-10-03 LAB — CERVICOVAGINAL ANCILLARY ONLY
Chlamydia: NEGATIVE
Comment: NEGATIVE
Comment: NEGATIVE
Comment: NORMAL
Neisseria Gonorrhea: NEGATIVE
Trichomonas: NEGATIVE

## 2020-10-04 LAB — URINE CULTURE
MICRO NUMBER:: 12334882
SPECIMEN QUALITY:: ADEQUATE

## 2020-10-04 LAB — URINALYSIS, COMPLETE W/RFL CULTURE
Bilirubin Urine: NEGATIVE
Glucose, UA: NEGATIVE
Hyaline Cast: NONE SEEN /LPF
Ketones, ur: NEGATIVE
Nitrites, Initial: NEGATIVE
Specific Gravity, Urine: 1.025 (ref 1.001–1.035)
pH: 5.5 (ref 5.0–8.0)

## 2020-10-04 LAB — CULTURE INDICATED

## 2020-10-05 NOTE — Addendum Note (Signed)
Addended by: Ardell Isaacs, Debbe Bales E on: 10/05/2020 09:26 AM   Modules accepted: Orders

## 2020-10-22 ENCOUNTER — Other Ambulatory Visit: Payer: Self-pay

## 2020-10-22 ENCOUNTER — Encounter: Payer: Self-pay | Admitting: Podiatry

## 2020-10-22 ENCOUNTER — Ambulatory Visit: Payer: BC Managed Care – PPO | Admitting: Podiatry

## 2020-10-22 DIAGNOSIS — M722 Plantar fascial fibromatosis: Secondary | ICD-10-CM

## 2020-10-22 DIAGNOSIS — M778 Other enthesopathies, not elsewhere classified: Secondary | ICD-10-CM | POA: Diagnosis not present

## 2020-10-22 MED ORDER — TRIAMCINOLONE ACETONIDE 40 MG/ML IJ SUSP
40.0000 mg | Freq: Once | INTRAMUSCULAR | Status: AC
  Administered 2020-10-22: 40 mg

## 2020-10-22 NOTE — Progress Notes (Signed)
She presents today before she leaves for her international flight to [place] states that her right heel is doing great her left heel is 95% better.  States that she still having some tenderness to the fourth intermetatarsal space proximally left foot.  Objective: Vital signs are stable alert oriented x3.  Pulses are palpable.  No pain on palpation medial calcaneal tubercle right but there is some on the left.  It is not warm to the touch is not hard on palpation.  She still has some tenderness though not nearly as painful on palpation of the proximal fourth intermetatarsal space and she has no pain at the fourth fifth tarsometatarsal junction.  Assessment: Sprain of the fourth intermetatarsal space most likely compensatory to Planter fasciitis chronic in nature.  Plan: I injected these areas today 20 mg Kenalog filled 5 mg Marcaine to the point of maximal tenderness of the left heel.  10 mg to the intermetatarsal space.  I like to follow-up with her in 1 month when she has returned from [place] and [place].

## 2020-10-29 ENCOUNTER — Ambulatory Visit: Payer: BC Managed Care – PPO | Admitting: Podiatry

## 2020-11-06 ENCOUNTER — Encounter: Payer: Self-pay | Admitting: Family Medicine

## 2020-11-06 ENCOUNTER — Other Ambulatory Visit: Payer: Self-pay

## 2020-11-06 ENCOUNTER — Telehealth: Payer: BC Managed Care – PPO | Admitting: Family Medicine

## 2020-11-06 DIAGNOSIS — J019 Acute sinusitis, unspecified: Secondary | ICD-10-CM

## 2020-11-06 DIAGNOSIS — B9689 Other specified bacterial agents as the cause of diseases classified elsewhere: Secondary | ICD-10-CM | POA: Diagnosis not present

## 2020-11-06 DIAGNOSIS — R051 Acute cough: Secondary | ICD-10-CM

## 2020-11-06 MED ORDER — DOXYCYCLINE HYCLATE 100 MG PO TABS: 100.0000 mg | ORAL_TABLET | Freq: Two times a day (BID) | ORAL | 0 refills | Status: DC

## 2020-11-06 MED ORDER — BENZONATATE 200 MG PO CAPS: 200.0000 mg | ORAL_CAPSULE | Freq: Two times a day (BID) | ORAL | 0 refills | Status: DC | PRN

## 2020-11-06 MED ORDER — HYDROCODONE BIT-HOMATROP MBR 5-1.5 MG/5ML PO SOLN: 5.0000 mL | Freq: Three times a day (TID) | ORAL | 0 refills | Status: DC | PRN

## 2020-11-06 NOTE — Patient Instructions (Signed)

## 2020-11-06 NOTE — Progress Notes (Signed)
VIRTUAL VISIT VIA VIDEO  I connected with [name] on 11/06/20 at  2:30 PM EDT by elemedicine application and verified that I am speaking with the correct person using two identifiers. Location patient: Home Location provider: [facility], Office Persons participating in the virtual visit: Patient, Dr. Claiborne Billings and Reece Agar. Cesar, CMA  I discussed the limitations of evaluation and management by telemedicine and the availability of in person appointments. The patient expressed understanding and agreed to proceed.   SUBJECTIVE Chief Complaint  Patient presents with   Cough   Headache   Nasal Congestion    HPI: Wendy Molina is a 35 y.o. female present for acute illness.  Patient reports she has had a dry cough, frontal sinus headache nasal congestion with runny nose for approximately 8-9 days.  Onset was during her trip to [place] in [place].  She has taken 2 COVID tests which were negative.  She reports the cough seems to be worse at night and is keeping her awake.  She denies any fevers, chills, nausea or vomit.  She denies any shortness of breath or wheezing.  She has tried DayQuil, NyQuil and Robitussin none of which seems to be helpful.  ROS: See pertinent positives and negatives per HPI.  Patient Active Problem List   Diagnosis Date Noted   Elevated LFTs 02/15/2020   Hyperlipidemia    Encounter for long-term current use of medication 10/23/2015   Obesity 10/23/2015   Vitamin D deficiency 02/12/2015   Migraine without aura     Social History   Tobacco Use   Smoking status: Never   Smokeless tobacco: Never  Substance Use Topics   Alcohol use: Yes    Alcohol/week: 5.0 - 6.0 standard drinks    Types: 5 - 6 Standard drinks or equivalent per week    Current Outpatient Medications:    amitriptyline (ELAVIL) 50 MG tablet, Take 1 tablet (50 mg total) by mouth at bedtime., Disp: 90 tablet, Rfl: 3   benzonatate (TESSALON) 200 MG capsule, Take 1 capsule (200 mg  total) by mouth 2 (two) times daily as needed for cough., Disp: 20 capsule, Rfl: 0   doxycycline (VIBRA-TABS) 100 MG tablet, Take 1 tablet (100 mg total) by mouth 2 (two) times daily., Disp: 20 tablet, Rfl: 0   HYDROcodone bit-homatropine (HYCODAN) 5-1.5 MG/5ML syrup, Take 5 mLs by mouth every 8 (eight) hours as needed for cough., Disp: 120 mL, Rfl: 0   levonorgestrel (KYLEENA) 19.5 MG IUD, by Intrauterine route. Inserted 12-04-16, Disp: , Rfl:    meloxicam (MOBIC) 15 MG tablet, Take 1 tablet (15 mg total) by mouth daily., Disp: 30 tablet, Rfl: 3   Multiple Vitamin (MULTIVITAMIN WITH MINERALS) TABS tablet, Take 1 tablet by mouth daily., Disp: , Rfl:    zolmitriptan (ZOMIG-ZMT) 2.5 MG disintegrating tablet, Take 1 tablet (2.5 mg total) by mouth as needed for migraine (may take repeat dose 2 hours later x1)., Disp: 10 tablet, Rfl: 11  No Known Allergies  OBJECTIVE: There were no vitals taken for this visit. Gen: No acute distress. Nontoxic in appearance.  HENT: AT. [place].  MMM.  Eyes:Pupils Equal Round Reactive to light, Extraocular movements intact,  Conjunctiva without redness, discharge or icterus. Chest: Cough, shortness of breath present on exam.  Mild hoarseness present on exam. Skin: No rashes, purpura or petechiae.  Neuro:  Alert. Oriented x3  Psych: Normal affect and demeanor. Normal speech. Normal thought content and judgment.  ASSESSMENT AND PLAN: [name] is  a 35 y.o. female present for  Acute bacterial sinusitis/cough Rest, hydrate.  +/- flonase, mucinex (DM if cough), nettie pot or nasal saline.  Doxycycline prescribed, take until completed.  Tessalon Perles daily.  And Hycodan cough syrup nightly If cough present it can last up to 6-8 weeks.  F/U 2 weeks if not improved.    Felix Pacini, DO 11/06/2020   Return in about 2 weeks (around 11/20/2020), or if symptoms worsen or fail to improve.  No orders of the defined types were placed in this encounter.  Meds  ordered this encounter  Medications   doxycycline (VIBRA-TABS) 100 MG tablet    Sig: Take 1 tablet (100 mg total) by mouth 2 (two) times daily.    Dispense:  20 tablet    Refill:  0   benzonatate (TESSALON) 200 MG capsule    Sig: Take 1 capsule (200 mg total) by mouth 2 (two) times daily as needed for cough.    Dispense:  20 capsule    Refill:  0   HYDROcodone bit-homatropine (HYCODAN) 5-1.5 MG/5ML syrup    Sig: Take 5 mLs by mouth every 8 (eight) hours as needed for cough.    Dispense:  120 mL    Refill:  0   Referral Orders  No referral(s) requested today

## 2020-11-21 ENCOUNTER — Ambulatory Visit: Payer: BC Managed Care – PPO | Admitting: Podiatry

## 2020-11-22 ENCOUNTER — Encounter: Payer: Self-pay | Admitting: Family Medicine

## 2020-11-22 ENCOUNTER — Ambulatory Visit (INDEPENDENT_AMBULATORY_CARE_PROVIDER_SITE_OTHER): Payer: BC Managed Care – PPO | Admitting: Family Medicine

## 2020-11-22 ENCOUNTER — Telehealth: Payer: Self-pay | Admitting: Family Medicine

## 2020-11-22 ENCOUNTER — Other Ambulatory Visit: Payer: Self-pay

## 2020-11-22 VITALS — BP 121/82 | HR 87 | Temp 97.6°F | Ht 66.5 in | Wt 259.0 lb

## 2020-11-22 DIAGNOSIS — G43709 Chronic migraine without aura, not intractable, without status migrainosus: Secondary | ICD-10-CM | POA: Diagnosis not present

## 2020-11-22 DIAGNOSIS — Z6841 Body Mass Index (BMI) 40.0 and over, adult: Secondary | ICD-10-CM

## 2020-11-22 DIAGNOSIS — E559 Vitamin D deficiency, unspecified: Secondary | ICD-10-CM

## 2020-11-22 DIAGNOSIS — Z131 Encounter for screening for diabetes mellitus: Secondary | ICD-10-CM

## 2020-11-22 DIAGNOSIS — E782 Mixed hyperlipidemia: Secondary | ICD-10-CM | POA: Diagnosis not present

## 2020-11-22 DIAGNOSIS — Z79899 Other long term (current) drug therapy: Secondary | ICD-10-CM

## 2020-11-22 DIAGNOSIS — Z0000 Encounter for general adult medical examination without abnormal findings: Secondary | ICD-10-CM | POA: Diagnosis not present

## 2020-11-22 MED ORDER — AMITRIPTYLINE HCL 50 MG PO TABS: 50.0000 mg | ORAL_TABLET | Freq: Every day | ORAL | 3 refills | Status: DC

## 2020-11-22 MED ORDER — ZOLMITRIPTAN 2.5 MG PO TBDP: 2.5000 mg | ORAL_TABLET | ORAL | 11 refills | Status: DC | PRN

## 2020-11-22 NOTE — Telephone Encounter (Signed)
Noted  

## 2020-11-22 NOTE — Progress Notes (Signed)
This visit occurred during the SARS-CoV-2 public health emergency.  Safety protocols were in place, including screening questions prior to the visit, additional usage of staff PPE, and extensive cleaning of exam room while observing appropriate contact time as indicated for disinfecting solutions.    Patient ID: Wendy Molina, female  DOB: 19-Apr-1985, 35 y.o.   MRN: [id number] Patient Care Team    Relationship Specialty Notifications Start End  Natalia Leatherwood, DO PCP - General Family Medicine  02/11/15   Patton Salles, MD Consulting Physician Obstetrics and Gynecology  10/26/16     Chief Complaint  Patient presents with   Annual Exam    Pt is fasting    Subjective:  Wendy Molina is a 35 y.o.  Female  present for CPE/CMC All past medical history, surgical history, allergies, family history, immunizations, medications and social history were updated in the electronic medical record today. All recent labs, ED visits and hospitalizations within the last year were reviewed.  Migraine Patient reports compliance  with her amitriptyline 50 mg before bed. She needs refills on meds today and has not routinely  needed any break through meds but would like refills on both medications today- amitrip and zomig.    Vit d def: Pt reports she is taking vit d daily now. Vit d 20 at gyn last month.    Health maintenance: Colonoscopy: No Fhx, screen at 44 Mammogram: No Fhx screen at 39 Cervical cancer screening: last pap: 09/2020,  Dr. Edward Jolly, Normal - records requested Immunizations: tdap 2014 UTD, Influenza declined(encouraged yearly), covid series completed Infectious disease screening: HIV and Hep c completed DEXA:N/A Assistive device: none Oxygen ZTI:WPYK Patient has a Dental home. Hospitalizations/ED visits: reviewed   Depression screen [facility] 2/9 11/22/2020 11/22/2019 10/31/2018 10/28/2017 10/26/2016  Decreased Interest 0 0 0 0 0  Down, Depressed, Hopeless 0 0 0 0 0   PHQ - 2 Score 0 0 0 0 0   No flowsheet data found.   Immunization History  Administered Date(s) Administered   HPV 9-valent 10/01/2017, 11/26/2017, 09/21/2018   PFIZER(Purple Top)SARS-COV-2 Vaccination 04/27/2019, 05/23/2019, 06/27/2020   Tdap 06/22/2012    Past Medical History:  Diagnosis Date   Broken foot 08/2014   left   Headache(784.0) 2016   migraine w/o aura   Hyperlipidemia    Low vitamin D level    Migraines    Plantar fasciitis of left foot    Stress fracture of left foot 07/2015   confirmed with MRI   No Known Allergies Past Surgical History:  Procedure Laterality Date   INTRAUTERINE DEVICE (IUD) INSERTION     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/01/14   INTRAUTERINE DEVICE INSERTION  12/04/2016   Kyleena    Family History  Problem Relation Age of Onset   Arthritis Mother    Colitis Mother    Hemachromatosis Brother    Heart disease Maternal Aunt        MI 55-56   Lung cancer Maternal Grandmother        smoker   Cancer Maternal Grandmother        kidney   Alzheimer's disease Maternal Grandfather    Leukemia Paternal Grandmother        unsure correct type of cancer   Heart failure Paternal Grandmother    Kidney failure Paternal Grandfather        ? dialysis   Social History   Social History Narrative   Single- in a relationship. No children. 3 cats.  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   Hobbies: travel, reading, hiking   Feels safe in relationships    Allergies as of 11/22/2020   No Known Allergies      Medication List        Accurate as of November 22, 2020  9:40 AM. If you have any questions, ask your nurse or doctor.          STOP taking these medications    benzonatate 200 MG capsule Commonly known as: TESSALON Stopped by: Felix Pacini, DO   doxycycline 100 MG tablet Commonly known as: VIBRA-TABS Stopped by: Felix Pacini, DO   HYDROcodone  bit-homatropine 5-1.5 MG/5ML syrup Commonly known as: HYCODAN Stopped by: Felix Pacini, DO       TAKE these medications    amitriptyline 50 MG tablet Commonly known as: ELAVIL Take 1 tablet (50 mg total) by mouth at bedtime.   levonorgestrel 19.5 MG IUD Commonly known as: KYLEENA by Intrauterine route. Inserted 12-04-16   meloxicam 15 MG tablet Commonly known as: MOBIC Take 1 tablet (15 mg total) by mouth daily.   multivitamin with minerals Tabs tablet Take 1 tablet by mouth daily.   zolmitriptan 2.5 MG disintegrating tablet Commonly known as: ZOMIG-ZMT Take 1 tablet (2.5 mg total) by mouth as needed for migraine (may take repeat dose 2 hours later x1).        All past medical history, surgical history, allergies, family history, immunizations andmedications were updated in the EMR today and reviewed under the history and medication portions of their EMR.      No results found.   ROS: 14 pt review of systems performed and negative (unless mentioned in an HPI)  Objective: BP 121/82   Pulse 87   Temp 97.6 F (36.4 C) (Oral)   Ht 5' 6.5" (1.689 m)   Wt 259 lb (117.5 kg)   LMP 10/28/2020   SpO2 96%   BMI 41.18 kg/m  Gen: Afebrile. No acute distress. Nontoxic in appearance, well-developed, well-nourished,  very pleasant obese female.  HENT: AT. [place]. Bilateral TM visualized and normal in appearance, normal external auditory canal. MMM, no oral lesions, adequate dentition. Bilateral nares within normal limits. Throat without erythema, ulcerations or exudates. no Cough on exam, no hoarseness on exam. Eyes:Pupils Equal Round Reactive to light, Extraocular movements intact,  Conjunctiva without redness, discharge or icterus. Neck/lymp/endocrine: Supple,no lymphadenopathy, no thyromegaly CV: RRR no murmur, no edema, +2/4 P posterior tibialis pulses.  Chest: CTAB, no wheeze, rhonchi or crackles. normal Respiratory effort. good Air movement. Abd: Soft. flat. NTND. BS  present. no Masses palpated. No hepatosplenomegaly. No rebound tenderness or guarding. Skin: no rashes, purpura or petechiae. Warm and well-perfused. Skin intact. Neuro/Msk: Normal gait. PERLA. EOMi. Alert. Oriented x3.  Cranial nerves II through XII intact. Muscle strength 5/5 upper/lower extremity. DTRs equal bilaterally. Psych: Normal affect, dress and demeanor. Normal speech. Normal thought content and judgment.  No results found.  Assessment/plan: Wendy Molina is a 35 y.o. female present for CPE/CMC Class 3 severe obesity due to excess calories without serious comorbidity with body mass index (BMI) of 40.0 to 44.9 in adult (HCC)/Mixed hyperlipidemia Lipids look great. Reviewed  labs with her from GYN appt 4 weeks ago.  Vitamin D deficiency Reviewed recent labs at GYN Encouraged her to make sure she is supplementing with 1000u vit d daily.  Chronic migraine w/o aura w/o  status migrainosus, not intractable Stable.  Continue amitrip 50 qhs and zomig prn Routine general medical examination at a health care facility Colonoscopy: No Fhx, screen at 44 Mammogram: No Fhx screen at 40 Cervical cancer screening: last pap: 09/2020,  Dr. Edward Jolly, Normal - records requested Immunizations: tdap 2014 UTD, Influenza declined(encouraged yearly), covid series completed Infectious disease screening: HIV and Hep c completed DEXA:N/A Patient was encouraged to exercise greater than 150 minutes a week. Patient was encouraged to choose a diet filled with fresh fruits and vegetables, and lean meats. AVS provided to patient today for education/recommendation on gender specific health and safety maintenance.   Return in about 1 year (around 11/24/2021).  No orders of the defined types were placed in this encounter.   No orders of the defined types were placed in this encounter.  Meds ordered this encounter  Medications   zolmitriptan (ZOMIG-ZMT) 2.5 MG disintegrating tablet    Sig: Take 1 tablet  (2.5 mg total) by mouth as needed for migraine (may take repeat dose 2 hours later x1).    Dispense:  10 tablet    Refill:  11   amitriptyline (ELAVIL) 50 MG tablet    Sig: Take 1 tablet (50 mg total) by mouth at bedtime.    Dispense:  90 tablet    Refill:  3    Referral Orders  No referral(s) requested today     Electronically signed by: Felix Pacini, DO [facility] [place]

## 2020-11-22 NOTE — Patient Instructions (Signed)
Great to see you today.  I have refilled the medication(s) we provide.   If labs were collected, we will inform you of lab results once received either by echart message or telephone call.   - echart message- for normal results that have been seen by the patient already.   - telephone call: abnormal results or if patient has not viewed results in their echart. Health Maintenance, Female Adopting a healthy lifestyle and getting preventive care are important in promoting health and wellness. Ask your health care provider about: The right schedule for you to have regular tests and exams. Things you can do on your own to prevent diseases and keep yourself healthy. What should I know about diet, weight, and exercise? Eat a healthy diet  Eat a diet that includes plenty of vegetables, fruits, low-fat dairy products, and lean protein. Do not eat a lot of foods that are high in solid fats, added sugars, or sodium. Maintain a healthy weight Body mass index (BMI) is used to identify weight problems. It estimates body fat based on height and weight. Your health care provider can help determine your BMI and help you achieve or maintain a healthy weight. Get regular exercise Get regular exercise. This is one of the most important things you can do for your health. Most adults should: Exercise for at least 150 minutes each week. The exercise should increase your heart rate and make you sweat (moderate-intensity exercise). Do strengthening exercises at least twice a week. This is in addition to the moderate-intensity exercise. Spend less time sitting. Even light physical activity can be beneficial. Watch cholesterol and blood lipids Have your blood tested for lipids and cholesterol at 36 years of age, then have this test every 5 years. Have your cholesterol levels checked more often if: Your lipid or cholesterol levels are high. You are older than 36 years of age. You are at high risk for heart  disease. What should I know about cancer screening? Depending on your health history and family history, you may need to have cancer screening at various ages. This may include screening for: Breast cancer. Cervical cancer. Colorectal cancer. Skin cancer. Lung cancer. What should I know about heart disease, diabetes, and high blood pressure? Blood pressure and heart disease High blood pressure causes heart disease and increases the risk of stroke. This is more likely to develop in people who have high blood pressure readings, are of African descent, or are overweight. Have your blood pressure checked: Every 3-5 years if you are 18-39 years of age. Every year if you are 40 years old or older. Diabetes Have regular diabetes screenings. This checks your fasting blood sugar level. Have the screening done: Once every three years after age 40 if you are at a normal weight and have a low risk for diabetes. More often and at a younger age if you are overweight or have a high risk for diabetes. What should I know about preventing infection? Hepatitis B If you have a higher risk for hepatitis B, you should be screened for this virus. Talk with your health care provider to find out if you are at risk for hepatitis B infection. Hepatitis C Testing is recommended for: Everyone born from 1945 through 1965. Anyone with known risk factors for hepatitis C. Sexually transmitted infections (STIs) Get screened for STIs, including gonorrhea and chlamydia, if: You are sexually active and are younger than 36 years of age. You are older than 36 years of age and your   health care provider tells you that you are at risk for this type of infection. Your sexual activity has changed since you were last screened, and you are at increased risk for chlamydia or gonorrhea. Ask your health care provider if you are at risk. Ask your health care provider about whether you are at high risk for HIV. Your health care provider  may recommend a prescription medicine to help prevent HIV infection. If you choose to take medicine to prevent HIV, you should first get tested for HIV. You should then be tested every 3 months for as long as you are taking the medicine. Pregnancy If you are about to stop having your period (premenopausal) and you may become pregnant, seek counseling before you get pregnant. Take 400 to 800 micrograms (mcg) of folic acid every day if you become pregnant. Ask for birth control (contraception) if you want to prevent pregnancy. Osteoporosis and menopause Osteoporosis is a disease in which the bones lose minerals and strength with aging. This can result in bone fractures. If you are 65 years old or older, or if you are at risk for osteoporosis and fractures, ask your health care provider if you should: Be screened for bone loss. Take a calcium or vitamin D supplement to lower your risk of fractures. Be given hormone replacement therapy (HRT) to treat symptoms of menopause. Follow these instructions at home: Lifestyle Do not use any products that contain nicotine or tobacco, such as cigarettes, e-cigarettes, and chewing tobacco. If you need help quitting, ask your health care provider. Do not use street drugs. Do not share needles. Ask your health care provider for help if you need support or information about quitting drugs. Alcohol use Do not drink alcohol if: Your health care provider tells you not to drink. You are pregnant, may be pregnant, or are planning to become pregnant. If you drink alcohol: Limit how much you use to 0-1 drink a day. Limit intake if you are breastfeeding. Be aware of how much alcohol is in your drink. In the [place] one drink equals one 12 oz bottle of beer (355 mL), one 5 oz glass of wine (148 mL), or one 1 oz glass of hard liquor (44 mL). General instructions Schedule regular health, dental, and eye exams. Stay current with your vaccines. Tell your health care  provider if: You often feel depressed. You have ever been abused or do not feel safe at home. Summary Adopting a healthy lifestyle and getting preventive care are important in promoting health and wellness. Follow your health care provider's instructions about healthy diet, exercising, and getting tested or screened for diseases. Follow your health care provider's instructions on monitoring your cholesterol and blood pressure. This information is not intended to replace advice given to you by your health care provider. Make sure you discuss any questions you have with your health care provider. Document Revised: 03/22/2020 Document Reviewed: 01/05/2018 Elsevier Patient Education  2022 [employer]  

## 2020-11-22 NOTE — Telephone Encounter (Signed)
No request for records sent to Dr. Jonette Mate. Pt has seen her since 2015 and all records are in [facility]

## 2021-01-24 ENCOUNTER — Other Ambulatory Visit: Payer: Self-pay

## 2021-01-24 ENCOUNTER — Ambulatory Visit
Admission: EM | Admit: 2021-01-24 | Discharge: 2021-01-24 | Disposition: A | Discharge status: Home / Self Care | Payer: BC Managed Care – PPO

## 2021-01-24 DIAGNOSIS — L739 Follicular disorder, unspecified: Secondary | ICD-10-CM | POA: Diagnosis not present

## 2021-01-24 NOTE — ED Provider Notes (Signed)
[facility]    CSN: [id number] Arrival date & time: 01/24/21  1511    HISTORY  No chief complaint on file.  HPI Wendy Molina is a 35 y.o. female. Pt states she might have gotten bitten by somehthing on her RLE (happened 2 days ago). There is slight redness to the RLE, patient states there is some tenderness to the area.   The history is provided by the patient.  Past Medical History:  Diagnosis Date   Broken foot 08/2014   left   Headache(784.0) 2016   migraine w/o aura   Hyperlipidemia    Low vitamin D level    Migraines    Plantar fasciitis of left foot    Stress fracture of left foot 07/2015   confirmed with MRI   Patient Active Problem List   Diagnosis Date Noted   Elevated LFTs 02/15/2020   Hyperlipidemia    Encounter for long-term current use of medication 10/23/2015   Obesity 10/23/2015   Vitamin D deficiency 02/12/2015   Chronic migraine w/o aura w/o status migrainosus, not intractable    Past Surgical History:  Procedure Laterality Date   INTRAUTERINE DEVICE (IUD) INSERTION     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/01/14   INTRAUTERINE DEVICE INSERTION  12/04/2016   Kyleena    OB History     Gravida  0   Para  0   Term  0   Preterm  0   AB  0   Living  0      SAB  0   IAB  0   Ectopic  0   Multiple  0   Live Births  0          Home Medications    Prior to Admission medications   Medication Sig Start Date End Date Taking? Authorizing Provider  amitriptyline (ELAVIL) 50 MG tablet Take 1 tablet (50 mg total) by mouth at bedtime. 11/22/20   Kuneff, Renee A, DO  levonorgestrel (KYLEENA) 19.5 MG IUD by Intrauterine route. Inserted 12-04-16    [provider]  meloxicam (MOBIC) 15 MG tablet Take 1 tablet (15 mg total) by mouth daily. 05/28/20   Hyatt, Max T, DPM  Multiple Vitamin (MULTIVITAMIN WITH MINERALS) TABS tablet Take 1 tablet by mouth daily.    [provider]  zolmitriptan (ZOMIG-ZMT) 2.5 MG disintegrating  tablet Take 1 tablet (2.5 mg total) by mouth as needed for migraine (may take repeat dose 2 hours later x1). 11/22/20   Natalia Leatherwood, DO    Family History Family History  Problem Relation Age of Onset   Arthritis Mother    Colitis Mother    Hemachromatosis Brother    Heart disease Maternal Aunt        MI 75-56   Lung cancer Maternal Grandmother        smoker   Cancer Maternal Grandmother        kidney   Alzheimer's disease Maternal Grandfather    Leukemia Paternal Grandmother        unsure correct type of cancer   Heart failure Paternal Grandmother    Kidney failure Paternal Grandfather        ? dialysis   Social History Social History   Tobacco Use   Smoking status: Never   Smokeless tobacco: Never  Vaping Use   Vaping Use: Never used  Substance Use Topics   Alcohol use: Yes    Alcohol/week: 5.0 - 6.0 standard drinks    Types: 5 -  6 Standard drinks or equivalent per week   Drug use: No   Allergies   Patient has no known allergies.  Review of Systems Review of Systems Pertinent findings noted in history of present illness.   Physical Exam Triage Vital Signs ED Triage Vitals  Enc Vitals Group     BP 11/22/20 0827 (!) 147/82     Pulse Rate 11/22/20 0827 72     Resp 11/22/20 0827 18     Temp 11/22/20 0827 98.3 F (36.8 C)     Temp Source 11/22/20 0827 Oral     SpO2 11/22/20 0827 98 %     Weight --      Height --      Head Circumference --      Peak Flow --      Pain Score 11/22/20 0826 5     Pain Loc --      Pain Edu? --      Excl. in [place]? --   No data found.  Updated Vital Signs BP 128/84 (BP Location: Left Arm)    Pulse 89    Temp 98 F (36.7 C) (Oral)    Resp 18    SpO2 96%   Physical Exam Vitals and nursing note reviewed.  Constitutional:      General: She is not in acute distress.    Appearance: Normal appearance. She is not ill-appearing.  HENT:     Head: Normocephalic and atraumatic.  Eyes:     General: Lids are normal.        Right  eye: No discharge.        Left eye: No discharge.     Extraocular Movements: Extraocular movements intact.     Conjunctiva/sclera: Conjunctivae normal.     Right eye: Right conjunctiva is not injected.     Left eye: Left conjunctiva is not injected.  Neck:     Trachea: Trachea and phonation normal.  Cardiovascular:     Rate and Rhythm: Normal rate and regular rhythm.     Pulses: Normal pulses.     Heart sounds: Normal heart sounds. No murmur heard.   No friction rub. No gallop.  Pulmonary:     Effort: Pulmonary effort is normal. No accessory muscle usage, prolonged expiration or respiratory distress.     Breath sounds: Normal breath sounds. No stridor, decreased air movement or transmitted upper airway sounds. No decreased breath sounds, wheezing, rhonchi or rales.  Chest:     Chest wall: No tenderness.  Musculoskeletal:        General: Normal range of motion.     Cervical back: Normal range of motion and neck supple. Normal range of motion.  Lymphadenopathy:     Cervical: No cervical adenopathy.  Skin:    General: Skin is warm and dry.     Findings: Lesion (Anterior right lower leg with mild erythema and evidence of folliculitis now partially healed.) present. No erythema or rash.  Neurological:     General: No focal deficit present.     Mental Status: She is alert and oriented to person, place, and time.  Psychiatric:        Mood and Affect: Mood normal.        Behavior: Behavior normal.    Visual Acuity Right Eye Distance:   Left Eye Distance:   Bilateral Distance:    Right Eye Near:   Left Eye Near:    Bilateral Near:     UC Couse / Diagnostics /  Procedures:    EKG  Radiology No results found.  Procedures Procedures (including critical care time)  UC Diagnoses / Final Clinical Impressions(s)   I have reviewed the triage vital signs and the nursing notes.  Pertinent labs & imaging results that were available during my care of the patient were reviewed by me  and considered in my medical decision making (see chart for details).    Final diagnoses:  Folliculitis   Patient provided with bacitracin to place on affected area twice daily.  Patient had already drawn a circle around the area of redness, per my observation redness had receded.  Patient advised to continue to monitor, keep skin well moisturized.  ED Prescriptions   None    PDMP not reviewed this encounter.  Pending results:  Labs Reviewed - No data to display  Medications Ordered in UC: Medications - No data to display  Disposition Upon Discharge:  Condition: stable for discharge home Home: take medications as prescribed; routine discharge instructions as discussed; follow up as advised.  Patient presented with an acute illness with associated systemic symptoms and significant discomfort requiring urgent management. In my opinion, this is a condition that a prudent lay person (someone who possesses an average knowledge of health and medicine) may potentially expect to result in complications if not addressed urgently such as respiratory distress, impairment of bodily function or dysfunction of bodily organs.   Routine symptom specific, illness specific and/or disease specific instructions were discussed with the patient and/or caregiver at length.   As such, the patient has been evaluated and assessed, work-up was performed and treatment was provided in alignment with urgent care protocols and evidence based medicine.  Patient/parent/caregiver has been advised that the patient may require follow up for further testing and treatment if the symptoms continue in spite of treatment, as clinically indicated and appropriate.  If the patient was tested for COVID-19, Influenza and/or RSV, then the patient/parent/guardian was advised to isolate at home pending the results of his/her diagnostic coronavirus test and potentially longer if theyre positive. I have also advised pt that if his/her  COVID-19 test returns positive, it's recommended to self-isolate for at least 10 days after symptoms first appeared AND until fever-free for 24 hours without fever reducer AND other symptoms have improved or resolved. Discussed self-isolation recommendations as well as instructions for household member/close contacts as per the [facility] and [facility], and also gave patient the [place] packet with this information.  Patient/parent/caregiver has been advised to return to the [facility] or PCP in 3-5 days if no better; to PCP or the Emergency Department if new signs and symptoms develop, or if the current signs or symptoms continue to change or worsen for further workup, evaluation and treatment as clinically indicated and appropriate  The patient will follow up with their current PCP if and as advised. If the patient does not currently have a PCP we will assist them in obtaining one.   The patient may need specialty follow up if the symptoms continue, in spite of conservative treatment and management, for further workup, evaluation, consultation and treatment as clinically indicated and appropriate.   Patient/parent/caregiver verbalized understanding and agreement of plan as discussed.  All questions were addressed during visit.  Please see discharge instructions below for further details of plan.  Discharge Instructions:   Discharge Instructions      Please apply bacitracin to lesion twice daily for the next few days, continue to monitor the area for redness to make sure  it continues to recede and not increase circled you so diligently drew around it.  At this time, I feel that oral antibiotics would cause more problems and they will fix.  If you notice that the redness is exceeding your circle, do not hesitate to reach out to urgent care for a prescription for Keflex.  Thank you for visiting urgent care today.  I am so sorry you had to wait so long.        This office note has been dictated using Conserv[employer], museum/gallery.  Unfortunately, and despite my best efforts, this method of dictation can sometimes lead to occasional typographical or grammatical errors.  I apologize in advance if this occurs.     Lynden Oxford Scales, PA-C 01/24/21 1940

## 2021-01-24 NOTE — Discharge Instructions (Signed)
Please apply bacitracin to lesion twice daily for the next few days, continue to monitor the area for redness to make sure it continues to recede and not increase circled you so diligently drew around it.  At this time, I feel that oral antibiotics would cause more problems and they will fix.  If you notice that the redness is exceeding your circle, do not hesitate to reach out to urgent care for a prescription for Keflex.  Thank you for visiting urgent care today.  I am so sorry you had to wait so long.

## 2021-01-24 NOTE — ED Triage Notes (Signed)
Pt states she might have gotten bitten by somehthing on her RLE (happened Wednesday). There is slight redness to the RLE, patient states there is some tenderness to the area.

## 2021-02-04 ENCOUNTER — Telehealth: Payer: Self-pay | Admitting: Obstetrics and Gynecology

## 2021-02-04 DIAGNOSIS — E559 Vitamin D deficiency, unspecified: Secondary | ICD-10-CM

## 2021-02-04 NOTE — Telephone Encounter (Signed)
Patient informed. Patient coming on 02/20/21 @ 4:30pm

## 2021-02-04 NOTE — Telephone Encounter (Signed)
Please contact patient with a reminder to do a lab visit to recheck a vit D level follow up.

## 2021-02-06 ENCOUNTER — Other Ambulatory Visit: Payer: Self-pay

## 2021-02-06 ENCOUNTER — Encounter: Payer: Self-pay | Admitting: Family Medicine

## 2021-02-06 ENCOUNTER — Ambulatory Visit: Payer: BC Managed Care – PPO | Admitting: Family Medicine

## 2021-02-06 VITALS — BP 122/84 | HR 101 | Temp 98.2°F | Ht 67.0 in | Wt 263.0 lb

## 2021-02-06 DIAGNOSIS — L719 Rosacea, unspecified: Secondary | ICD-10-CM | POA: Insufficient documentation

## 2021-02-06 MED ORDER — METRONIDAZOLE 1 % EX GEL: Freq: Every day | CUTANEOUS | 5 refills | Status: DC

## 2021-02-06 NOTE — Patient Instructions (Signed)
Rosacea Rosacea is a long-term (chronic) condition that affects the skin of the face, including the cheeks, nose, forehead, and chin. This condition can also affect the eyes. Rosacea causes blood vessels near the surface of the skin to enlarge, which results in redness. What are the causes? The cause of this condition is not known. Certain triggers can make rosacea worse, including: Hot baths. Exercise. Sunlight. Very hot or cold temperatures. Hot or spicy foods and drinks. Drinking alcohol. Stress. Taking blood pressure medicine. Long-term use of topical steroids on the face. What increases the risk? You are more likely to develop this condition if you: Are older than 37 years of age. Are a woman. Have light-colored skin (light complexion). Have a family history of rosacea. What are the signs or symptoms? Symptoms of this condition include: Redness of the face. Red bumps or pimples on the face. A red, enlarged nose. Blushing easily. Red lines on the skin. Irritated, burning, or itchy feeling in the eyes. Swollen eyelids. Drainage from the eyes. Feeling like there is something in your eye. How is this diagnosed? This condition is diagnosed with a medical history and physical exam. How is this treated? There is no cure for this condition, but treatment can help to control your symptoms. Your health care provider may recommend that you see a skin specialist (dermatologist). Treatment may include: Medicines that are applied to the skin or taken by mouth (orally). This can include antibiotic medicines. Laser treatment to improve the appearance of the skin. Surgery. This is rare. Your health care provider will also recommend the best way to take care of your skin. Even after your skin improves, you will likely need to continue treatment to prevent your rosacea from coming back. Follow these instructions at home: Skin care Take care of your skin as told by your health care provider.  You may be told to do these things: Wash your skin gently two or more times each day. Use mild soap. Use a sunscreen or sunblock with SPF 30 or greater. Use gentle cosmetics that are meant for sensitive skin. Shave with an electric shaver instead of a blade. Lifestyle Try to keep track of what foods trigger this condition. Avoid any triggers. These may include: Spicy foods. Seafood. Cheese. Hot liquids. Nuts. Chocolate. Iodized salt. Do not drink alcohol. Avoid extremely cold or hot temperatures. Try to reduce your stress. If you need help, talk with your health care provider. When you exercise, do these things to stay cool: Limit sun exposure to your face. Use a fan. Do shorter and more frequent intervals of exercise. General instructions Take and apply over-the-counter and prescription medicines only as told by your health care provider. If you were prescribed an antibiotic medicine, apply it or take it as told by your health care provider. Do not stop using the antibiotic even if your condition improves. If your eyelids are affected, apply warm compresses to them. Do this as told by your health care provider. Keep all follow-up visits as told by your health care provider. This is important. Contact a health care provider if: Your symptoms get worse. Your symptoms do not improve after 2 months of treatment. You have new symptoms. You have any changes in vision or you have problems with your eyes, such as redness or itching. You feel depressed. You lose your appetite. You have trouble concentrating. Summary Rosacea is a long-term (chronic) condition that affects the skin of the face, including the cheeks, nose, forehead, and chin. Take   care of your skin as told by your health care provider. Take and apply over-the-counter and prescription medicines only as told by your health care provider. Contact a health care provider if your symptoms get worse or if you have any changes in  vision or other problems with your eyes, such as redness or itching. Keep all follow-up visits as told by your health care provider. This is important. This information is not intended to replace advice given to you by your health care provider. Make sure you discuss any questions you have with your health care provider. Document Revised: 06/16/2017 Document Reviewed: 06/16/2017 Elsevier Patient Education  2022 Elsevier Inc.  

## 2021-02-06 NOTE — Progress Notes (Signed)
This visit occurred during the SARS-CoV-2 public health emergency.  Safety protocols were in place, including screening questions prior to the visit, additional usage of staff PPE, and extensive cleaning of exam room while observing appropriate contact time as indicated for disinfecting solutions.    Wendy Molina , May 03, 1985, 36 y.o., female MRN: [id number] Patient Care Team    Relationship Specialty Notifications Start End  Ma Hillock, DO PCP - General Family Medicine  02/11/15   Nunzio Cobbs, MD Consulting Physician Obstetrics and Gynecology  10/26/16     Chief Complaint  Patient presents with   Rash    Pt c/o right side face having redness rash and itching occurring 01/27/21 and 02/03/21;     Subjective: Pt presents for an OV with complaints of swelling and redness on the right side of her face.  She states the first occurrence have been on 2 January.  With redness, swelling and what she described as a roughness over her right cheek.  The second occurrence was on 9 January with a similar presentation except for the symptoms extended from her right cheek up to just below her right eye.  She reports the redness and the swelling was a solid area and not splotchy.  She states she woke with them.  She denies any changes to her diet, personal care products, detergents etc.  She did start new vitamins the day she had the first occurrence, but she states she stopped them immediately and they were present in her system the day of the second occurrence.  The symptoms lasted few days and then slowly return to normal.  She states her face has always had a natural redness to it.  She has never been evaluated by dermatology.  Depression screen [facility] 2/9 11/22/2020 11/22/2019 10/31/2018 10/28/2017 10/26/2016  Decreased Interest 0 0 0 0 0  Down, Depressed, Hopeless 0 0 0 0 0  PHQ - 2 Score 0 0 0 0 0    No Known Allergies Social History   Social History Narrative   Single- in a  relationship. No children. 3 cats.    Works for [employer]   Take a daily vitamin   Wears her seatbelt, smoke detector in the home.   Hobbies: travel, reading, hiking   Feels safe in relationships   Past Medical History:  Diagnosis Date   Broken foot 08/2014   left   Headache(784.0) 2016   migraine w/o aura   Hyperlipidemia    Low vitamin D level    Migraines    Plantar fasciitis of left foot    Stress fracture of left foot 07/2015   confirmed with MRI   Past Surgical History:  Procedure Laterality Date   INTRAUTERINE DEVICE (IUD) INSERTION     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/01/14   INTRAUTERINE DEVICE INSERTION  12/04/2016   Kyleena    Family History  Problem Relation Age of Onset   Arthritis Mother    Colitis Mother    Hemachromatosis Brother    Heart disease Maternal Aunt        MI 55-56   Lung cancer Maternal Grandmother        smoker   Cancer Maternal Grandmother        kidney   Alzheimer's disease Maternal Grandfather    Leukemia Paternal Grandmother        unsure correct type of cancer   Heart failure Paternal Grandmother    Kidney failure  Paternal Grandfather        ? dialysis   Allergies as of 02/06/2021   No Known Allergies      Medication List        Accurate as of February 06, 2021 11:12 AM. If you have any questions, ask your nurse or doctor.          amitriptyline 50 MG tablet Commonly known as: ELAVIL Take 1 tablet (50 mg total) by mouth at bedtime.   levonorgestrel 19.5 MG IUD Commonly known as: KYLEENA by Intrauterine route. Inserted 12-04-16   meloxicam 15 MG tablet Commonly known as: MOBIC Take 1 tablet (15 mg total) by mouth daily.   multivitamin with minerals Tabs tablet Take 1 tablet by mouth daily.   zolmitriptan 2.5 MG disintegrating tablet Commonly known as: ZOMIG-ZMT Take 1 tablet (2.5 mg total) by mouth as needed for migraine (may take repeat dose 2 hours later x1).        All past medical history,  surgical history, allergies, family history, immunizations andmedications were updated in the EMR today and reviewed under the history and medication portions of their EMR.     ROS Negative, with the exception of above mentioned in HPI   Objective:  BP 122/84    Pulse (!) 101    Temp 98.2 F (36.8 C) (Oral)    Ht 5\' 7"  (1.702 m)    Wt 263 lb (119.3 kg)    SpO2 96%    BMI 41.19 kg/m  Body mass index is 41.19 kg/m.  Physical Exam Constitutional:      General: She is not in acute distress.    Appearance: Normal appearance. She is not ill-appearing, toxic-appearing or diaphoretic.  HENT:     Head: Normocephalic and atraumatic.  Skin:         Comments: Skin of face with mild redness throughout over cheeks. Right zygomatic arch with mild swelling still present, roughness texture.  No tenderness.  No cystic lesions.  Skin intact.  Neurological:     Mental Status: She is alert.      No results found. No results found. No results found for this or any previous visit (from the past 24 hour(s)).  Assessment/Plan: Wendy Molina is a 36 y.o. female present for OV for  Rosacea Symptoms do not appear to be infectious or cellulitic.  Skin is intact, there are no abscesses present.  Not consistent with hives or angioedema.  Do not suspect this is an allergic reaction. Her overall face tone is blotchy and red.  The area of concern has more redness with a rough texture consistent with rosacea. We discussed treatment options for rosacea including creams and sometimes oral antibiotic. Will start MetroGel application to face nightly. She was provided with education on rosacea and preventative health measures. She was also encouraged to try to monitor for any triggers if it occurs again.  There were different types of nuts brought into the home surrounding the time of the first occurrence. She is not seeing improvement in her symptoms within 3-4 weeks, she will follow-up at that time and we  can discuss other options if she desires.  Reviewed expectations re: course of current medical issues. Discussed self-management of symptoms. Outlined signs and symptoms indicating need for more acute intervention. Patient verbalized understanding and all questions were answered. Patient received an After-Visit Summary.    No orders of the defined types were placed in this encounter.  No orders of the defined types were  placed in this encounter.  Referral Orders  No referral(s) requested today     Note is dictated utilizing voice recognition software. Although note has been proof read prior to signing, occasional typographical errors still can be missed. If any questions arise, please do not hesitate to call for verification.   electronically signed by:  Howard Pouch, DO  Tool

## 2021-02-11 ENCOUNTER — Encounter: Payer: Self-pay | Admitting: Podiatry

## 2021-02-11 ENCOUNTER — Other Ambulatory Visit: Payer: Self-pay

## 2021-02-11 ENCOUNTER — Ambulatory Visit: Payer: BC Managed Care – PPO | Admitting: Podiatry

## 2021-02-11 DIAGNOSIS — S93692A Other sprain of left foot, initial encounter: Secondary | ICD-10-CM

## 2021-02-11 DIAGNOSIS — S93622A Sprain of tarsometatarsal ligament of left foot, initial encounter: Secondary | ICD-10-CM | POA: Diagnosis not present

## 2021-02-11 NOTE — Progress Notes (Signed)
She presents today for follow-up of plantar fasciitis left states that is still sore along the lateral side of my foot.  She points to the fourth fifth tarsometatarsal joint as well as the intermetatarsal area.  Objective: She has pain on palpation medial calcaneal tubercle of the left heel severe in nature.  This does not see appear to be resolving.  She also has pain on frontal plane range of motion at the fourth fifth tarsometatarsal joint as well as the intermetatarsal ligament between the fourth and fifth met in the interspace.  Assessment: Probable tear of the plantar fascial left most likely compensatory capsulitis or sprain on the lateral aspect of the foot at the tarsometatarsal joints.  Plan: Due to chronicity and failure of symptoms to resolve an MRI is necessary for differential diagnosis and surgical planning.

## 2021-02-19 ENCOUNTER — Ambulatory Visit: Payer: BC Managed Care – PPO | Admitting: Family Medicine

## 2021-02-20 ENCOUNTER — Other Ambulatory Visit: Payer: BC Managed Care – PPO

## 2021-03-04 ENCOUNTER — Other Ambulatory Visit: Payer: Self-pay

## 2021-03-04 ENCOUNTER — Other Ambulatory Visit: Payer: BC Managed Care – PPO

## 2021-03-04 DIAGNOSIS — E559 Vitamin D deficiency, unspecified: Secondary | ICD-10-CM | POA: Diagnosis not present

## 2021-03-04 LAB — VITAMIN D 25 HYDROXY (VIT D DEFICIENCY, FRACTURES): Vit D, 25-Hydroxy: 27 ng/mL — ABNORMAL LOW (ref 30–100)

## 2021-03-08 ENCOUNTER — Other Ambulatory Visit: Payer: Self-pay

## 2021-03-08 ENCOUNTER — Ambulatory Visit
Admission: RE | Admit: 2021-03-08 | Discharge: 2021-03-08 | Disposition: A | Discharge status: Home / Self Care | Payer: BC Managed Care – PPO | Source: Ambulatory Visit | Attending: Podiatry | Admitting: Podiatry

## 2021-03-08 DIAGNOSIS — R600 Localized edema: Secondary | ICD-10-CM | POA: Diagnosis not present

## 2021-03-08 DIAGNOSIS — M19072 Primary osteoarthritis, left ankle and foot: Secondary | ICD-10-CM | POA: Diagnosis not present

## 2021-03-08 DIAGNOSIS — M65872 Other synovitis and tenosynovitis, left ankle and foot: Secondary | ICD-10-CM | POA: Diagnosis not present

## 2021-03-08 DIAGNOSIS — S93622A Sprain of tarsometatarsal ligament of left foot, initial encounter: Secondary | ICD-10-CM

## 2021-03-08 DIAGNOSIS — S93692A Other sprain of left foot, initial encounter: Secondary | ICD-10-CM

## 2021-03-08 IMAGING — MR MR ANKLE*L* W/O CM
5 series · 37 of 40 positions shown · non-contrast
Comparison: [DATE]

CLINICAL DATA: Foot ankle pain.  Status post fall 6 years ago.

EXAM:
MRI OF THE LEFT FOOT WITHOUT CONTRAST
MRI OF THE LEFT ANKLE WITHOUT CONTRAST
TECHNIQUE: Multiplanar, multisequence MR imaging of the left was performed. No
intravenous contrast was administered.
Multiplanar, multisequence MR imaging of the left ankle was
performed. No intravenous contrast was administered.

[Series 4: T2 fat-sat · axial · 3.5mm · 0.53mm/px · z∈[-57,+84]mm · 9 of 32 slices shown (1 of 2)]
[im 1/32]
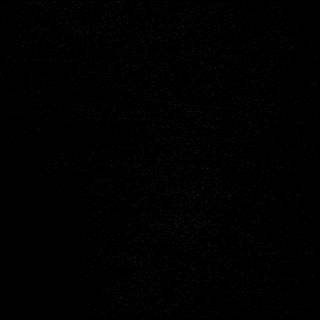
[im 4/32]
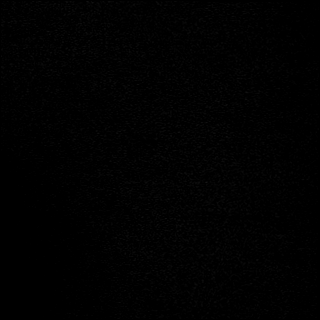
[im 8/32]
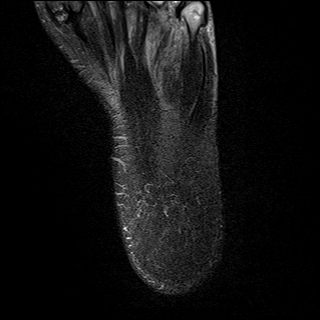
[im 12/32]
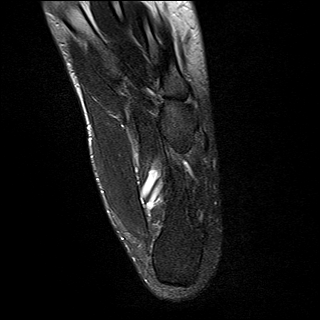
[im 16/32]
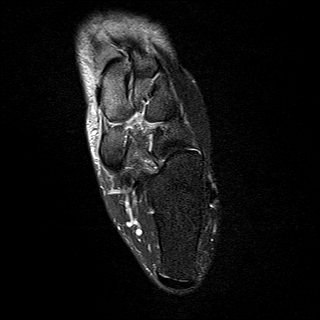
[im 20/32]
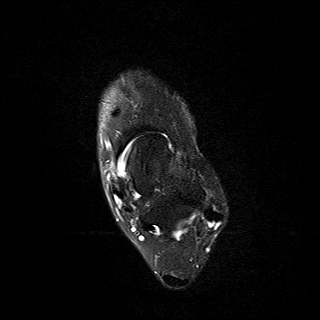
[im 24/32]
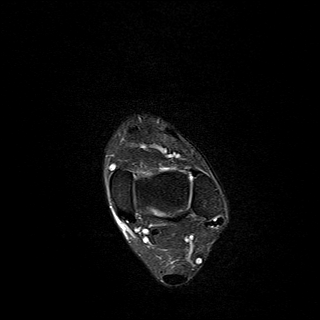
[im 28/32]
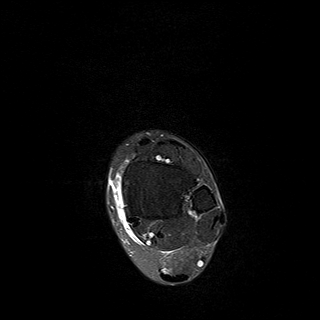
[im 32/32]
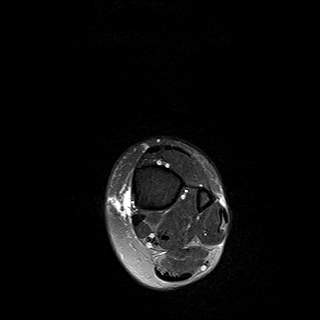

[Series 5: PD fat-sat · axial · 3.5mm · 0.53mm/px · z∈[-57,+84]mm · 9 of 32 slices shown]
[im 1/32]
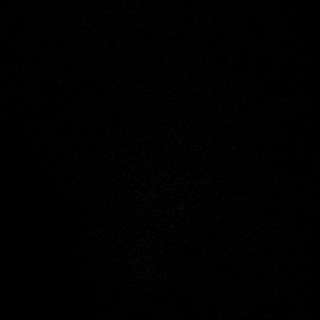
[im 4/32]
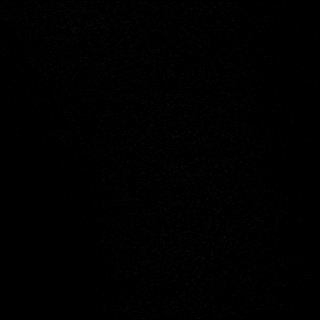
[im 8/32]
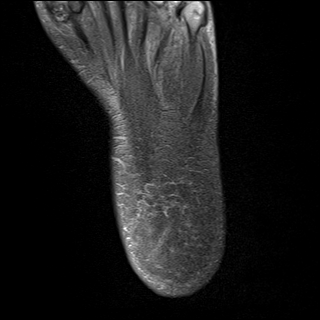
[im 12/32]
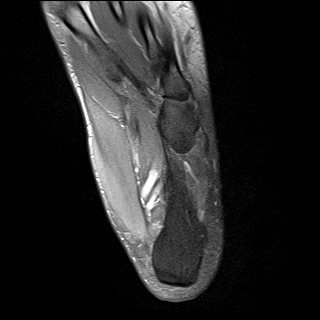
[im 16/32]
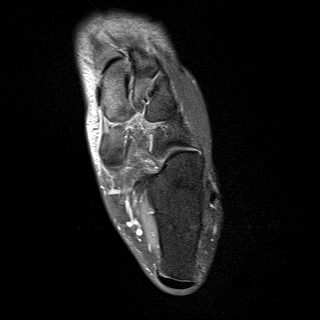
[im 20/32]
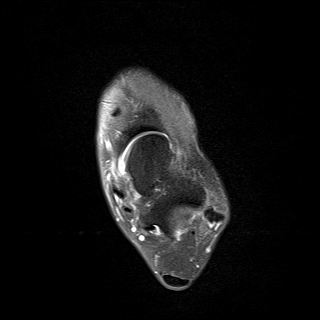
[im 24/32]
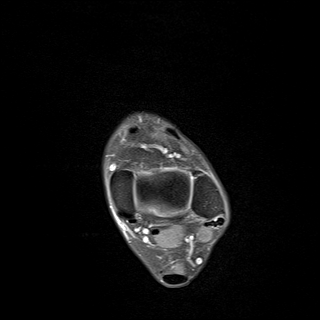
[im 28/32]
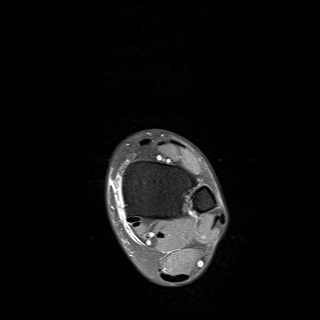
[im 32/32]
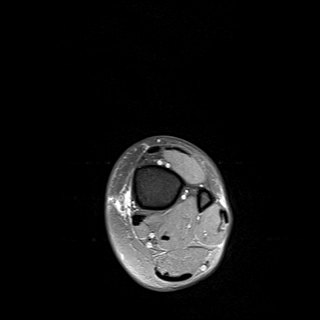

[Series 6: T1 · sagittal · 4.0mm · 0.56mm/px · 6 of 23 slices shown]
[im 1/23]
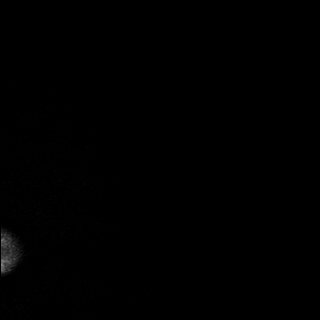
[im 5/23]
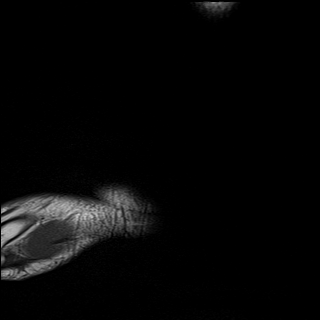
[im 9/23]
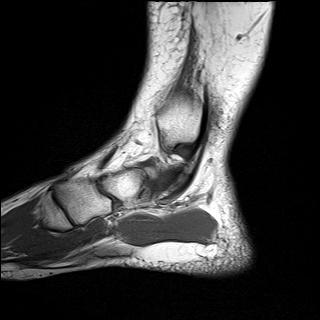
[im 14/23]
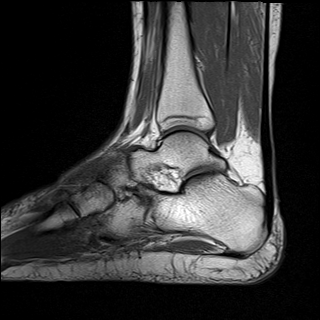
[im 18/23]
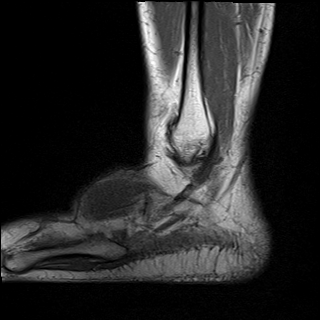
[im 23/23]
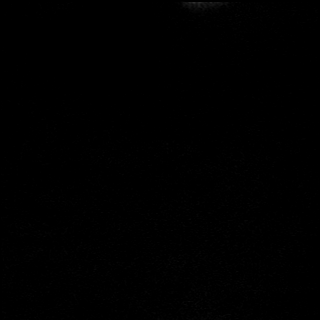

[Series 7: STIR · sagittal · 4.0mm · 0.35mm/px · 5 of 23 slices shown]
[im 1/23]
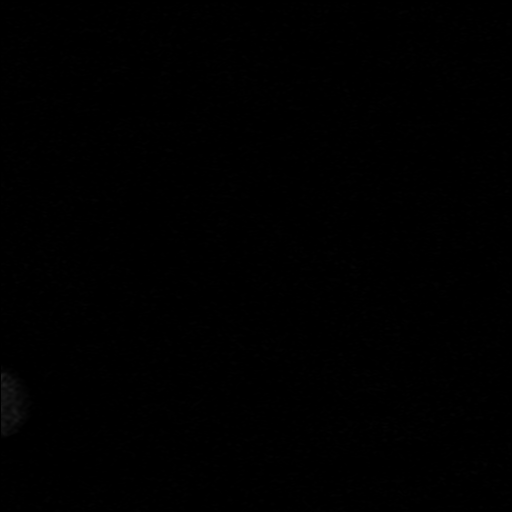
[im 5/23]
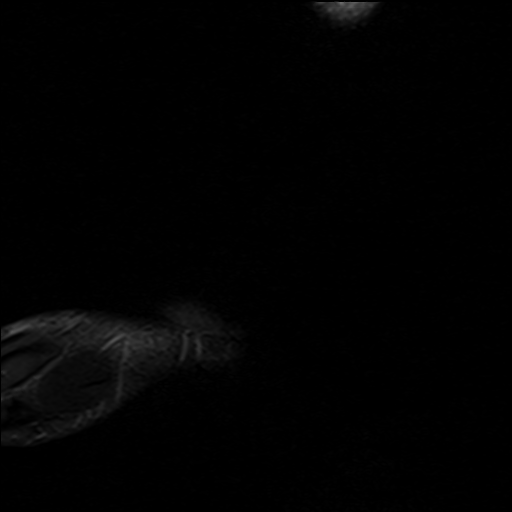
[im 9/23]
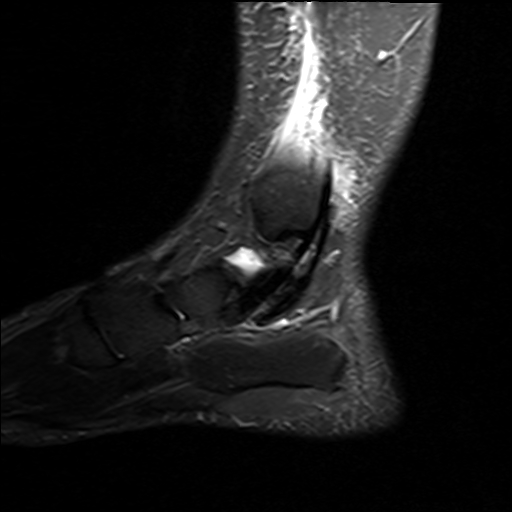
[im 14/23]
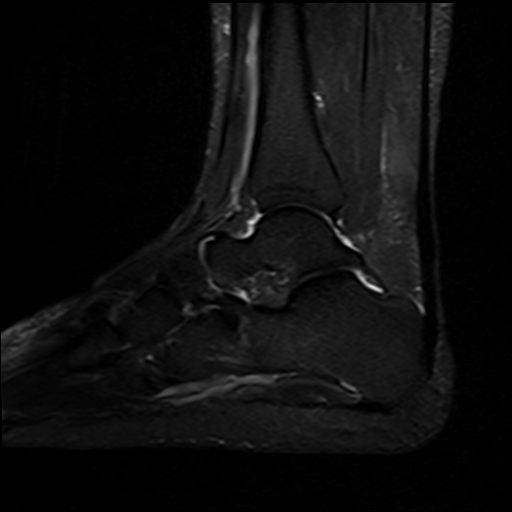
[im 18/23]
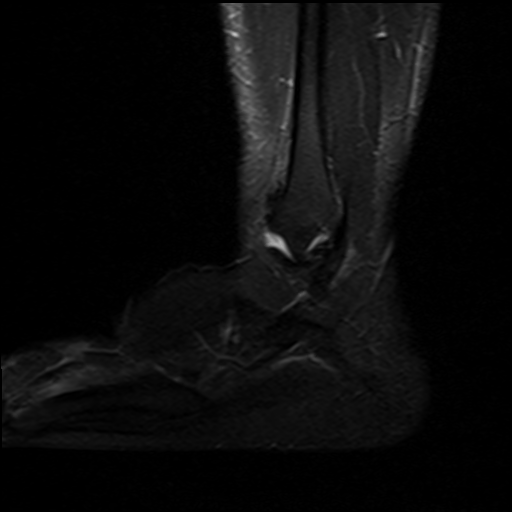

[Series 8: T2 fat-sat · coronal · 3.5mm · 0.50mm/px · 8 of 39 slices shown (2 of 2)]
[im 1/39]
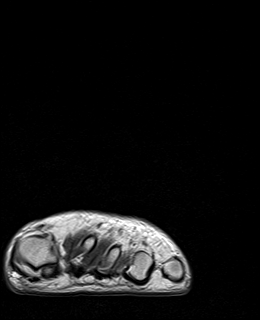
[im 5/39]
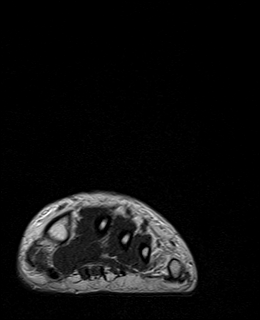
[im 13/39]
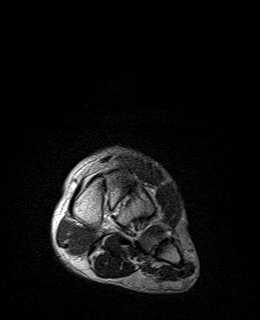
[im 17/39]
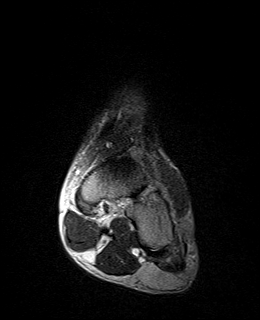
[im 22/39]
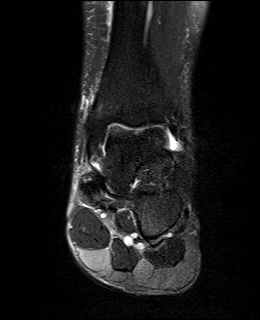
[im 26/39]
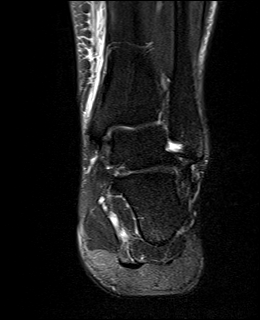
[im 34/39]
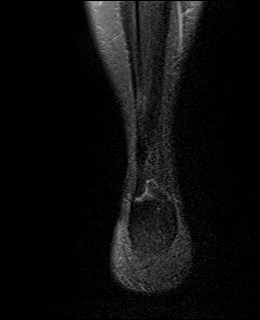
[im 39/39]
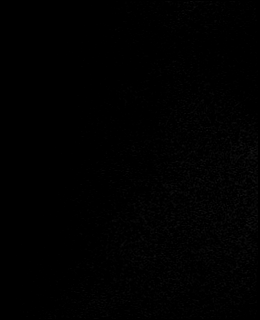

[37 of 40 positions shown; findings below may reference images not displayed]

FINDINGS: TENDONS

Peroneal: Mild peroneal tenosynovitis. Peroneus longus tendon is
intact. Mild tendinosis of the peroneus brevis.

Posteromedial: Posterior tibial tendon intact. Flexor hallucis
longus tendon intact. Flexor digitorum longus tendon intact.

Anterior: Tibialis anterior tendon intact. Extensor hallucis longus
tendon intact Extensor digitorum longus tendon intact.

Achilles:  Intact.

Plantar Fascia: Intact.

LIGAMENTS

Lateral: Anterior talofibular ligament intact. Calcaneofibular
ligament intact. Posterior talofibular ligament intact. Anterior and
posterior tibiofibular ligaments intact.

Medial: Deltoid ligament intact. Spring ligament intact.

Lisfranc: Intact

Collaterals: Intact

CARTILAGE

Ankle Joint: No joint effusion. Normal ankle mortise. No chondral
defect.

Subtalar Joints/Sinus Tarsi: Normal subtalar joints. No subtalar
joint effusion. Normal sinus tarsi.

Bones: No acute fracture or dislocation. Bipartite medial hallux
sesamoid with mild marrow edema as can be seen with sesamoiditis
similar in appearance to the prior exam of [DATE]. Mild
osteoarthritis of the second tarsometatarsal joint. Mild
osteoarthritis of the first MTP joint.

Soft Tissue: No fluid collection or hematoma. Muscles are normal
without edema or atrophy. Tarsal tunnel is normal.
IMPRESSION: 1. Normal plantar fascia.
2. Mild peroneal tenosynovitis. Mild tendinosis of the peroneus
brevis.
3. Bipartite medial hallux sesamoid with mild marrow edema as can be
seen with sesamoiditis similar in appearance to the prior exam of
[DATE].
[DATE]. Mild osteoarthritis of the second tarsometatarsal joint.
5. Mild osteoarthritis of the first MTP joint.

## 2021-03-08 IMAGING — MR MR FOOT*L* W/O CM
4 of 5 series · 18 of 40 positions shown · non-contrast
Comparison: [DATE]

CLINICAL DATA: Foot ankle pain.  Status post fall 6 years ago.

EXAM:
MRI OF THE LEFT FOOT WITHOUT CONTRAST
MRI OF THE LEFT ANKLE WITHOUT CONTRAST
TECHNIQUE: Multiplanar, multisequence MR imaging of the left was performed. No
intravenous contrast was administered.
Multiplanar, multisequence MR imaging of the left ankle was
performed. No intravenous contrast was administered.

[Series 4: T1 · coronal · 3.5mm · 0.22mm/px · 3 of 44 slices shown (1 of 2)]
[im 5/44]
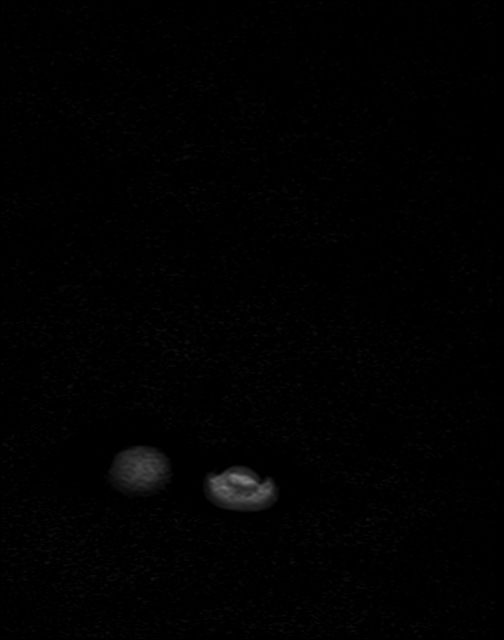
[im 24/44]
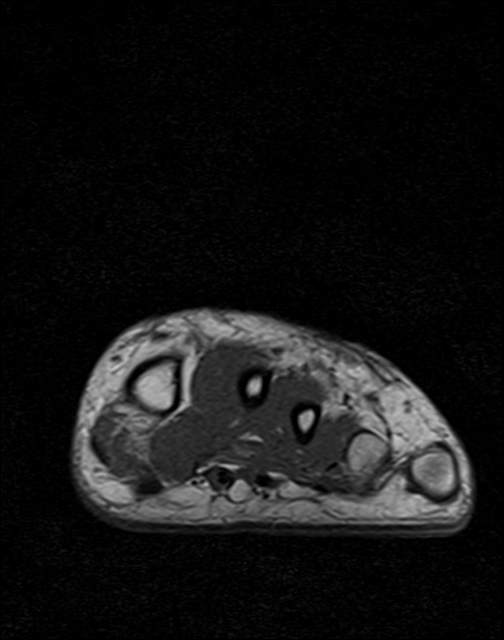
[im 39/44]
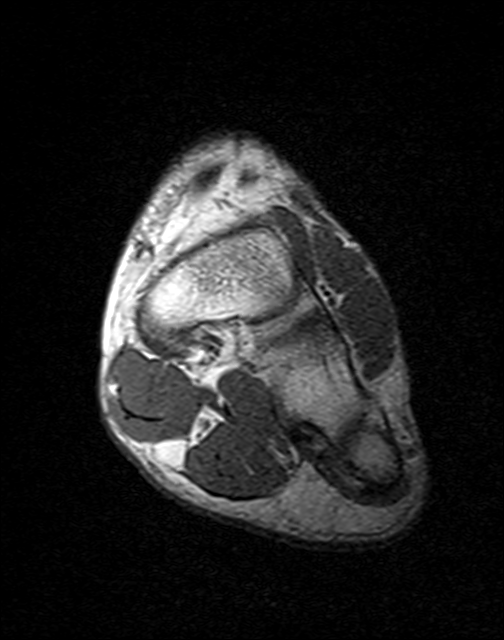

[Series 5: T2 fat-sat · coronal · 3.5mm · 0.22mm/px · 9 of 44 slices shown (1 of 2)]
[im 1/44]
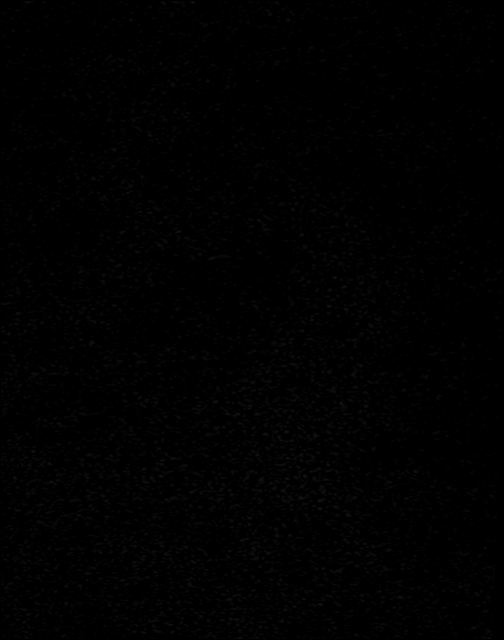
[im 5/44]
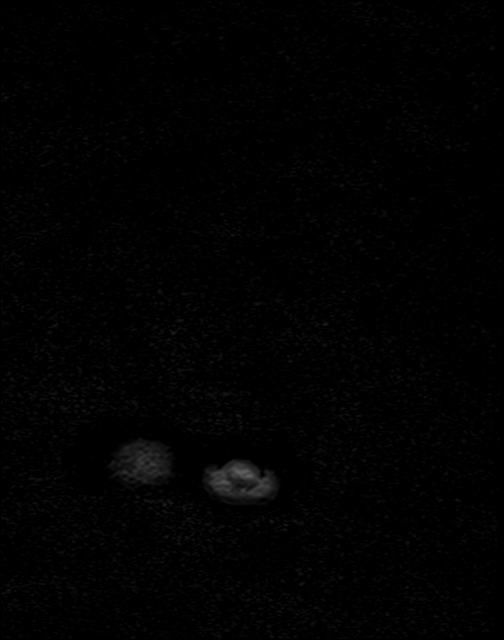
[im 9/44]
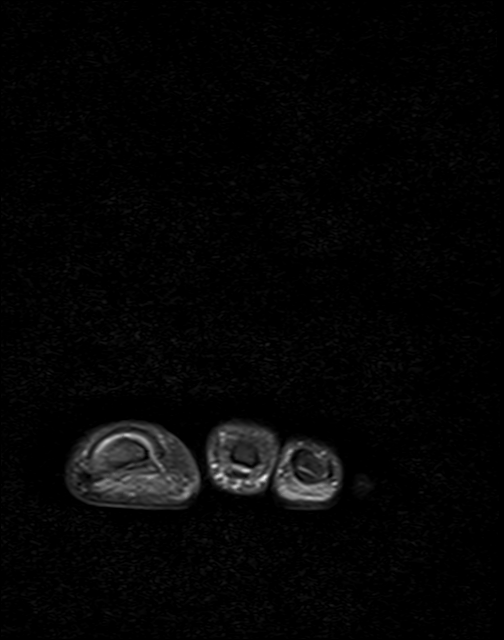
[im 13/44]
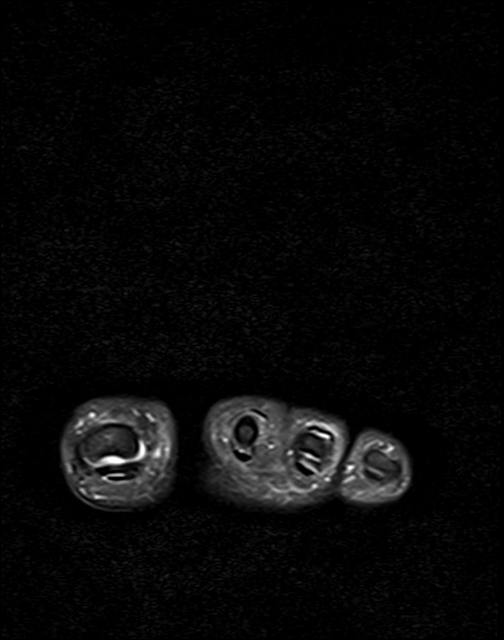
[im 18/44]
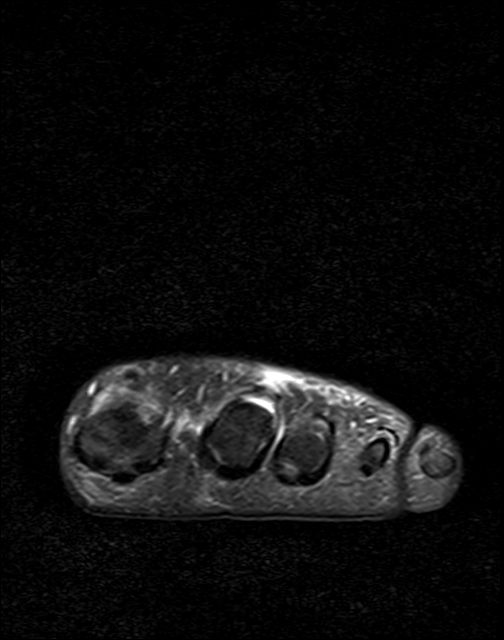
[im 22/44]
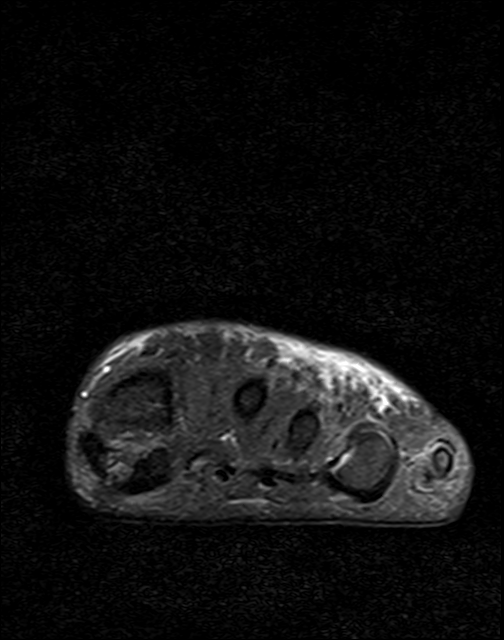
[im 26/44]
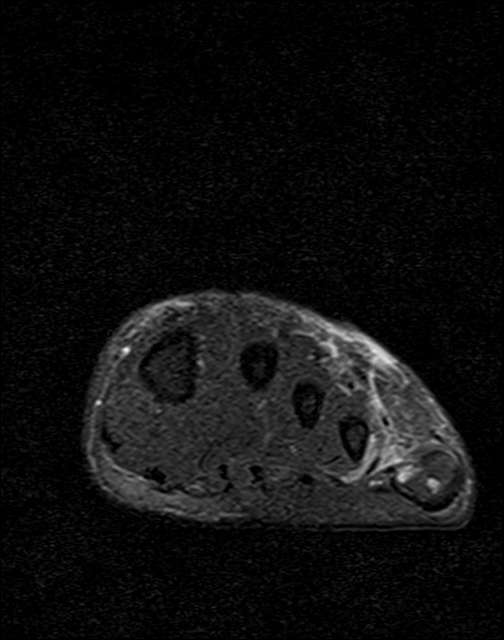
[im 31/44]
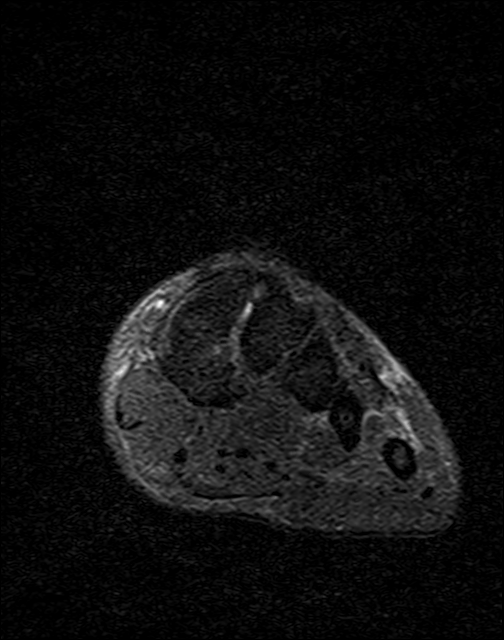
[im 39/44]
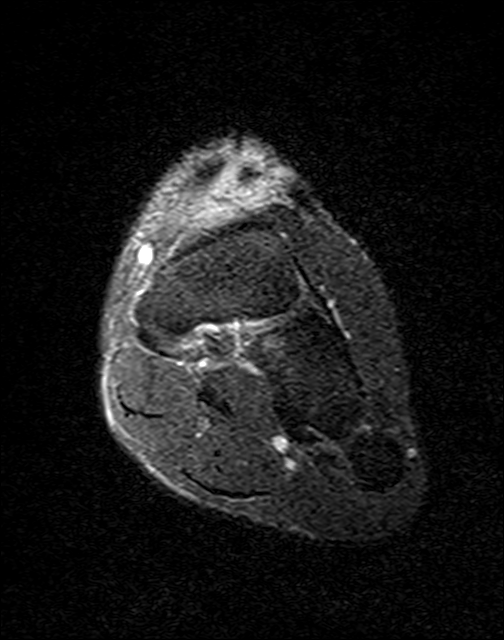

[Series 6: T2 fat-sat · axial · 3.5mm · 0.35mm/px · z∈[-36,+43]mm · 3 of 22 slices shown (2 of 2)]
[im 5/22]
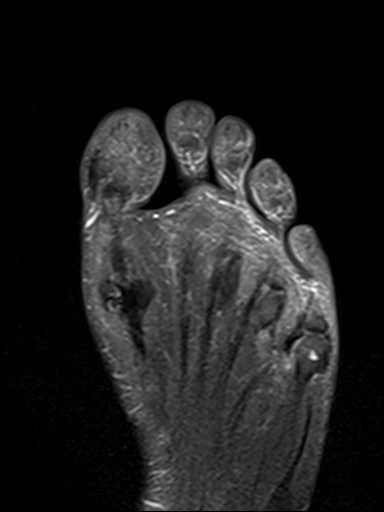
[im 13/22]
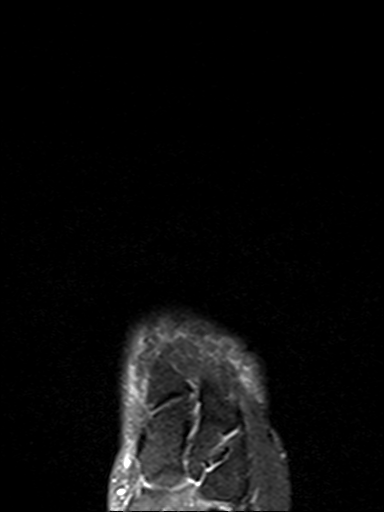
[im 22/22]
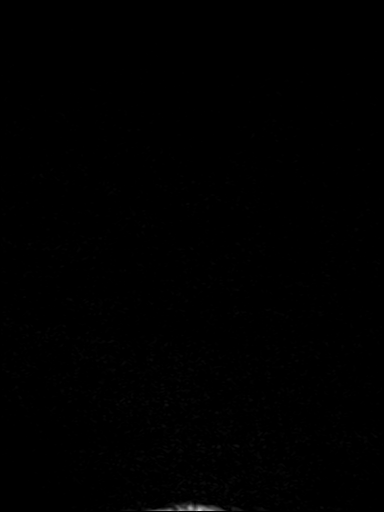

[Series 7: T1 · axial · 3.5mm · 0.35mm/px · z∈[-36,+43]mm · 3 of 22 slices shown (2 of 2)]
[im 5/22]
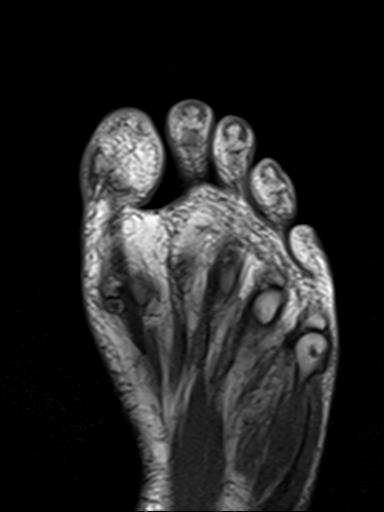
[im 13/22]
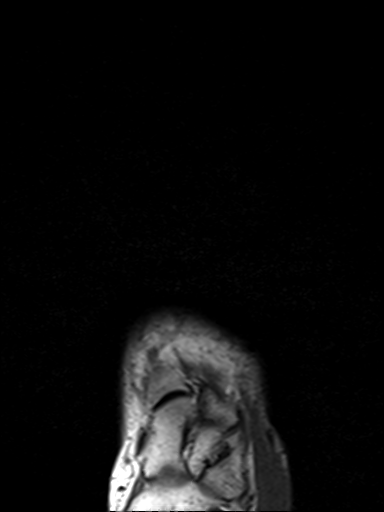
[im 22/22]
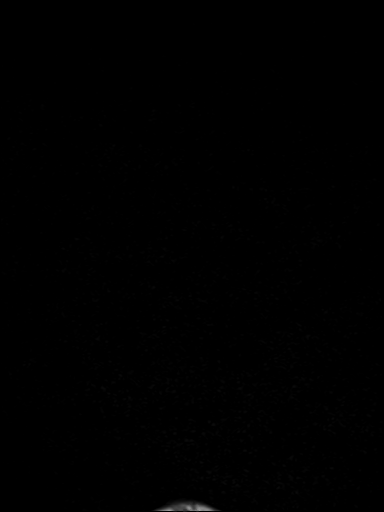

[18 of 40 positions shown; findings below may reference images not displayed]

FINDINGS: TENDONS

Peroneal: Mild peroneal tenosynovitis. Peroneus longus tendon is
intact. Mild tendinosis of the peroneus brevis.

Posteromedial: Posterior tibial tendon intact. Flexor hallucis
longus tendon intact. Flexor digitorum longus tendon intact.

Anterior: Tibialis anterior tendon intact. Extensor hallucis longus
tendon intact Extensor digitorum longus tendon intact.

Achilles:  Intact.

Plantar Fascia: Intact.

LIGAMENTS

Lateral: Anterior talofibular ligament intact. Calcaneofibular
ligament intact. Posterior talofibular ligament intact. Anterior and
posterior tibiofibular ligaments intact.

Medial: Deltoid ligament intact. Spring ligament intact.

Lisfranc: Intact

Collaterals: Intact

CARTILAGE

Ankle Joint: No joint effusion. Normal ankle mortise. No chondral
defect.

Subtalar Joints/Sinus Tarsi: Normal subtalar joints. No subtalar
joint effusion. Normal sinus tarsi.

Bones: No acute fracture or dislocation. Bipartite medial hallux
sesamoid with mild marrow edema as can be seen with sesamoiditis
similar in appearance to the prior exam of [DATE]. Mild
osteoarthritis of the second tarsometatarsal joint. Mild
osteoarthritis of the first MTP joint.

Soft Tissue: No fluid collection or hematoma. Muscles are normal
without edema or atrophy. Tarsal tunnel is normal.
IMPRESSION: 1. Normal plantar fascia.
2. Mild peroneal tenosynovitis. Mild tendinosis of the peroneus
brevis.
3. Bipartite medial hallux sesamoid with mild marrow edema as can be
seen with sesamoiditis similar in appearance to the prior exam of
[DATE].
[DATE]. Mild osteoarthritis of the second tarsometatarsal joint.
5. Mild osteoarthritis of the first MTP joint.

## 2021-03-25 ENCOUNTER — Ambulatory Visit: Payer: BC Managed Care – PPO | Admitting: Podiatry

## 2021-03-26 ENCOUNTER — Telehealth: Payer: Self-pay | Admitting: *Deleted

## 2021-03-26 NOTE — Telephone Encounter (Signed)
Faxed request to [facility] to send patient's MRI disc to [facility].  Faxed order to [facility] for what to review for 2nd opinion.   

## 2021-03-26 NOTE — Telephone Encounter (Signed)
-----   Message from Elinor Parkinson, [place] sent at 03/24/2021  7:05 AM EST ----- ?Wendy Molina please send this for an over read.  Ask them to look at the fourth intermetatarsal space and the plantar fascia . ? ?Please inform the patient of the delay ?

## 2021-04-02 NOTE — Telephone Encounter (Signed)
Patient called today wanting to follow up on the second opinion of her MRI , have you heard back from anyone yet?

## 2021-04-03 ENCOUNTER — Encounter: Payer: Self-pay | Admitting: *Deleted

## 2021-04-03 ENCOUNTER — Encounter: Payer: Self-pay | Admitting: Podiatry

## 2021-04-03 NOTE — Telephone Encounter (Signed)
Received MRI overread today. Will send to Dr. Al Corpus for review. ?

## 2021-04-08 ENCOUNTER — Encounter: Payer: Self-pay | Admitting: Podiatry

## 2021-04-08 ENCOUNTER — Other Ambulatory Visit: Payer: Self-pay

## 2021-04-08 ENCOUNTER — Ambulatory Visit: Payer: BC Managed Care – PPO | Admitting: Podiatry

## 2021-04-08 DIAGNOSIS — M7672 Peroneal tendinitis, left leg: Secondary | ICD-10-CM

## 2021-04-08 DIAGNOSIS — S93622D Sprain of tarsometatarsal ligament of left foot, subsequent encounter: Secondary | ICD-10-CM

## 2021-04-08 DIAGNOSIS — M722 Plantar fascial fibromatosis: Secondary | ICD-10-CM

## 2021-04-08 NOTE — Progress Notes (Signed)
She presents today for follow-up of her MRI she states that is a little worse if he could actually be any worse. ? ?Objective: Vital signs stable alert oriented x3 there is no erythema edema salines drainage odor or stones pain on palpation medial calcaneal tubercle of the fourth fifth tarsometatarsal joint area and in between the fourth and fifth metatarsals.  Also has pain along the peroneal tendons. ? ?MRI is suggestive of peroneal tenosynovitis some capsulitis with edema in the fourth fifth tarsometatarsal joint area and a muscle strain in this area as well did not demonstrate any significant osteoarthritic change in the rear foot though it does demonstrate some in the first metatarsal phalangeal joint. ? 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  Though it is not visualized on MRI.  This is a chronic issue not anything inflammatory so it may not show as well. ? ?Plan: At this point unguinal send her to physical therapy for evaluation and management of this with treatment.  If this fails to alleviate her symptoms we will consider surgical options. ?

## 2021-04-10 ENCOUNTER — Encounter: Payer: Self-pay | Admitting: Physical Therapy

## 2021-04-10 ENCOUNTER — Other Ambulatory Visit: Payer: Self-pay

## 2021-04-10 ENCOUNTER — Ambulatory Visit: Payer: BC Managed Care – PPO | Attending: Podiatry | Admitting: Physical Therapy

## 2021-04-10 DIAGNOSIS — M7672 Peroneal tendinitis, left leg: Secondary | ICD-10-CM | POA: Diagnosis not present

## 2021-04-10 DIAGNOSIS — R600 Localized edema: Secondary | ICD-10-CM | POA: Diagnosis not present

## 2021-04-10 DIAGNOSIS — S93622D Sprain of tarsometatarsal ligament of left foot, subsequent encounter: Secondary | ICD-10-CM | POA: Insufficient documentation

## 2021-04-10 DIAGNOSIS — M25572 Pain in left ankle and joints of left foot: Secondary | ICD-10-CM

## 2021-04-10 DIAGNOSIS — R262 Difficulty in walking, not elsewhere classified: Secondary | ICD-10-CM | POA: Diagnosis not present

## 2021-04-10 DIAGNOSIS — M722 Plantar fascial fibromatosis: Secondary | ICD-10-CM | POA: Insufficient documentation

## 2021-04-10 DIAGNOSIS — M6281 Muscle weakness (generalized): Secondary | ICD-10-CM | POA: Diagnosis not present

## 2021-04-10 NOTE — Patient Instructions (Signed)
Access Code: [email or web address] ?URL: [email or web address] ?Date: 04/10/2021 ?Prepared by: Oley Balm ? ?Exercises ?Seated Great Toe Extension - 1 x daily - 7 x weekly - 2 sets - 10 reps ?Seated Toe Raise - 1 x daily - 7 x weekly - 2 sets - 10 reps ?Toe Spreading - 1 x daily - 7 x weekly - 2 sets - 10 reps ?Towel Scrunches - 1 x daily - 7 x weekly - 2 sets - 10 reps ?Seated Heel Toe Raises - 1 x daily - 7 x weekly - 2 sets - 10 reps ?Seated Ankle Circles - 1 x daily - 7 x weekly - 2 sets - 10 reps ?Supine Ankle Inversion Eversion AROM - 1 x daily - 7 x weekly - 2 sets - 10 reps ? ?

## 2021-04-10 NOTE — Therapy (Signed)
[facility] ?[facility] ?[address] ?[place], [place], [address] ?Phone: [phone]   Fax:  [phone] ? ?Physical Therapy Treatment ? ?Patient Details  ?Name: Wendy Molina ?MRN: [id number] ?Date of Birth: 06/27/1985 ?Referring Provider (PT): [place], [place] ? ? ?Encounter Date: 04/10/2021 ? ? PT End of Session - 04/10/21 1243   ? ? Visit Number 1   ? Date for PT Re-Evaluation 06/19/21   ? PT Start Time 1020   ? PT Stop Time 1058   ? PT Time Calculation (min) 38 min   ? Activity Tolerance Patient tolerated treatment well   ? Behavior During Therapy [facility] for tasks assessed/performed   ? ?  ?  ? ?  ? ? ?Past Medical History:  ?Diagnosis Date  ? Broken foot 08/2014  ? left  ? [id number]) 2016  ? migraine w/o aura  ? Hyperlipidemia   ? Low vitamin D level   ? Migraines   ? Plantar fasciitis of left foot   ? Stress fracture of left foot 07/2015  ? confirmed with MRI  ? ? ?Past Surgical History:  ?Procedure Laterality Date  ? INTRAUTERINE DEVICE (IUD) INSERTION    ? 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 INTRAUTERINE DEVICE INSERTION  12/04/2016  ? Kyleena   ? ? ?There were no vitals filed for this visit. ? ? Subjective Assessment - 04/10/21 1020   ? ? Subjective Patient broke her foot about 6 years ago. It was not immediately caught. It resolved. She was diagnosed with L foot PF 5 years ago, which has never resolved completely. MRI in 2/23 showed multiple issues including tndonitis, tenosynovitis, medial ankle swelling. She has pain which is worst first thing in the morning or if she has been sititng for a while-lateral foot and ant heel. Improves with more movement, but does not resolve. She cannot stand on her toes.   ? Limitations Standing;Walking;House hold activities   ? How long can you sit comfortably? no pain, but stiffens up.   ? How long can you stand comfortably? Immediate pain   ? How long can you walk comfortably? Immediate pain, improves slightly with movement, but does not  go away completely.   ? Diagnostic tests MRI-Peroneal tendinitis of left lower extremity  Mild tendinosis of the juxtamalleolar segment of the peroneus longus an brevis tendons and minimal adjacent peroneal tenosynovitis without tear  mil/mod subcutaneous edema/soft tissue swelling within the medial L ankle   ? Patient Stated Goals Patient would like to be able to walk with minimal pain.   ? Currently in Pain? Yes   ? Pain Score 2    ? Pain Location Foot   ? Pain Orientation Left;Lateral;Distal   ? Pain Descriptors / Indicators Sharp;Shooting   ? Pain Type Chronic pain   ? Pain Radiating Towards first thing in the morning it reaches 8/10, goes up her leg to below knee.   ? Pain Onset More than a month ago   ? Pain Frequency Intermittent   ? Aggravating Factors  Standing, walking   ? Pain Relieving Factors elevation, shoes off   ? Effect of Pain on Daily Activities limits all mobility.   ? ?  ?  ? ?  ? ? ? ? ? [facility] PT Assessment - 04/10/21 0001   ? ?  ? Assessment  ? Medical Diagnosis L foot pain   ? Referring Provider (PT) [place], Max   ?  ? Balance Screen  ? Has the patient  fallen in the past 6 months Yes   ? How many times? 1   ?  ? Home Environment  ? Living Environment Private residence   ? Living Arrangements Spouse/significant other   ? Type of Home House   ? Home Access Level entry   ? Home Layout Two level   ? Alternate Level Stairs-Number of Steps 14   ? Additional Comments Difficult to climb steps, especially after sitting or first thing in the morning.   ?  ? Prior Function  ? Level of Independence Independent   ? Vocation Full time employment   ? Vocation Requirements sits at a desk.   ? Leisure walking, travel   ?  ? Cognition  ? Overall Cognitive Status Within Functional Limits for tasks assessed   ?  ? Observation/Other Assessments-Edema   ? Edema Circumferential   L ankle with mild edema.  ?  ? ROM / Strength  ? AROM / PROM / Strength AROM;Strength   ?  ? AROM  ? Overall AROM Comments AROM of B foot  and ankles WFL   ?  ? Strength  ? Overall Strength Comments B hips with mild weakness, B feet and ankles WFL, equal. Pain with active ankle PF, resisted eversion and inversion.   ?  ? Flexibility  ? Soft Tissue Assessment /Muscle Length yes   ?  ? Palpation  ? Palpation comment Patient reports TTP on L, posterior to lateral malleolus, achilles tendon, ant calcaneus on plantar surface, lateral L foot.   ? ?  ?  ? ?  ? ? ? ? ? ? ? ? ? ? ? ? ? ? ? ? ? ? ? ? ? ? ? ? ? PT Education - 04/10/21 1242   ? ? Education Details POC, inital HEP   ? Person(s) Educated Patient   ? Methods Explanation;Demonstration;Handout   ? Comprehension Returned demonstration;Verbalized understanding   ? ?  ?  ? ?  ? ? ? PT Short Term Goals - 04/10/21 1250   ? ?  ? PT SHORT TERM GOAL #1  ? Title I with initial HEP   ? Time 3   ? Period Weeks   ? Status New   ? Target Date 05/01/21   ? ?  ?  ? ?  ? ? ? ? PT Long Term Goals - 04/10/21 1255   ? ?  ? PT LONG TERM GOAL #1  ? Title I with final HEP   ? Time 10   ? Period Weeks   ? Status New   ? Target Date 06/19/21   ?  ? PT LONG TERM GOAL #2  ? Title Patient will be able to perform 10 single limb heel raises on level ground with pain< 3/10   ? Baseline up to 8/10   ? Time 10   ? Period Weeks   ? Status New   ? Target Date 06/19/21   ?  ? PT LONG TERM GOAL #3  ? Title Patient will report morning pain and stiffness of <3/10 in L foot and ankle.   ? Baseline up to 8/10   ? Time 10   ? Period Weeks   ? Status New   ? Target Date 06/19/21   ?  ? PT LONG TERM GOAL #4  ? Title Patient will be able to walk x at least 1/2 mile with pain in L ankle <3/10.   ? Baseline Unable to walk for any distance  without pain.   ? Time 10   ? Period Weeks   ? Status New   ? Target Date 06/19/21   ? ?  ?  ? ?  ? ? ? ? ? ? ? ? Plan - 04/10/21 1244   ? ? Clinical Impression Statement Patient sustained L foot fracture 6 years ago, diagnosed with PF 5 years ago with little relief since that time. She presents today iwth  generalized weakness, pain with L ankle movements, TTP over L achilles, peroneus tendons, ant calcaneus, lateral foot. Her pain and weakness limit her mobility, unable to push up onto toes, limited walking and standing, difficulty negotiating steps in her home, due to pain. She will benefit from PT to treat acute tendonitis and pain, educate patient to stegnth and flexibility program, to improve functional mobility and reduce her pain.   ? Personal Factors and Comorbidities Age;Fitness;Comorbidity 1   ? Comorbidities previous foot fracture, L.   ? Examination-Activity Limitations Reach Overhead;Locomotion Level;Transfers;Bend;Carry;Squat;Stairs;Stand   ? Examination-Participation Restrictions Meal Prep;Cleaning;Valla Leaver Work   ? Stability/Clinical Decision Making Evolving/Moderate complexity   ? Clinical Decision Making Moderate   ? Rehab Potential Good   ? PT Frequency 2x / week   ? PT Duration Other (comment)   10w  ? PT Treatment/Interventions ADLs/Self Care Home Management;Cryotherapy;Electrical Stimulation;Contrast Bath;Ultrasound;Moist Heat;Iontophoresis 4mg /ml Dexamethasone;Gait training;Stair training;Functional mobility training;Therapeutic activities;Therapeutic exercise;Balance training;Neuromuscular re-education;Manual techniques;Dry needling;Passive range of motion;Patient/family education   ? PT Next Visit Plan Treat her tendonitis   ? PT Home Exercise Plan [phone]   ? Consulted and Agree with Plan of Care Patient   ? ?  ?  ? ?  ? ? ?Patient will benefit from skilled therapeutic intervention in order to improve the following deficits and impairments:  Increased edema, Decreased strength, Decreased mobility, Postural dysfunction, Impaired sensation, Improper body mechanics, Impaired flexibility, Pain, Difficulty walking, Decreased range of motion ? ?Visit Diagnosis: ?Pain in left ankle and joints of left foot ? ?Muscle weakness (generalized) ? ?Difficulty in walking, not elsewhere classified ? ?Localized  edema ? ? ? ? ?Problem List ?Patient Active Problem List  ? Diagnosis Date Noted  ? Rosacea 02/06/2021  ? Elevated LFTs 02/15/2020  ? Hyperlipidemia   ? Encounter for long-term current use of medication 09/27/

## 2021-04-25 ENCOUNTER — Encounter: Payer: Self-pay | Admitting: Physical Therapy

## 2021-04-25 ENCOUNTER — Ambulatory Visit: Payer: BC Managed Care – PPO | Admitting: Physical Therapy

## 2021-04-25 DIAGNOSIS — R600 Localized edema: Secondary | ICD-10-CM | POA: Diagnosis not present

## 2021-04-25 DIAGNOSIS — M25572 Pain in left ankle and joints of left foot: Secondary | ICD-10-CM

## 2021-04-25 DIAGNOSIS — R262 Difficulty in walking, not elsewhere classified: Secondary | ICD-10-CM

## 2021-04-25 DIAGNOSIS — M6281 Muscle weakness (generalized): Secondary | ICD-10-CM | POA: Diagnosis not present

## 2021-04-25 DIAGNOSIS — M7672 Peroneal tendinitis, left leg: Secondary | ICD-10-CM | POA: Diagnosis not present

## 2021-04-25 DIAGNOSIS — S93622D Sprain of tarsometatarsal ligament of left foot, subsequent encounter: Secondary | ICD-10-CM | POA: Diagnosis not present

## 2021-04-25 DIAGNOSIS — M722 Plantar fascial fibromatosis: Secondary | ICD-10-CM | POA: Diagnosis not present

## 2021-04-25 NOTE — Therapy (Signed)
[facility] ?[facility] ?[address] ?[place], [place], [address] ?Phone: [phone]   Fax:  [phone] ? ?Physical Therapy Treatment ? ?Patient Details  ?Name: Wendy Molina ?MRN: [id number] ?Date of Birth: 1985-10-22 ?Referring Provider (PT): [place], [place] ? ? ?Encounter Date: 04/25/2021 ? ? PT End of Session - 04/25/21 1103   ? ? Visit Number 2   ? Date for PT Re-Evaluation 06/19/21   ? PT Start Time [phone]   ? PT Stop Time 1012   ? PT Time Calculation (min) 44 min   ? Activity Tolerance Patient tolerated treatment well   ? Behavior During Therapy [facility] for tasks assessed/performed   ? ?  ?  ? ?  ? ? ?Past Medical History:  ?Diagnosis Date  ? Broken foot 08/2014  ? left  ? [id number](440.1) 2016  ? migraine w/o aura  ? Hyperlipidemia   ? Low vitamin D level   ? Migraines   ? Plantar fasciitis of left foot   ? Stress fracture of left foot 07/2015  ? confirmed with MRI  ? ? ?Past Surgical History:  ?Procedure Laterality Date  ? INTRAUTERINE DEVICE (IUD) INSERTION    ? 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 INTRAUTERINE DEVICE INSERTION  12/04/2016  ? Kyleena   ? ? ?There were no vitals filed for this visit. ? ? Subjective Assessment - 04/25/21 0929   ? ? Subjective Patient reports that she is achey, trying th eexercises   ? Currently in Pain? Yes   ? Pain Score 3    ? Pain Location Foot   ? Aggravating Factors  morning pain at rest 3/10, walking 6/10   ? ?  ?  ? ?  ? ? ? ? ? ? ? ? ? ? ? ? ? ? ? ? ? ? ? ? [facility] Adult PT Treatment/Exercise - 04/25/21 0001   ? ?  ? Exercises  ? Exercises Ankle   ?  ? Modalities  ? Modalities Iontophoresis   ?  ? Iontophoresis  ? Type of Iontophoresis Dexamethasone   ? Location left lateral dorsal foot   ? Dose 44m   ? Time 4 hour patch   ?  ? Manual Therapy  ? Manual Therapy Soft tissue mobilization;Joint mobilization   ? Joint Mobilization toes, metatarsals and ankle   ? Soft tissue mobilization to the PF and the lateral dorsum of the foot   ?  ? Ankle  Exercises: Stretches  ? Soleus Stretch 3 reps;20 seconds   ? Gastroc Stretch 3 reps;20 seconds   ?  ? Ankle Exercises: Aerobic  ? Nustep level 4 x 5 minutes   ?  ? Ankle Exercises: Supine  ? T-Band red all motions   ? ?  ?  ? ?  ? ? ? ? ? ? ? ? ? ? ? ? PT Short Term Goals - 04/25/21 1157   ? ?  ? PT SHORT TERM GOAL #1  ? Title I with initial HEP   ? Status Partially Met   ? ?  ?  ? ?  ? ? ? ? PT Long Term Goals - 04/10/21 1255   ? ?  ? PT LONG TERM GOAL #1  ? Title I with final HEP   ? Time 10   ? Period Weeks   ? Status New   ? Target Date 06/19/21   ?  ? PT LONG TERM GOAL #2  ? Title Patient will be able  to perform 10 single limb heel raises on level ground with pain< 3/10   ? Baseline up to 8/10   ? Time 10   ? Period Weeks   ? Status New   ? Target Date 06/19/21   ?  ? PT LONG TERM GOAL #3  ? Title Patient will report morning pain and stiffness of <3/10 in L foot and ankle.   ? Baseline up to 8/10   ? Time 10   ? Period Weeks   ? Status New   ? Target Date 06/19/21   ?  ? PT LONG TERM GOAL #4  ? Title Patient will be able to walk x at least 1/2 mile with pain in L ankle <3/10.   ? Baseline Unable to walk for any distance without pain.   ? Time 10   ? Period Weeks   ? Status New   ? Target Date 06/19/21   ? ?  ?  ? ?  ? ? ? ? ? ? ? ? Plan - 04/25/21 1104   ? ? Clinical Impression Statement I added exercises and stretches, did some STM and passive stretch and joint mobilization.  She tolerated well, very tight calves, reported feeling better after the treatment, tried the ionto   ? PT Next Visit Plan Treat her tendonitis   ? Consulted and Agree with Plan of Care Patient   ? ?  ?  ? ?  ? ? ?Patient will benefit from skilled therapeutic intervention in order to improve the following deficits and impairments:  Increased edema, Decreased strength, Decreased mobility, Postural dysfunction, Impaired sensation, Improper body mechanics, Impaired flexibility, Pain, Difficulty walking, Decreased range of motion ? ?Visit  Diagnosis: ?Pain in left ankle and joints of left foot ? ?Muscle weakness (generalized) ? ?Difficulty in walking, not elsewhere classified ? ?Localized edema ? ? ? ? ?Problem List ?Patient Active Problem List  ? Diagnosis Date Noted  ? Rosacea 02/06/2021  ? Elevated LFTs 02/15/2020  ? Hyperlipidemia   ? Encounter for long-term current use of medication 10/23/2015  ? Obesity 10/23/2015  ? Vitamin D deficiency 02/12/2015  ? Chronic migraine w/o aura w/o status migrainosus, not intractable   ? ? Sumner Boast, PT ?04/25/2021, 11:59 AM ? ?[facility] ?[address] ?[address] ?[place], [place], [address] ?Phone: [phone]   Fax:  [phone] ? ?Name: Wendy Molina ?MRN: [id number] ?Date of Birth: 1985/02/10 ? ? ? ?

## 2021-04-28 ENCOUNTER — Ambulatory Visit: Payer: BC Managed Care – PPO | Attending: Podiatry | Admitting: Physical Therapy

## 2021-04-28 ENCOUNTER — Encounter: Payer: Self-pay | Admitting: Physical Therapy

## 2021-04-28 DIAGNOSIS — R600 Localized edema: Secondary | ICD-10-CM | POA: Insufficient documentation

## 2021-04-28 DIAGNOSIS — M6281 Muscle weakness (generalized): Secondary | ICD-10-CM | POA: Insufficient documentation

## 2021-04-28 DIAGNOSIS — M25572 Pain in left ankle and joints of left foot: Secondary | ICD-10-CM | POA: Diagnosis not present

## 2021-04-28 DIAGNOSIS — R262 Difficulty in walking, not elsewhere classified: Secondary | ICD-10-CM | POA: Insufficient documentation

## 2021-04-28 NOTE — Therapy (Signed)
[facility] ?[place] ?[address] ?[place], [place], [address] ?Phone: [phone]   Fax:  [phone] ? ?Physical Therapy Treatment ? ?Patient Details  ?Name: Wendy Molina ?MRN: [id number] ?Date of Birth: 08-02-1985 ?Referring Provider (PT): [place], [place] ? ? ?Encounter Date: 04/28/2021 ? ? PT End of Session - 04/28/21 1442   ? ? Visit Number 3   ? Date for PT Re-Evaluation 06/19/21   ? PT Start Time 1402   ? PT Stop Time 1441   ? PT Time Calculation (min) 39 min   ? Activity Tolerance Patient tolerated treatment well   ? Behavior During Therapy [facility] for tasks assessed/performed   ? ?  ?  ? ?  ? ? ?Past Medical History:  ?Diagnosis Date  ? Broken foot 08/2014  ? left  ? ALPFXTKW(409.7) 2016  ? migraine w/o aura  ? Hyperlipidemia   ? Low vitamin D level   ? Migraines   ? Plantar fasciitis of left foot   ? Stress fracture of left foot 07/2015  ? confirmed with MRI  ? ? ?Past Surgical History:  ?Procedure Laterality Date  ? INTRAUTERINE DEVICE (IUD) INSERTION    ? 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 INTRAUTERINE DEVICE INSERTION  12/04/2016  ? Kyleena   ? ? ?There were no vitals filed for this visit. ? ? Subjective Assessment - 04/28/21 1405   ? ? Subjective Doing OK, have only been a couple of times but it is feeling better. Patch helped.   ? Patient Stated Goals Patient would like to be able to walk with minimal pain.   ? Currently in Pain? Yes   ? Pain Score 4    ? Pain Location Foot   ? Pain Orientation Left   ? Pain Descriptors / Indicators Aching   ? Pain Type Chronic pain   ? ?  ?  ? ?  ? ? ? ? ? ? ? ? ? ? ? ? ? ? ? ? ? ? ? ? [place] Adult PT Treatment/Exercise - 04/28/21 0001   ? ?  ? Iontophoresis  ? Type of Iontophoresis Dexamethasone   ? Location left lateral dorsal foot   ? Dose 8m   ? Time 4 hour patch   ?  ? Manual Therapy  ? Manual Therapy Soft tissue mobilization   ? Joint Mobilization toes, metatarsals and ankle   ? Soft tissue mobilization to the PF and the lateral  dorsum of the foot   ?  ? Ankle Exercises: Stretches  ? Plantar Fascia Stretch 3 reps;30 seconds   ? Gastroc Stretch 2 reps;30 seconds   ?  ? Ankle Exercises: Standing  ? Heel Raises 10 reps   ? Toe Raise 10 reps   ? Heel Walk (Round Trip) 3 laps in // bars   ?  ? Ankle Exercises: Seated  ? Other Seated Ankle Exercises towel scrunches 2 rounds x2   ? Other Seated Ankle Exercises arch crunches 2x10   ? ?  ?  ? ?  ? ? ? ? ? ? ? ? ? ? PT Education - 04/28/21 1442   ? ? Education Details reviewed ionto precautions and patch removal time   ? Person(s) Educated Patient   ? Methods Explanation   ? Comprehension Verbalized understanding   ? ?  ?  ? ?  ? ? ? PT Short Term Goals - 04/25/21 1157   ? ?  ? PT SHORT TERM GOAL #1  ?  Title I with initial HEP   ? Status Partially Met   ? ?  ?  ? ?  ? ? ? ? PT Long Term Goals - 04/10/21 1255   ? ?  ? PT LONG TERM GOAL #1  ? Title I with final HEP   ? Time 10   ? Period Weeks   ? Status New   ? Target Date 06/19/21   ?  ? PT LONG TERM GOAL #2  ? Title Patient will be able to perform 10 single limb heel raises on level ground with pain< 3/10   ? Baseline up to 8/10   ? Time 10   ? Period Weeks   ? Status New   ? Target Date 06/19/21   ?  ? PT LONG TERM GOAL #3  ? Title Patient will report morning pain and stiffness of <3/10 in L foot and ankle.   ? Baseline up to 8/10   ? Time 10   ? Period Weeks   ? Status New   ? Target Date 06/19/21   ?  ? PT LONG TERM GOAL #4  ? Title Patient will be able to walk x at least 1/2 mile with pain in L ankle <3/10.   ? Baseline Unable to walk for any distance without pain.   ? Time 10   ? Period Weeks   ? Status New   ? Target Date 06/19/21   ? ?  ?  ? ?  ? ? ? ? ? ? ? ? Plan - 04/28/21 1442   ? ? Clinical Impression Statement Wendy Molina arrives today doing well, ionto patch did help to take the edge off of her pain. Kept working on some stretches and gently introduced some standing ankle strength activities, otherwise spent some time working on foot  intrinsic strength and finished with STM and ionto. Tolerated well, will continue efforts.   ? Personal Factors and Comorbidities Age;Fitness;Comorbidity 1   ? Comorbidities previous foot fracture, L.   ? Examination-Activity Limitations Reach Overhead;Locomotion Level;Transfers;Bend;Carry;Squat;Stairs;Stand   ? Examination-Participation Restrictions Meal Prep;Cleaning;Valla Leaver Work   ? Stability/Clinical Decision Making Evolving/Moderate complexity   ? Clinical Decision Making Moderate   ? Rehab Potential Good   ? PT Frequency 2x / week   ? PT Duration Other (comment)   ? PT Treatment/Interventions ADLs/Self Care Home Management;Cryotherapy;Electrical Stimulation;Contrast Bath;Ultrasound;Moist Heat;Iontophoresis 15m/ml Dexamethasone;Gait training;Stair training;Functional mobility training;Therapeutic activities;Therapeutic exercise;Balance training;Neuromuscular re-education;Manual techniques;Dry needling;Passive range of motion;Patient/family education   ? PT Next Visit Plan foot intrinsic strength, plantar fasciitis stretching, continue ionto?   ? PT Home Exercise Plan [phone]  ? Consulted and Agree with Plan of Care Patient   ? ?  ?  ? ?  ? ? ?Patient will benefit from skilled therapeutic intervention in order to improve the following deficits and impairments:  Increased edema, Decreased strength, Decreased mobility, Postural dysfunction, Impaired sensation, Improper body mechanics, Impaired flexibility, Pain, Difficulty walking, Decreased range of motion ? ?Visit Diagnosis: ?Pain in left ankle and joints of left foot ? ?Muscle weakness (generalized) ? ?Difficulty in walking, not elsewhere classified ? ?Localized edema ? ? ? ? ?Problem List ?Patient Active Problem List  ? Diagnosis Date Noted  ? Rosacea 02/06/2021  ? Elevated LFTs 02/15/2020  ? Hyperlipidemia   ? Encounter for long-term current use of medication 10/23/2015  ? Obesity 10/23/2015  ? Vitamin D deficiency 02/12/2015  ? Chronic migraine w/o aura w/o  status migrainosus, not intractable   ? ?KAnn Lions  PT, DPT, PN2  ? ?Supplemental Physical Therapist ?[facility]  ? ? ? ? ? ?[place] ?[place] ?[address] ?[place], [place], [address] ?Phone: [phone]   Fax:  [phone] ? ?Name: Wendy Molina ?MRN: [id number] ?Date of Birth: [birth year] ? ? ? ?

## 2021-04-30 ENCOUNTER — Encounter: Payer: Self-pay | Admitting: Physical Therapy

## 2021-04-30 ENCOUNTER — Ambulatory Visit: Payer: BC Managed Care – PPO | Admitting: Physical Therapy

## 2021-04-30 DIAGNOSIS — R262 Difficulty in walking, not elsewhere classified: Secondary | ICD-10-CM | POA: Diagnosis not present

## 2021-04-30 DIAGNOSIS — M6281 Muscle weakness (generalized): Secondary | ICD-10-CM

## 2021-04-30 DIAGNOSIS — M25572 Pain in left ankle and joints of left foot: Secondary | ICD-10-CM | POA: Diagnosis not present

## 2021-04-30 DIAGNOSIS — R600 Localized edema: Secondary | ICD-10-CM

## 2021-04-30 NOTE — Therapy (Signed)
[facility] ?[place] ?[address] ?[place], [place], [address] ?Phone: [phone]   Fax:  [phone] ? ?Physical Therapy Treatment ? ?Patient Details  ?Name: Wendy Molina ?MRN: [id number] ?Date of Birth: 10-02-1985 ?Referring Provider (PT): [place], [place] ? ? ?Encounter Date: 04/30/2021 ? ? PT End of Session - 04/30/21 1711   ? ? Visit Number 4   ? Date for PT Re-Evaluation 06/19/21   ? PT Start Time 1532   ? PT Stop Time 9767   ? PT Time Calculation (min) 41 min   ? Activity Tolerance Patient tolerated treatment well   ? Behavior During Therapy [facility] for tasks assessed/performed   ? ?  ?  ? ?  ? ? ?Past Medical History:  ?Diagnosis Date  ? Broken foot 08/2014  ? left  ? HALPFXTK(240.9) 2016  ? migraine w/o aura  ? Hyperlipidemia   ? Low vitamin D level   ? Migraines   ? Plantar fasciitis of left foot   ? Stress fracture of left foot 07/2015  ? confirmed with MRI  ? ? ?Past Surgical History:  ?Procedure Laterality Date  ? INTRAUTERINE DEVICE (IUD) INSERTION    ? 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 INTRAUTERINE DEVICE INSERTION  12/04/2016  ? Kyleena   ? ? ?There were no vitals filed for this visit. ? ? Subjective Assessment - 04/30/21 1535   ? ? Subjective I wasn't able to wear the right shoes at work, had to wear more professional shoes (slight heels) and it brought my pain up   ? Patient Stated Goals Patient would like to be able to walk with minimal pain.   ? Currently in Pain? Yes   ? Pain Score 5    ? Pain Location Foot   ? Pain Orientation Left   ? Pain Descriptors / Indicators Aching   ? ?  ?  ? ?  ? ? ? ? ? ? ? ? ? ? ? ? ? ? ? ? ? ? ? ? [place] Adult PT Treatment/Exercise - 04/30/21 0001   ? ?  ? Manual Therapy  ? Manual Therapy Soft tissue mobilization   ? Joint Mobilization toes, metatarsals and ankle   ? Soft tissue mobilization to the PF and the lateral dorsum of the foot   ?  ? Ankle Exercises: Aerobic  ? Recumbent Bike L3 x6 minutes   ?  ? Ankle Exercises: Standing  ?  Rocker Board --   x15 AP, x15 eversion/inversion  ? Heel Raises 10 reps   on foam pad  ? Toe Raise 10 reps   on foam pad  ?  ? Ankle Exercises: Stretches  ? Plantar Fascia Stretch 3 reps;30 seconds   ? Soleus Stretch 3 reps;30 seconds   ? Gastroc Stretch 30 seconds;3 reps   ? ?  ?  ? ?  ? ?BAPS board CW and CCW circles x5 each using other foot to help guide board/control ROM  ? ? ? ? ? ? ? ? PT Education - 04/30/21 1710   ? ? Education Details compression sleeves and socks to help manage edema   ? Person(s) Educated Patient   ? Methods Explanation   ? Comprehension Verbalized understanding   ? ?  ?  ? ?  ? ? ? PT Short Term Goals - 04/25/21 1157   ? ?  ? PT SHORT TERM GOAL #1  ? Title I with initial HEP   ? Status Partially Met   ? ?  ?  ? ?  ? ? ? ?  PT Long Term Goals - 04/10/21 1255   ? ?  ? PT LONG TERM GOAL #1  ? Title I with final HEP   ? Time 10   ? Period Weeks   ? Status New   ? Target Date 06/19/21   ?  ? PT LONG TERM GOAL #2  ? Title Patient will be able to perform 10 single limb heel raises on level ground with pain< 3/10   ? Baseline up to 8/10   ? Time 10   ? Period Weeks   ? Status New   ? Target Date 06/19/21   ?  ? PT LONG TERM GOAL #3  ? Title Patient will report morning pain and stiffness of <3/10 in L foot and ankle.   ? Baseline up to 8/10   ? Time 10   ? Period Weeks   ? Status New   ? Target Date 06/19/21   ?  ? PT LONG TERM GOAL #4  ? Title Patient will be able to walk x at least 1/2 mile with pain in L ankle <3/10.   ? Baseline Unable to walk for any distance without pain.   ? Time 10   ? Period Weeks   ? Status New   ? Target Date 06/19/21   ? ?  ?  ? ?  ? ? ? ? ? ? ? ? Plan - 04/30/21 1711   ? ? Clinical Impression Statement Wendy Molina arrives feeling OK today, foot is a little sore since she had to wear dress shoes for work. We progressed standing activities today, able to tolerate well but did have a little bit of extra soreness. Opted to defer ionto today and focused on manual techniques to  help reduce pain. Her foot is staying quite swollen, also discussed compression socks and sleeves that she could try from [facility].   ? Personal Factors and Comorbidities Age;Fitness;Comorbidity 1   ? Comorbidities previous foot fracture, L.   ? Examination-Activity Limitations Reach Overhead;Locomotion Level;Transfers;Bend;Carry;Squat;Stairs;Stand   ? Examination-Participation Restrictions Meal Prep;Cleaning;Valla Leaver Work   ? Stability/Clinical Decision Making Evolving/Moderate complexity   ? Clinical Decision Making Moderate   ? Rehab Potential Good   ? PT Frequency 2x / week   ? PT Duration Other (comment)   ? PT Treatment/Interventions ADLs/Self Care Home Management;Cryotherapy;Electrical Stimulation;Contrast Bath;Ultrasound;Moist Heat;Iontophoresis 20m/ml Dexamethasone;Gait training;Stair training;Functional mobility training;Therapeutic activities;Therapeutic exercise;Balance training;Neuromuscular re-education;Manual techniques;Dry needling;Passive range of motion;Patient/family education   ? PT Next Visit Plan foot intrinsic strength, plantar fasciitis stretching, continue ionto?   ? PT Home Exercise Plan [phone]  ? Consulted and Agree with Plan of Care Patient   ? ?  ?  ? ?  ? ? ?Patient will benefit from skilled therapeutic intervention in order to improve the following deficits and impairments:  Increased edema, Decreased strength, Decreased mobility, Postural dysfunction, Impaired sensation, Improper body mechanics, Impaired flexibility, Pain, Difficulty walking, Decreased range of motion ? ?Visit Diagnosis: ?Pain in left ankle and joints of left foot ? ?Muscle weakness (generalized) ? ?Difficulty in walking, not elsewhere classified ? ?Localized edema ? ? ? ? ?Problem List ?Patient Active Problem List  ? Diagnosis Date Noted  ? Rosacea 02/06/2021  ? Elevated LFTs 02/15/2020  ? Hyperlipidemia   ? Encounter for long-term current use of medication 10/23/2015  ? Obesity 10/23/2015  ? Vitamin D deficiency  02/12/2015  ? Chronic migraine w/o aura w/o status migrainosus, not intractable   ? ?Wilma Michaelson U PT, DPT, PN2  ? ?Supplemental  Physical Therapist ?[facility]  ? ? ? ? ? ?[place] ?[address] ?[address] ?[place], [place], [address] ?Phone: [phone]   Fax:  [phone] ? ?Name: Wendy Molina ?MRN: [id number] ?Date of Birth: [birth year] ? ? ? ?

## 2021-05-05 ENCOUNTER — Ambulatory Visit: Payer: BC Managed Care – PPO | Admitting: Physical Therapy

## 2021-05-05 ENCOUNTER — Encounter: Payer: Self-pay | Admitting: Physical Therapy

## 2021-05-05 DIAGNOSIS — R262 Difficulty in walking, not elsewhere classified: Secondary | ICD-10-CM | POA: Diagnosis not present

## 2021-05-05 DIAGNOSIS — M6281 Muscle weakness (generalized): Secondary | ICD-10-CM

## 2021-05-05 DIAGNOSIS — R600 Localized edema: Secondary | ICD-10-CM

## 2021-05-05 DIAGNOSIS — M25572 Pain in left ankle and joints of left foot: Secondary | ICD-10-CM

## 2021-05-05 NOTE — Therapy (Signed)
[facility] ?[place] ?[address] ?[place], [place], [address] ?Phone: [phone]   Fax:  [phone] ? ?Physical Therapy Treatment ? ?Patient Details  ?Name: Wendy Molina ?MRN: [id number] ?Date of Birth: 04/12/1985 ?Referring Provider (PT): [place], [place] ? ? ?Encounter Date: 05/05/2021 ? ? PT End of Session - 05/05/21 1750   ? ? Visit Number 5   ? Date for PT Re-Evaluation 06/19/21   ? PT Start Time 1702   ? PT Stop Time 1742   ? PT Time Calculation (min) 40 min   ? Activity Tolerance Patient tolerated treatment well   ? Behavior During Therapy [facility] for tasks assessed/performed   ? ?  ?  ? ?  ? ? ?Past Medical History:  ?Diagnosis Date  ? Broken foot 08/2014  ? left  ? VZCHYIFO(277.4) 2016  ? migraine w/o aura  ? Hyperlipidemia   ? Low vitamin D level   ? Migraines   ? Plantar fasciitis of left foot   ? Stress fracture of left foot 07/2015  ? confirmed with MRI  ? ? ?Past Surgical History:  ?Procedure Laterality Date  ? INTRAUTERINE DEVICE (IUD) INSERTION    ? 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 INTRAUTERINE DEVICE INSERTION  12/04/2016  ? Kyleena   ? ? ?There were no vitals filed for this visit. ? ? Subjective Assessment - 05/05/21 1705   ? ? Subjective I had a lazy weekend and didn't do much but I woke up yesterday in a lot of pain   ? Diagnostic tests MRI-Peroneal tendinitis of left lower extremity  Mild tendinosis of the juxtamalleolar segment of the peroneus longus an brevis tendons and minimal adjacent peroneal tenosynovitis without tear  mil/mod subcutaneous edema/soft tissue swelling within the medial L ankle   ? Patient Stated Goals Patient would like to be able to walk with minimal pain.   ? Currently in Pain? Yes   ? Pain Score 7    ? Pain Location Foot   ? Pain Orientation Left   ? Pain Descriptors / Indicators Sharp;Aching   ? Pain Type Acute pain   ? ?  ?  ? ?  ? ? ? ? ? ? ? ? ? ? ? ? ? ? ? ? ? ? ? ? [place] [place] Adult PT Treatment/Exercise - 05/05/21 0001   ? ?  ? Modalities   ? Modalities Ultrasound;Iontophoresis   ?  ? Ultrasound  ? Ultrasound Location dorsum and lateral side of L foot   ? Ultrasound Parameters 1.41mz x7 minutes continuous   ? Ultrasound Goals Edema;Pain   ?  ? Iontophoresis  ? Type of Iontophoresis Dexamethasone   ? Location left lateral dorsal foot   ? Dose 869m  ? Time 4 hour patch   ?  ? Ankle Exercises: Aerobic  ? Stationary Bike L4x6 minutes   ?  ? Ankle Exercises: Seated  ? Other Seated Ankle Exercises rocker board x20 PF/DF, x20 inversion/eversion; BAPs x10 CW and CCW   ? Other Seated Ankle Exercises paper ball pickup x2 rounds; arch crunches 2x10  super sets   ? ?  ?  ? ?  ? ? ? ? ? ? ? ? ? ? PT Education - 05/05/21 1749   ? ? Education Details ionto removal time, encouraged supportive footwear at all times   ? Person(s) Educated Patient   ? Methods Explanation   ? Comprehension Verbalized understanding   ? ?  ?  ? ?  ? ? ?  PT Short Term Goals - 04/25/21 1157   ? ?  ? PT SHORT TERM GOAL #1  ? Title I with initial HEP   ? Status Partially Met   ? ?  ?  ? ?  ? ? ? ? PT Long Term Goals - 04/10/21 1255   ? ?  ? PT LONG TERM GOAL #1  ? Title I with final HEP   ? Time 10   ? Period Weeks   ? Status New   ? Target Date 06/19/21   ?  ? PT LONG TERM GOAL #2  ? Title Patient will be able to perform 10 single limb heel raises on level ground with pain< 3/10   ? Baseline up to 8/10   ? Time 10   ? Period Weeks   ? Status New   ? Target Date 06/19/21   ?  ? PT LONG TERM GOAL #3  ? Title Patient will report morning pain and stiffness of <3/10 in L foot and ankle.   ? Baseline up to 8/10   ? Time 10   ? Period Weeks   ? Status New   ? Target Date 06/19/21   ?  ? PT LONG TERM GOAL #4  ? Title Patient will be able to walk x at least 1/2 mile with pain in L ankle <3/10.   ? Baseline Unable to walk for any distance without pain.   ? Time 10   ? Period Weeks   ? Status New   ? Target Date 06/19/21   ? ?  ?  ? ?  ? ? ? ? ? ? ? ? Plan - 05/05/21 1750   ? ? Clinical Impression  Statement [name] arrives today doing OK, she didn?t do a lot this weekend but is really having a lot of pain in her foot. We avoided standing activities today, focused on seated ROM and strength tasks and manual interventions instead. Also tried some [place] prior to ionto today too. Hopefully she will be feeling better and we can get back to progressing standing tasks next session.   ? Personal Factors and Comorbidities Age;Fitness;Comorbidity 1   ? Comorbidities previous foot fracture, L.   ? Examination-Activity Limitations Reach Overhead;Locomotion Level;Transfers;Bend;Carry;Squat;Stairs;Stand   ? Examination-Participation Restrictions Meal Prep;Cleaning;Valla Leaver Work   ? Stability/Clinical Decision Making Evolving/Moderate complexity   ? Clinical Decision Making Moderate   ? Rehab Potential Good   ? PT Frequency 2x / week   ? PT Duration Other (comment)   ? PT Treatment/Interventions ADLs/Self Care Home Management;Cryotherapy;Electrical Stimulation;Contrast Bath;Ultrasound;Moist Heat;Iontophoresis 79m/ml Dexamethasone;Gait training;Stair training;Functional mobility training;Therapeutic activities;Therapeutic exercise;Balance training;Neuromuscular re-education;Manual techniques;Dry needling;Passive range of motion;Patient/family education   ? PT Next Visit Plan foot intrinsic strength, plantar fasciitis stretching, continue ionto?   ? PT Home Exercise Plan [phone]  ? ?  ?  ? ?  ? ? ?Patient will benefit from skilled therapeutic intervention in order to improve the following deficits and impairments:  Increased edema, Decreased strength, Decreased mobility, Postural dysfunction, Impaired sensation, Improper body mechanics, Impaired flexibility, Pain, Difficulty walking, Decreased range of motion ? ?Visit Diagnosis: ?Pain in left ankle and joints of left foot ? ?Muscle weakness (generalized) ? ?Difficulty in walking, not elsewhere classified ? ?Localized edema ? ? ? ? ?Problem List ?Patient Active Problem List  ?  Diagnosis Date Noted  ? Rosacea 02/06/2021  ? Elevated LFTs 02/15/2020  ? Hyperlipidemia   ? Encounter for long-term current use of medication 10/23/2015  ?  Obesity 10/23/2015  ? Vitamin D deficiency 02/12/2015  ? Chronic migraine w/o aura w/o status migrainosus, not intractable   ? ?Niccolo Burggraf U PT, DPT, PN2  ? ?Supplemental Physical Therapist ?[facility]  ? ? ? ? ? ?[place] ?[place] ?[address] ?[place], [place], [address] ?Phone: [phone]   Fax:  [phone] ? ?Name: BILLIEJO SORTO ?MRN: [id number] ?Date of Birth: 1985-09-02 ? ? ? ?

## 2021-05-07 ENCOUNTER — Encounter: Payer: Self-pay | Admitting: Physical Therapy

## 2021-05-07 ENCOUNTER — Ambulatory Visit: Payer: BC Managed Care – PPO | Admitting: Physical Therapy

## 2021-05-07 DIAGNOSIS — R600 Localized edema: Secondary | ICD-10-CM

## 2021-05-07 DIAGNOSIS — M25572 Pain in left ankle and joints of left foot: Secondary | ICD-10-CM

## 2021-05-07 DIAGNOSIS — R262 Difficulty in walking, not elsewhere classified: Secondary | ICD-10-CM

## 2021-05-07 DIAGNOSIS — M6281 Muscle weakness (generalized): Secondary | ICD-10-CM | POA: Diagnosis not present

## 2021-05-07 NOTE — Therapy (Signed)
[facility] ?[place] ?[address] ?[place], [place], [address] ?Phone: [phone]   Fax:  [phone] ? ?Physical Therapy Treatment ? ?Patient Details  ?Name: Wendy Molina ?MRN: [id number] ?Date of Birth: 10/30/1985 ?Referring Provider (PT): [place], [place] ? ? ?Encounter Date: 05/07/2021 ? ? PT End of Session - 05/07/21 1727   ? ? Visit Number 6   ? Date for PT Re-Evaluation 06/19/21   ? PT Start Time 2919   ? PT Stop Time 1738   ? PT Time Calculation (min) 43 min   ? Activity Tolerance Patient tolerated treatment well   ? Behavior During Therapy [facility] for tasks assessed/performed   ? ?  ?  ? ?  ? ? ?Past Medical History:  ?Diagnosis Date  ? Broken foot 08/2014  ? left  ? TYOMAYOK(599.7) 2016  ? migraine w/o aura  ? Hyperlipidemia   ? Low vitamin D level   ? Migraines   ? Plantar fasciitis of left foot   ? Stress fracture of left foot 07/2015  ? confirmed with MRI  ? ? ?Past Surgical History:  ?Procedure Laterality Date  ? INTRAUTERINE DEVICE (IUD) INSERTION    ? 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 INTRAUTERINE DEVICE INSERTION  12/04/2016  ? Kyleena   ? ? ?There were no vitals filed for this visit. ? ? Subjective Assessment - 05/07/21 1658   ? ? Subjective When I leave here and the next day I usually feel better for a while, pain is a little more recently   ? Currently in Pain? Yes   ? Pain Score 6    ? Pain Location Foot   ? Pain Orientation Left   ? Pain Descriptors / Indicators Sore;Aching   ? Aggravating Factors  walking   ? ?  ?  ? ?  ? ? ? ? ? ? ? ? ? ? ? ? ? ? ? ? ? ? ? ? [facility] Adult PT Treatment/Exercise - 05/07/21 0001   ? ?  ? Modalities  ? Modalities Vasopneumatic   ?  ? Ultrasound  ? Ultrasound Location dorsum medial and lateral left foot with estim combo   ? Ultrasound Parameters 1.5 w/cm 2 100%   ? Ultrasound Goals Edema;Pain   ?  ? Iontophoresis  ? Type of Iontophoresis Dexamethasone   ? Location left lateral dorsal foot   ? Dose 29m   ? Time 4 hour patch   ?  ?  Vasopneumatic  ? Number Minutes Vasopneumatic  10 minutes   ? Vasopnuematic Location  Ankle   ? Vasopneumatic Pressure Medium   ? Vasopneumatic Temperature  37   ?  ? Manual Therapy  ? Manual Therapy Soft tissue mobilization   ? Joint Mobilization toes, metatarsals and ankle   ? Soft tissue mobilization to the PF and the lateral dorsum of the foot   ? ?  ?  ? ?  ? ? ? ? ? ? ? ? ? ? ? ? PT Short Term Goals - 04/25/21 1157   ? ?  ? PT SHORT TERM GOAL #1  ? Title I with initial HEP   ? Status Partially Met   ? ?  ?  ? ?  ? ? ? ? PT Long Term Goals - 05/07/21 1730   ? ?  ? PT LONG TERM GOAL #1  ? Title I with final HEP   ? Status On-going   ?  ? PT LONG TERM GOAL #  2  ? Title Patient will be able to perform 10 single limb heel raises on level ground with pain< 3/10   ? Status On-going   ?  ? PT LONG TERM GOAL #3  ? Title Patient will report morning pain and stiffness of <3/10 in L foot and ankle.   ? Status On-going   ?  ? PT LONG TERM GOAL #4  ? Title Patient will be able to walk x at least 1/2 mile with pain in L ankle <3/10.   ? Status On-going   ? ?  ?  ? ?  ? ? ? ? ? ? ? ? Plan - 05/07/21 1728   ? ? Clinical Impression Statement Patient was walking better but she continues to have pain, rated pain a 6/10, she reports maybe a little better when she leaves but it comes back, today is at the end of her day so she does have some visible edema.  I continued with the ionto and the [place] added estim with it and did the vaso   ? ?  ?  ? ?  ? ? ?Patient will benefit from skilled therapeutic intervention in order to improve the following deficits and impairments:  Increased edema, Decreased strength, Decreased mobility, Postural dysfunction, Impaired sensation, Improper body mechanics, Impaired flexibility, Pain, Difficulty walking, Decreased range of motion ? ?Visit Diagnosis: ?Pain in left ankle and joints of left foot ? ?Muscle weakness (generalized) ? ?Difficulty in walking, not elsewhere classified ? ?Localized  edema ? ? ? ? ?Problem List ?Patient Active Problem List  ? Diagnosis Date Noted  ? Rosacea 02/06/2021  ? Elevated LFTs 02/15/2020  ? Hyperlipidemia   ? Encounter for long-term current use of medication 10/23/2015  ? Obesity 10/23/2015  ? Vitamin D deficiency 02/12/2015  ? Chronic migraine w/o aura w/o status migrainosus, not intractable   ? ? Sumner Boast, PT ?05/07/2021, 5:35 PM ? ?[place] ?[address] [address] ?[place], [place], [address] ?Phone: [phone]   Fax:  [phone] ? ?Name: Wendy Molina ?MRN: [id number] ?Date of Birth: 05/24/1985 ? ? ? ?

## 2021-05-12 ENCOUNTER — Ambulatory Visit: Payer: BC Managed Care – PPO | Admitting: Physical Therapy

## 2021-05-12 ENCOUNTER — Encounter: Payer: Self-pay | Admitting: Physical Therapy

## 2021-05-12 DIAGNOSIS — M25572 Pain in left ankle and joints of left foot: Secondary | ICD-10-CM

## 2021-05-12 DIAGNOSIS — R600 Localized edema: Secondary | ICD-10-CM

## 2021-05-12 DIAGNOSIS — M6281 Muscle weakness (generalized): Secondary | ICD-10-CM

## 2021-05-12 DIAGNOSIS — R262 Difficulty in walking, not elsewhere classified: Secondary | ICD-10-CM | POA: Diagnosis not present

## 2021-05-12 NOTE — Therapy (Signed)
[facility] ?[place], [place], [address] ?Phone: [phone]   Fax:  [phone] ? ?Physical Therapy Treatment ? ?Patient Details  ?Name: Wendy Molina ?MRN: [id number] ?Date of Birth: 06-Mar-1985 ?Referring Provider (PT): [place], [place] ? ? ?Encounter Date: 05/12/2021 ? ? PT End of Session - 05/12/21 1634   ? ? Visit Number 7   ? Date for PT Re-Evaluation 06/19/21   ? PT Start Time [id number]   ? PT Stop Time [id number]   ? PT Time Calculation (min) 52 min   ? Activity Tolerance Patient tolerated treatment well   ? Behavior During Therapy [facility] for tasks assessed/performed   ? ?  ?  ? ?  ? ? ?Past Medical History:  ?Diagnosis Date  ? Broken foot 08/2014  ? left  ? [id number]) 2016  ? migraine w/o aura  ? Hyperlipidemia   ? Low vitamin D level   ? Migraines   ? Plantar fasciitis of left foot   ? Stress fracture of left foot 07/2015  ? confirmed with MRI  ? ? ?Past Surgical History:  ?Procedure Laterality Date  ? INTRAUTERINE DEVICE (IUD) INSERTION    ? 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 INTRAUTERINE DEVICE INSERTION  12/04/2016  ? [place]   ? ? ?There were no vitals filed for this visit. ? ? Subjective Assessment - 05/12/21 1553   ? ? Subjective It's all right, its hurting today, but not as much as last times   ? Currently in Pain? Yes   ? Pain Score 4    ? Pain Location Foot   ? Pain Orientation Left   ? ?  ?  ? ?  ? ? ? ? ? ? ? ? ? ? ? ? ? ? ? ? ? ? ? ? [facility] Adult PT Treatment/Exercise - 05/12/21 0001   ? ?  ? High Level Balance  ? High Level Balance Activities Side stepping   on balance beam x4 both sides  ?  ? Iontophoresis  ? Type of Iontophoresis Dexamethasone   ? Location left lateral dorsal foot   ? Dose 32mA   ? Time 4 hour patch   ?  ? Vasopneumatic  ? Number Minutes Vasopneumatic  10 minutes   ? Vasopnuematic Location  Ankle   ? Vasopneumatic Pressure Medium   ? Vasopneumatic Temperature  --   34  ?  ? Manual Therapy  ? Manual Therapy Soft tissue mobilization    ? Joint Mobilization toes, metatarsals and ankle   ? Soft tissue mobilization to the PF and the lateral dorsum of the foot   ?  ? Ankle Exercises: Aerobic  ? Recumbent Bike Lvl 1.9 x5 mins   ?  ? Ankle Exercises: Standing  ? Other Standing Ankle Exercises 30lb resisted gait 4 way x 3 each   ? Other Standing Ankle Exercises fwd step ups B LE 2x10   ? ?  ?  ? ?  ? ? ? ? ? ? ? ? ? ? ? ? PT Short Term Goals - 05/12/21 1635   ? ?  ? PT SHORT TERM GOAL #1  ? Title I with initial HEP   ? Status Achieved   ? ?  ?  ? ?  ? ? ? ? PT Long Term Goals - 05/07/21 1730   ? ?  ? PT LONG TERM GOAL #1  ? Title I with final HEP   ? Status On-going   ?  ?  PT LONG TERM GOAL #2  ? Title Patient will be able to perform 10 single limb heel raises on level ground with pain< 3/10   ? Status On-going   ?  ? PT LONG TERM GOAL #3  ? Title Patient will report morning pain and stiffness of <3/10 in L foot and ankle.   ? Status On-going   ?  ? PT LONG TERM GOAL #4  ? Title Patient will be able to walk x at least 1/2 mile with pain in L ankle <3/10.   ? Status On-going   ? ?  ?  ? ?  ? ? ? ? ? ? ? ? Plan - 05/12/21 1637   ? ? Clinical Impression Statement Pt enters session with less pain compared to last session. Pt able to tolerate a more active functional session with a slight increase in L foot pain. L foot edema noted with MT. Cue for eccentric control needed with resisted gait. Continued with ionto and vaso for edema and pain control   ? Personal Factors and Comorbidities Age;Fitness;Comorbidity 1   ? Comorbidities previous foot fracture, L.   ? Examination-Activity Limitations Reach Overhead;Locomotion Level;Transfers;Bend;Carry;Squat;Stairs;Stand   ? Examination-Participation Restrictions Meal Prep;Cleaning;Valla Leaver Work   ? Stability/Clinical Decision Making Evolving/Moderate complexity   ? Rehab Potential Good   ? PT Treatment/Interventions ADLs/Self Care Home Management;Cryotherapy;Electrical Stimulation;Contrast Bath;Ultrasound;Moist  Heat;Iontophoresis 4mg /ml Dexamethasone;Gait training;Stair training;Functional mobility training;Therapeutic activities;Therapeutic exercise;Balance training;Neuromuscular re-education;Manual techniques;Dry needling;Passive range of motion;Patient/family education   ? PT Next Visit Plan foot intrinsic strength, plantar fasciitis stretching, continue ionto?   ? ?  ?  ? ?  ? ? ?Patient will benefit from skilled therapeutic intervention in order to improve the following deficits and impairments:  Increased edema, Decreased strength, Decreased mobility, Postural dysfunction, Impaired sensation, Improper body mechanics, Impaired flexibility, Pain, Difficulty walking, Decreased range of motion ? ?Visit Diagnosis: ?Pain in left ankle and joints of left foot ? ?Muscle weakness (generalized) ? ?Difficulty in walking, not elsewhere classified ? ?Localized edema ? ? ? ? ?Problem List ?Patient Active Problem List  ? Diagnosis Date Noted  ? Rosacea 02/06/2021  ? Elevated LFTs 02/15/2020  ? Hyperlipidemia   ? Encounter for long-term current use of medication 10/23/2015  ? Obesity 10/23/2015  ? Vitamin D deficiency 02/12/2015  ? Chronic migraine w/o aura w/o status migrainosus, not intractable   ? ? ?Scot Jun, PTA ?05/12/2021, 4:45 PM ? ?[facility] ?[address] ?[place], [place], [address] ?Phone: [phone]   Fax:  [phone] ? ?Name: Wendy Molina ?MRN: [id number] ?Date of Birth: 08/29/1985 ? ? ? ?

## 2021-05-15 ENCOUNTER — Encounter: Payer: Self-pay | Admitting: Physical Therapy

## 2021-05-15 ENCOUNTER — Ambulatory Visit: Payer: BC Managed Care – PPO | Admitting: Physical Therapy

## 2021-05-15 DIAGNOSIS — M25572 Pain in left ankle and joints of left foot: Secondary | ICD-10-CM

## 2021-05-15 DIAGNOSIS — R600 Localized edema: Secondary | ICD-10-CM | POA: Diagnosis not present

## 2021-05-15 DIAGNOSIS — R262 Difficulty in walking, not elsewhere classified: Secondary | ICD-10-CM

## 2021-05-15 DIAGNOSIS — M6281 Muscle weakness (generalized): Secondary | ICD-10-CM

## 2021-05-15 NOTE — Therapy (Signed)
[facility] ?[address] ?[place], [place], [address] ?Phone: [phone]   Fax:  [phone] ? ?Physical Therapy Treatment ? ?Patient Details  ?Name: Wendy Molina ?MRN: [id number] ?Date of Birth: 03/09/1985 ?Referring Provider (PT): [place], [place] ? ? ?Encounter Date: 05/15/2021 ? ?PHYSICAL THERAPY DISCHARGE SUMMARY ? ?Visits from Start of Care: 8 ? ?Current functional level related to goals / functional outcomes: ?Does not show significant progress with PT, in agreement to return to MD for f/u recommendations  ?  ?Remaining deficits: ?See below  ?  ?Education / Equipment: ?See below   ? ?Patient agrees to discharge. Patient goals were not met. Patient is being discharged due to did not respond to therapy.  ? ? ? PT End of Session - 05/15/21 1538   ? ? Visit Number 8   ? Date for PT Re-Evaluation 06/19/21   ? PT Start Time [id number]   ? PT Stop Time 1530   ? PT Time Calculation (min) 45 min   ? Activity Tolerance Patient tolerated treatment well   ? Behavior During Therapy [facility] for tasks assessed/performed   ? ?  ?  ? ?  ? ? ?Past Medical History:  ?Diagnosis Date  ? Broken foot 08/2014  ? left  ? ULAGTXMI(680.3) 2016  ? migraine w/o aura  ? Hyperlipidemia   ? Low vitamin D level   ? Migraines   ? Plantar fasciitis of left foot   ? Stress fracture of left foot 07/2015  ? confirmed with MRI  ? ? ?Past Surgical History:  ?Procedure Laterality Date  ? INTRAUTERINE DEVICE (IUD) INSERTION    ? 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 INTRAUTERINE DEVICE INSERTION  12/04/2016  ? Kyleena   ? ? ?There were no vitals filed for this visit. ? ? Subjective Assessment - 05/15/21 1445   ? ? Subjective It might be doing a little better than when I started, its hard to tell, the pain goes up and down in intensity. Pain still moves around, sometimes its on the top, sometimes its on the bottom. Yesterday I was in bed most of the day, I really only got up to make dinner and its really hurting today. I  got some insoles to put in some shoes, I haven't put them in yet.   ? Diagnostic tests MRI-Peroneal tendinitis of left lower extremity  Mild tendinosis of the juxtamalleolar segment of the peroneus longus an brevis tendons and minimal adjacent peroneal tenosynovitis without tear  mil/mod subcutaneous edema/soft tissue swelling within the medial L ankle   ? Patient Stated Goals Patient would like to be able to walk with minimal pain.   ? Currently in Pain? Yes   ? Pain Score 7    ? Pain Location Foot   ? Pain Orientation Left   ? Pain Descriptors / Indicators Sharp;Aching   ? Pain Type Chronic pain   ? ?  ?  ? ?  ? ? ? ? ? [facility] PT Assessment - 05/15/21 0001   ? ?  ? Assessment  ? Medical Diagnosis L foot pain   ? Referring Provider (PT) [place], Max   ? Onset Date/Surgical Date --   chronic, 7 years  ? Next MD Visit going to schedule   ? Prior Therapy none   ?  ? Precautions  ? Precautions None   ?  ? Restrictions  ? Weight Bearing Restrictions No   ?  ? Balance Screen  ? Has the  patient fallen in the past 6 months Yes   ? How many times? 1   ?  ? Home Environment  ? Living Environment Private residence   ?  ? Prior Function  ? Level of Independence Independent   ? Vocation Full time employment   ? Vocation Requirements sits at a desk.   ? Leisure walking, travel   ?  ? Observation/Other Assessments  ? Observations swelling noted lateral L foot, very tender   ?  ? AROM  ? AROM Assessment Site Ankle   ? Right/Left Ankle Right;Left   ? Right Ankle Dorsiflexion 2   ? Right Ankle Plantar Flexion 62   ? Right Ankle Inversion 50   ? Right Ankle Eversion 25   ? Left Ankle Dorsiflexion 2   ? Left Ankle Plantar Flexion 60   ? Left Ankle Inversion 30   ? Left Ankle Eversion 30   ?  ? Strength  ? Strength Assessment Site Ankle   ? Right/Left Ankle Left;Right   ? Right Ankle Dorsiflexion 5/5   ? Right Ankle Inversion 5/5   ? Right Ankle Eversion 5/5   ? Left Ankle Dorsiflexion 4+/5   ? Left Ankle Inversion 5/5   ? Left Ankle  Eversion 5/5   ?  ? Palpation  ? Palpation comment TTP anterior tib, dorsal surface of L foot especially laterally   ? ?  ?  ? ?  ? ? ? ? ? ? ? ? ? ? ? ? ? ? ? ? [facility] Adult PT Treatment/Exercise - 05/15/21 0001   ? ?  ? Exercises  ? Exercises Ankle   ?  ? Ultrasound  ? Ultrasound Location dorsal lateral foot   ? Ultrasound Parameters 1.5w/cm2   ? Ultrasound Goals Edema;Pain   ?  ? Iontophoresis  ? Type of Iontophoresis Dexamethasone   ? Location left lateral dorsal foot   ? Dose 4m   ? Time 4 hour patch   ?  ? Ankle Exercises: Seated  ? Other Seated Ankle Exercises 4 way ankle strength x10 each red TB   ? Other Seated Ankle Exercises arch crunches 1x10   ? ?  ?  ? ?  ? ? ? ? ? ? ? ? ? ? PT Education - 05/15/21 1538   ? ? Education Details DC today, return to MD for f/u recommendations   ? Person(s) Educated Patient   ? Methods Explanation   ? Comprehension Verbalized understanding   ? ?  ?  ? ?  ? ? ? PT Short Term Goals - 05/15/21 1501   ? ?  ? PT SHORT TERM GOAL #1  ? Title I with initial HEP   ? Baseline 4/20- compliant   ? Time 3   ? Period Weeks   ? Status Achieved   ? ?  ?  ? ?  ? ? ? ? PT Long Term Goals - 05/15/21 1501   ? ?  ? PT LONG TERM GOAL #1  ? Title I with final HEP   ? Time 10   ? Period Weeks   ? Status On-going   ?  ? PT LONG TERM GOAL #2  ? Title Patient will be able to perform 10 single limb heel raises on level ground with pain< 3/10   ? Baseline 4/20- still painful   ? Time 10   ? Period Weeks   ? Status Partially Met   ?  ? PT LONG TERM  GOAL #3  ? Title Patient will report morning pain and stiffness of <3/10 in L foot and ankle.   ? Baseline 4/20- comes and goes   ? Time 10   ? Period Weeks   ? Status On-going   ?  ? PT LONG TERM GOAL #4  ? Title Patient will be able to walk x at least 1/2 mile with pain in L ankle <3/10.   ? Baseline 4/20- not sure if that's possible   ? Time 10   ? Period Weeks   ? Status On-going   ? ?  ?  ? ?  ? ? ? ? ? ? ? ? Plan - 05/15/21 1538   ? ? Clinical  Impression Statement Wendy Molina arrives today doing OK, she is having a lot more pain in her foot despite not being on her foot much at all yesterday. Pain patterns are inconsistent and difficult to predict. Objective measures do not show significant change, and she is also not making significant progress towards her goals. I think that at this point let?s refer back to the doctor for ongoing POC- we will be happy to see her in the future for any other problems with new MD referral.   ? Personal Factors and Comorbidities Age;Fitness;Comorbidity 1   ? Comorbidities previous foot fracture, L.   ? Examination-Activity Limitations Reach Overhead;Locomotion Level;Transfers;Bend;Carry;Squat;Stairs;Stand   ? Examination-Participation Restrictions Meal Prep;Cleaning;Valla Leaver Work   ? Stability/Clinical Decision Making Evolving/Moderate complexity   ? Clinical Decision Making Moderate   ? Rehab Potential Good   ? PT Frequency Other (comment)   DC today  ? PT Duration Other (comment)   DC today  ? PT Treatment/Interventions ADLs/Self Care Home Management;Cryotherapy;Electrical Stimulation;Contrast Bath;Ultrasound;Moist Heat;Iontophoresis 43m/ml Dexamethasone;Gait training;Stair training;Functional mobility training;Therapeutic activities;Therapeutic exercise;Balance training;Neuromuscular re-education;Manual techniques;Dry needling;Passive range of motion;Patient/family education   ? PT Next Visit Plan DC   ? PT Home Exercise Plan [phone]  ? Consulted and Agree with Plan of Care Patient   ? ?  ?  ? ?  ? ? ?Patient will benefit from skilled therapeutic intervention in order to improve the following deficits and impairments:  Increased edema, Decreased strength, Decreased mobility, Postural dysfunction, Impaired sensation, Improper body mechanics, Impaired flexibility, Pain, Difficulty walking, Decreased range of motion ? ?Visit Diagnosis: ?Pain in left ankle and joints of left foot ? ?Muscle weakness (generalized) ? ?Difficulty in  walking, not elsewhere classified ? ?Localized edema ? ? ? ? ?Problem List ?Patient Active Problem List  ? Diagnosis Date Noted  ? Rosacea 02/06/2021  ? Elevated LFTs 02/15/2020  ? Hyperlipidemia   ? Encounter for lon

## 2021-05-20 ENCOUNTER — Ambulatory Visit: Payer: BC Managed Care – PPO | Admitting: Podiatry

## 2021-05-20 DIAGNOSIS — I872 Venous insufficiency (chronic) (peripheral): Secondary | ICD-10-CM

## 2021-05-20 DIAGNOSIS — I890 Lymphedema, not elsewhere classified: Secondary | ICD-10-CM

## 2021-05-20 NOTE — Progress Notes (Signed)
She presents today states that her ankle is doing much better however she still having a lot of swelling in the forefoot and just some pain around the fourth and fifth metatarsal area and the medial first metatarsal phalangeal joint area. ? ?Objective: Pulses are palpable left.  Her forefoot is more swollen now than I ever seen it.  It appears to be more lymphedema she states that it does not go down at nighttime. ? ?Assessment: As lymphedema however we cannot rule out venous insufficiency. ? ?Plan: At this point were going to send her to [facility] for vascular ultrasounds for venous insufficiency.  Should these come back normal I will consider sending her to the lymphedema clinic.  I do think that is this lymphedema that is causing her pain. ?

## 2021-06-03 ENCOUNTER — Telehealth: Payer: Self-pay | Admitting: *Deleted

## 2021-06-03 NOTE — Telephone Encounter (Signed)
Patient calling for status of V& V study referral. ?Called [facility], had not received the referral.  ?Faxed the referral to their office per request,will contact patient to schedule today. ?

## 2021-06-13 ENCOUNTER — Ambulatory Visit (INDEPENDENT_AMBULATORY_CARE_PROVIDER_SITE_OTHER): Payer: BC Managed Care – PPO

## 2021-06-13 DIAGNOSIS — I890 Lymphedema, not elsewhere classified: Secondary | ICD-10-CM | POA: Diagnosis not present

## 2021-06-13 DIAGNOSIS — I872 Venous insufficiency (chronic) (peripheral): Secondary | ICD-10-CM | POA: Diagnosis not present

## 2021-07-24 ENCOUNTER — Telehealth: Payer: Self-pay | Admitting: *Deleted

## 2021-07-24 ENCOUNTER — Ambulatory Visit: Payer: BC Managed Care – PPO | Admitting: Podiatry

## 2021-07-24 DIAGNOSIS — I890 Lymphedema, not elsewhere classified: Secondary | ICD-10-CM

## 2021-07-24 NOTE — Addendum Note (Signed)
Addended by: Lottie Rater E on: 07/24/2021 01:17 PM   Modules accepted: Orders

## 2021-07-24 NOTE — Telephone Encounter (Signed)
See MyChart message

## 2021-08-07 ENCOUNTER — Telehealth: Payer: Self-pay | Admitting: Podiatry

## 2021-08-07 NOTE — Telephone Encounter (Signed)
Pt called about a referral to out pt therapy for her lymphedema and has not heard anything. Upon checking chart there was a referral in the system and I did give pt number to call.

## 2021-09-08 ENCOUNTER — Encounter: Payer: Self-pay | Admitting: Occupational Therapy

## 2021-09-08 ENCOUNTER — Ambulatory Visit: Payer: BC Managed Care – PPO | Attending: Podiatry | Admitting: Occupational Therapy

## 2021-09-08 DIAGNOSIS — I890 Lymphedema, not elsewhere classified: Secondary | ICD-10-CM | POA: Diagnosis not present

## 2021-09-08 DIAGNOSIS — M25572 Pain in left ankle and joints of left foot: Secondary | ICD-10-CM | POA: Insufficient documentation

## 2021-09-08 NOTE — Therapy (Signed)
OUTPATIENT PHYSICAL THERAPY LOWER EXTREMITY LYMPHEDEMA EVALUATION  Patient Name: Wendy Molina MRN: [id number] DOB:03-12-1985, 36 y.o., female Today's Date: 09/08/2021    Past Medical History:  Diagnosis Date   Broken foot 08/2014   left   Headache(784.0) 2016   migraine w/o aura   Hyperlipidemia    Low vitamin D level    Migraines    Plantar fasciitis of left foot    Stress fracture of left foot 07/2015   confirmed with MRI   Past Surgical History:  Procedure Laterality Date   INTRAUTERINE DEVICE (IUD) INSERTION     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/01/14   INTRAUTERINE DEVICE INSERTION  12/04/2016   Kyleena    Patient Active Problem List   Diagnosis Date Noted   Rosacea 02/06/2021   Elevated LFTs 02/15/2020   Hyperlipidemia    Encounter for long-term current use of medication 10/23/2015   Obesity 10/23/2015   Vitamin D deficiency 02/12/2015   Chronic migraine w/o aura w/o status migrainosus, not intractable     PCP: Natalia Leatherwood, DO  REFERRING PROVIDER: Ernestene Kiel, DPM  REFERRING DIAG: I89.0  THERAPY DIAG:  Lymphedema  Rationale for Evaluation and Treatment Rehabilitation  ONSET DATE: 09/08/21  SUBJECTIVE                                                                                                                                                                                           SUBJECTIVE STATEMENT: Wendy Molina is referred to Occupational Therapy by Ernestene Kiel, DPM, for evaluation and treatment of chronic, left lower extremity lymphedema and foot pain with initial onset after she broke her left foot 09/14/2024. The medical record reports she also had L foot stress fracture 08/15/2015.  Pt reports, "My R leg below the knee also swells a little sometimes, but it doesn't hurt like the left". At present Pt does not wear compression garments. She tells me she has had an orthotic in the past,  but it did not reduced pain or swelling in her L foot. It is  unclear if this  device was a custom orthotic, or an off-the-shelf shoe insert. Inserts and/ or orthotic are not available today for assessment.  Pt reports the swelling in her left foot and leg reduces overnight, but worsens as the day goes on. L foot and leg pain often wakes her up  at night. L foot swelling never resolves completely. Pt reports a hx of chronic plantar fasciitis. She reports her foot is in constant motion when seated. She demonstrates how she rests her left foot on the lateral side to avoid putting pressure on the bottom.  Pt explains that she sits all day for her job, but elevates her legs and feet as much as possible. Pt's goal for OT is to reduce swelling and pain in her L foot /leg so she can walk better and do more.   PERTINENT HISTORY:   08/2014  L foot fracture w subsequent onset of chronic foot and leg pain and swelling 2017 L foot stress fracture  chronic PF Hx Migrains  obesity ( BMI ~ 41 today based on height and weight in chart.  3/23 : MRI suggestive of peroneal tenosynovitis some capsulitis with edema in the fourth fifth tarsometatarsal joint area and a muscle strain in this area as well did not demonstrate any significant osteoarthritic change in the rear foot though it does demonstrate some in the first metatarsal phalangeal joint. Hx of chronic PF w/ lateral compensatory foot positioning when seated  to reduce weight on sole of foot. Pt's foot and leg in constant motion when seated during evaluation PAIN:  Are you having pain? Yes: NPRS scale: 4-5/10 Pain location: L heel, L lateral foot, "sometimes all over my foot on top"; extends to posterior calf Pain description: achy, sharp, shooting up my calf / leg,  Aggravating factors: standing > 5 minutes, walking > 15 minutes, dependent sitting > 20 minutes Relieving factors: elevation, movement, warm foot bath, Tylenol   PRECAUTIONS: Other: lymphedema precautions  WEIGHT BEARING RESTRICTIONS No  FALLS:  Has  patient fallen in last 6 months? 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  Mild, Stage  II,  L Lower Extremity  (foot) Lymphedema 2/2 chronic inflammation s/p L foot stress fracture in 2016; exacerbated by obesity. Suspect CVI  Skin  Description Hyper-Keratosis Peau' de Orange Shiny Tight Fibrotic/ Indurated Fatty Doughy Spongy/ boggy      x Fatty , spongy, possible ganglion???, at L lateral foot. Sensitive to palpation. Same  sort of mass on R foot, but smaller. Slight fibrosis base of L toes. and base of toes.       Skin dry Flaky WNL Macerated     x    Color Redness Varicosities Blanching Hemosiderin Stain Mottled    Faint bilaterally, L>R  absent      Odor Malodorous Yeast Fungal infection  WNL      x   Temperature Warm Cool wnl      x   Pitting Edema   1+ 2+ 3+ 4+ Non-pitting         x   Girth Symmetrical Asymmetrical                   Distribution    Mild L foot swelling at dorsal lateral side and midfoot.  Toes and sole spared foot    Stemmer Sign Positive Negative   Very Slight at base of L toes. Absent on R  Lymphorrhea History Of:  Present Absent     x    Wounds History Of Present Absent Venous Arterial Pressure Sheer     x        Signs of Infection Redness Warmth Erythema Acute Swelling Drainage Borders                     Sensation Light Touch Deep pressure Hypersensitivty   Present Impaired Present Impaired Absent Impaired   x  x  x     Nails WNL   Fungus nail dystrophy   x     Hair Growth Symmetrical Asymmetrical   x    Skin Creases Base of toes  Ankles   Base of Fingers knees       Abdominal pannus Thigh Lobules  Face/neck   Minimal base of L toes. Stemmer is very slightly positive.          SENSATION:L foot tingles when seated. L foot numbness when laying in bed   POSTURE: WNL   Intake FOTO Score: 67/100% function  LYMPHEDEMA LIFE IMPACT SCALE (LLIS): 45.59% - (The extent to which problems associated with lymphedema affected Pt's life over the past week)  BLE AROM: WNL  BLE Strength: WFL    BLE COMPARATIVE LIMB VOLUMETRICS: TBA at OT Rx visit 1  LANDMARK RIGHT  //23  R LEG (A-D) TBA at OT Rx visit 1  R THIGH (E-G) ml  R FULL LIMB (A-G) ml  Limb Volume differential (LVD)  %  Volume change since initial %  Volume change overall %  (Blank rows = not tested)  LANDMARK LEFT  //23  R LEG (A-D) TBA OT Rx visit 1  R THIGH (E-G) ml  R FULL LIMB (A-G) ml  Limb Volume differential (LVD)  %  Volume change since initial %  Volume change overall %  (Blank rows = not tested)   INFECTIONS: No hx cellulitis or wounds  GAIT: WNL  TODAY'S TREATMENT  Occupational Therapy Evaluation Pt education Lymphedema and recommended Rx course  PATIENT EDUCATION:  Education details: Provided Pt/ family education regarding lymphatic structure and function, etiology, onset patterns and stages of progression. Taught interaction between blood circulatory system and lymphatic circulation.Discussed  impact of gravity and co-morbidities on lymphatic function. Outlined Complete Decongestive Therapy (CDT)  as standard of care and provided in depth information regarding 4 primary components of Intensive and Self Management Phases, including Manual Lymph Drainage (MLD), compression wrapping and  garments, skin care, and therapeutic exercise. Pilar Plate discussion with re need for frequent attendance and high burden of care when caregiver is needed, impact of co morbidities. We discussed  the chronic, progressive nature of lymphedema and Importance of daily, ongoing LE self-care essential for limiting progression and infection risk. Discussed the demanding nature of CDT and importance of high compliance with all home program components for optimal outcome. Person educated: Patient Education method: explanation, demonstration, tactile and verbal cues, handout Education comprehension: verbalized understanding, returned demonstration, and needs further education   HOME EXERCISE PROGRAM: LLE lymphatic pumping therex ( proximal to distal); 1 set of 10 reps, in order, 2 x daily, including diaphragmatic breathing.  Stretches for flexibility Scar massage Icing Kinesiotaping  ASSESSMENT:  CLINICAL IMPRESSION: Patient is a 36 y.o. female presenting with mild, stage II, LLE lymphedema 2/2 chronic inflammatory processes after a remote (2016). Her condition is exacerbated by obesity. Pt presents with mild swelling in the midfoot starting at base of toes and a raised, fatty ,  fibrotic mass at L dorsal lateral foot. Ganglion? The same fatty pocket is palpable on the dorsal lateral R foot, but smaller over all by ~ 50%. Toes are spared. PT course was unsuccessful at reducing foot swelling and pain. L foot / leg pain and foot swelling limits functional ambulation and mobility, impairs ability to complete basic and instrumental ADLs , limits work and productive activities, limits leisure pursuits, social participation, body image and quality of life. She may benefit from a modified Intensive Phase course of Complete Decongestive Therapy to facilitate improved lymphatic function, to reduce chronic swelling and pain, to limit infection risk, to  improve functional performance o in all occupational domains, and to  limit pain and infection risk. Pt will learn to perform simple self manual lymphatic drainage (MLD) to reduce swelling. She'll use multilayer, gradient, compression bandaging, and if successful, be fit with appropriate compression garments (considering ccl 2  toe cap and anklet, ccl 2 ( 30-40 mmHg). We'll try kinesiotape for joint support and improved lymphatic drainage.Without skilled OT intervention for lymphedema care, the condition will progress and further functional decline is expected.  OBJECTIVE IMPAIRMENTS decreased activity tolerance, decreased knowledge of condition, decreased knowledge of use of DME, decreased mobility, difficulty walking, increased edema, increased fascial restrictions, impaired sensation, obesity, and pain.   ACTIVITY LIMITATIONS sitting, standing, squatting, sleeping, transfers, bed mobility, and dressing,  PARTICIPATION LIMITATIONS: impaired functional ambulation and mobiliy, impaired basic and instrumental ADLs, Impaired work and productive activities, limited leisure pursuits, impaired social participation. Impaired body image impaired basic and instrumental ADLs  PERSONAL FACTORS obesity exacerbates patient's functional outcome. Length of time since onset; no prior use of compression  REHAB POTENTIAL: Fair    EVALUATION COMPLEXITY: moderate due to length of time with unabated limb swelling and intractable pain  GOALS: Goals reviewed with patient? Yes  LONG TERM GOALS: Target date: 10/21/2021  Given this patient's Intake score of 45.59% on the Lymphedema Life Impact Scale (LLIS), patient will experience an increase of 10 % reduction in her perceived level of functional impairment resulting from  lymphedema to improve functional performance and quality of life (QOL). Baseline: 45.59% Goal status: INITIAL  2.  Given this patient's Intake score of 67/100% on the functional outcomes FOTO tool, patient will experience an increase in function of 5 points to improve  basic and instrumental ADLs performance, including lymphedema self-care. Baseline: 67/100% Goal status: INITIAL  3.   Pt will demonstrate understanding of lymphedema precautions and prevention strategies with modified independence using a printed reference to identify at least 5 precautions and discussing how s/he may implement them into daily life to reduce risk of progression and to limit infection risk. Baseline: Max A Goal status: INITIAL  4.  With modified independence (extra time)  Pt will be able to apply multilayer, ankle - knee length, compression wraps using gradient techniques to decrease limb volume, to limit infection risk, and to limit lymphedema progression.  Baseline: Dependent Goal status: INITIAL  5.  Using Complete Decongestive Therapy (CDT) components, including MLD, skin care, and compression,  at home and in the clinic, Pt will reduce L foot volume to a normalized level when compared w/ the unaffected R side to prevent  further lymphedema progression Baseline: Dependent Goal status: INITIAL  6.  Pt will report reduction in L foot pain from 4-5/ 10 to 3-4/10 by discharge to increase functional ambulation and mobility and to achieve increased QOL. Baseline: 4-5/10 Goal status: INITIAL   PLAN: OT FREQUENCY: 2x/week  OT DURATION: 4-6 weeks  PLANNED INTERVENTIONS: Therapeutic exercises, Patient/Family education, Self Care, DME instructions, Manual lymph drainage, Compression bandaging, Taping, Vasopneumatic device, and compression garments- consider toe cap and anklet  PLAN FOR NEXT SESSION:  BLE comparative limb volumetrics ankles to groin Apply knee length compression wraps, including toes , to LLE  Loel Dubonnet, MS, OTR/L, CLT-LANA 09/08/21 3:59 PM

## 2021-09-10 ENCOUNTER — Ambulatory Visit: Payer: BC Managed Care – PPO | Admitting: Occupational Therapy

## 2021-09-10 DIAGNOSIS — I890 Lymphedema, not elsewhere classified: Secondary | ICD-10-CM | POA: Diagnosis not present

## 2021-09-10 DIAGNOSIS — M25572 Pain in left ankle and joints of left foot: Secondary | ICD-10-CM | POA: Diagnosis not present

## 2021-09-10 NOTE — Therapy (Signed)
OUTPATIENT PHYSICAL THERAPY TREATMENT NOTE: LLE LYMPHEDEMA  Patient Name: Wendy Molina MRN: [id number] DOB:02/11/1985, 36 y.o., female Today's Date: 09/10/2021    Past Medical History:  Diagnosis Date   Broken foot 08/2014   left   Headache(784.0) 2016   migraine w/o aura   Hyperlipidemia    Low vitamin D level    Migraines    Plantar fasciitis of left foot    Stress fracture of left foot 07/2015   confirmed with MRI   Past Surgical History:  Procedure Laterality Date   INTRAUTERINE DEVICE (IUD) INSERTION     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/01/14   INTRAUTERINE DEVICE INSERTION  12/04/2016   Kyleena    Patient Active Problem List   Diagnosis Date Noted   Rosacea 02/06/2021   Elevated LFTs 02/15/2020   Hyperlipidemia    Encounter for long-term current use of medication 10/23/2015   Obesity 10/23/2015   Vitamin D deficiency 02/12/2015   Chronic migraine w/o aura w/o status migrainosus, not intractable     PCP: Natalia Leatherwood, DO  REFERRING PROVIDER: Ernestene Kiel, DPM  REFERRING DIAG: I89.0  THERAPY DIAG:  No diagnosis found.  Rationale for Evaluation and Treatment Rehabilitation  ONSET DATE: 09/08/21  SUBJECTIVE                                                                                                                                                                                           SUBJECTIVE STATEMENT: Wendy Bank [name] presents to OT for treatment of LLE lymphedema . Pt has no new complaints.. Pain is unchanged from initial evaluation.   PERTINENT HISTORY:   08/2014  L foot fracture w subsequent onset of chronic foot and leg pain and swelling 2017 L foot stress fracture  chronic PF Hx Migrains  obesity ( BMI ~ 41 today based on height and weight in chart.  3/23 MRI suggestive of peroneal tenosynovitis some capsulitis with edema in the fourth fifth tarsometatarsal joint area and a muscle strain in this area as well did not demonstrate any  significant osteoarthritic change in the rear foot though it does demonstrate some in the first metatarsal phalangeal joint. Hx of chronic PF w/ lateral compensatory foot positioning when seated  to reduce weight on sole of foot. Pt's foot and leg in constant motion when seated during evaluation PAIN:  Are you having pain? Yes: NPRS scale: 2/10 Pain location: L heel, L lateral foot, "sometimes all over my foot on top"; extends to posterior calf Pain description: achy, sharp, shooting up my calf / leg,  Aggravating factors: standing > 5 minutes, walking > 15 minutes, dependent sitting >  20 minutes Relieving factors: elevation, movement, warm foot bath, Tylenol  FOTO SCORE:  09/08/21 = 67%  LLIS SCORE:     09/08/21   45.59%   PRECAUTIONS: Other: lymphedema precautions  OBJECTIVE Treatment Provided Today:    BLE COMPARATIVE LIMB VOLUMETRICS: 09/10/21  LLE (Rx)  LEFT  L LEG (A-D) 2728.3 ml  L THIGH (E-G) 5919.1 ml  L FULL LIMB (A-G) 8647.4 ml  Limb Volume differential (LVD)  A-D LVD= 8.09%, L>R E-G LVD=9.2% R >L (rx) A-G LVD= 3.09%, R>L (WNL)  Volume change since initial %  Volume change overall %  (Blank rows = not tested)  RLE (dominant) RIGHT  R LEG (A-D) 2968.6 ml  R THIGH (E-G) 5419.2 ml  R FULL LIMB (A-G) 8387 ml  Limb Volume differential (LVD)  %  Volume change since initial %  Volume change overall %  (Blank rows = not tested)   TODAY'S TREATMENT  Initial BLE comparative limb volumetrics 2. LLE multilayer gradient compression wrapping from base of toes to popliteal fossa using 1 each , 8,9, and 10 cm wide short stretch bandage over .04 cm thick single layer of Rosidal foam over cotton stockinett.  3. PATIENT EDUCATION:  Education details: Provided Pt/ family education regarding lymphatic structure and function, etiology, onset patterns and stages of progression. Taught interaction between blood circulatory system and lymphatic circulation.Discussed  impact of  gravity and co-morbidities on lymphatic function. Outlined Complete Decongestive Therapy (CDT)  as standard of care and provided in depth information regarding 4 primary components of Intensive and Self Management Phases, including Manual Lymph Drainage (MLD), compression wrapping and garments, skin care, and therapeutic exercise. Pilar Plate discussion with re need for frequent attendance and high burden of care when caregiver is needed, impact of co morbidities. We discussed  the chronic, progressive nature of lymphedema and Importance of daily, ongoing LE self-care essential for limiting progression and infection risk. Discussed the demanding nature of CDT and importance of high compliance with all home program components for optimal outcome. Person educated: Patient Education method: explanation, demonstration, tactile and verbal cues, handout Education comprehension: verbalized understanding, returned demonstration, and needs further education   HOME EXERCISE PROGRAM: LLE lymphatic pumping therex ( proximal to distal); 1 set of 10 reps, in order, 2 x daily, including diaphragmatic breathing.  Stretches for flexibility Scar massage Icing Kinesiotaping  ASSESSMENT:  CLINICAL IMPRESSION: Initial comparative limb volumetrics reveal elevated limb volume generalized throughout the L leg. Swelling is not just concentrated in the foot alone. Compared to the R leg, the L leg is 8.09% greater in volume than the dominant R leg. Dominant limbs are typically 2-3 % larger , depending on occupational demands. The LR thigh is greater in volume than the L by 9.2%, and overall limb volume differential for both legs measures 3.09%, R>L. Pt tolerated knee length multilayer wraps in clinic and was able to get shoe on over bandages after we re laced it. Pt verbalized understanding of bandaging precautions. Cont as per POC.  OBJECTIVE IMPAIRMENTS decreased activity tolerance, decreased knowledge of condition, decreased  knowledge of use of DME, decreased mobility, difficulty walking, increased edema, increased fascial restrictions, impaired sensation, obesity, and pain.   ACTIVITY LIMITATIONS sitting, standing, squatting, sleeping, transfers, bed mobility, and dressing,  PARTICIPATION LIMITATIONS: impaired functional ambulation and mobiliy, impaired basic and instrumental ADLs, Impaired work and productive activities, limited leisure pursuits, impaired social participation. Impaired body image impaired basic and instrumental ADLs  PERSONAL FACTORS obesity exacerbates patient's functional outcome. Length of  time since onset; no prior use of compression  REHAB POTENTIAL: Fair     GOALS: Goals reviewed with patient? Yes  LONG TERM GOALS: Target date: 10/22/2021  Given this patient's Intake score of 45.59% on the Lymphedema Life Impact Scale (LLIS), patient will experience an increase of 10 % reduction in her perceived level of functional impairment resulting from  lymphedema to improve functional performance and quality of life (QOL). Baseline: 45.59% Goal status: INITIAL  2.  Given this patient's Intake score of 67/100% on the functional outcomes FOTO tool, patient will experience an increase in function of 5 points to improve basic and instrumental ADLs performance, including lymphedema self-care. Baseline: 67/100% Goal status: INITIAL  3.   Pt will demonstrate understanding of lymphedema precautions and prevention strategies with modified independence using a printed reference to identify at least 5 precautions and discussing how s/he may implement them into daily life to reduce risk of progression and to limit infection risk. Baseline: Max A Goal status: INITIAL  4.  With modified independence (extra time)  Pt will be able to apply multilayer, ankle - knee length, compression wraps using gradient techniques to decrease limb volume, to limit infection risk, and to limit lymphedema progression.  Baseline:  Dependent Goal status: INITIAL  5.  Using Complete Decongestive Therapy (CDT) components, including MLD, skin care, and compression,  at home and in the clinic, Pt will reduce L foot volume to a normalized level when compared w/ the unaffected R side to prevent  further lymphedema progression Baseline: Dependent Goal status: INITIAL  6.  Pt will report reduction in L foot pain from 4-5/ 10 to 3-4/10 by discharge to increase functional ambulation and mobility and to achieve increased QOL. Baseline: 4-5/10 Goal status: INITIAL   PLAN: OT FREQUENCY: 2x/week  OT DURATION: 4-6 weeks  PLANNED INTERVENTIONS: Therapeutic exercises, Patient/Family education, Self Care, DME instructions, Manual lymph drainage, Compression bandaging, Taping, Vasopneumatic device, and compression garments- consider toe cap and anklet  PLAN FOR NEXT SESSION:  BLE comparative limb volumetrics ankles to groin Apply knee length compression wraps, including toes , to LLE  Loel Dubonnet, MS, OTR/L, CLT-LANA 09/10/21 3:06 PM

## 2021-09-15 ENCOUNTER — Ambulatory Visit: Payer: BC Managed Care – PPO | Admitting: Occupational Therapy

## 2021-09-15 DIAGNOSIS — I890 Lymphedema, not elsewhere classified: Secondary | ICD-10-CM | POA: Diagnosis not present

## 2021-09-15 DIAGNOSIS — M25572 Pain in left ankle and joints of left foot: Secondary | ICD-10-CM | POA: Diagnosis not present

## 2021-09-15 NOTE — Therapy (Signed)
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/01/14   INTRAUTERINE DEVICE INSERTION  12/04/2016   [facility]    Patient Active Problem List   Diagnosis Date Noted   Rosacea 02/06/2021   Elevated LFTs 02/15/2020   Hyperlipidemia    Encounter for long-term current use of medication 10/23/2015   Obesity 10/23/2015   Vitamin D deficiency 02/12/2015   Chronic migraine w/o aura w/o status migrainosus, not intractable     PCP: Natalia Leatherwood, DO  REFERRING PROVIDER: Ernestene Kiel, DPM  REFERRING DIAG: I89.0  THERAPY DIAG:  Lymphedema  Rationale for Evaluation and Treatment Rehabilitation  ONSET DATE: 09/08/21  SUBJECTIVE                                                                                                                                                                                           SUBJECTIVE STATEMENT: [name] presents to OT for treatment of LLE lymphedema . Pt has no new complaints.Pt reports she had no difficulty tolerating compression wraps during interval, and this morning when she got out of bed her foot was pain free for the first time in quite some time. Pt is agreeable to learning self bandaging today.   PERTINENT HISTORY:   08/2014  L foot fracture w subsequent onset of chronic foot and leg pain and swelling 2017 L foot stress fracture  chronic PF Hx Migrains  obesity ( BMI ~ 41 today based on height and weight in chart.  3/23 MRI  suggestive of peroneal tenosynovitis some capsulitis with edema in the fourth fifth tarsometatarsal joint area and a muscle strain in this area as well did not demonstrate any significant osteoarthritic change in the rear foot though it does demonstrate some in the first metatarsal phalangeal joint. Hx of chronic PF w/ lateral compensatory foot positioning when seated  to reduce weight on sole of foot. Pt's foot and leg in constant motion when seated during evaluation  PAIN:  Are you having pain? Yes: NPRS scale: 2/10 Pain location: L heel, L lateral foot, "sometimes  all over my foot on top"; extends to posterior calf Pain description: achy, sharp, shooting up my calf / leg,  Aggravating factors: standing > 5 minutes, walking > 15 minutes, dependent sitting > 20 minutes Relieving factors: elevation, movement, warm foot bath, Tylenol  FOTO SCORE:  09/08/21 = 67%  LLIS SCORE:     09/08/21   45.59%   PRECAUTIONS: Other: lymphedema precautions  OBJECTIVE Treatment Provided Today:    BLE COMPARATIVE LIMB VOLUMETRICS: 09/10/21  LLE (Rx)  LEFT  L LEG (A-D) 2728.3 ml  L THIGH (E-G) 5919.1 ml  L FULL LIMB (A-G) 8647.4 ml  Limb Volume differential (LVD)  A-D LVD= 8.09%, L>R E-G LVD=9.2% R >L (rx) A-G LVD= 3.09%, R>L (WNL)  Volume change since initial %  Volume change overall %  (Blank rows = not tested)  RLE (dominant) RIGHT  R LEG (A-D) 2968.6 ml  R THIGH (E-G) 5419.2 ml  R FULL LIMB (A-G) 8387 ml  Limb Volume differential (LVD)  %  Volume change since initial %  Volume change overall %  (Blank rows = not tested)   TODAY'S TREATMENT   LLE multilayer gradient compression wrapping from base of toes to popliteal fossa using 1 each , 8,9, and 10 cm wide short stretch bandage over .04 cm thick single layer of Rosidal foam over cotton stockinett.  PATIENT/FAMILY EDUCATION:  EDU for lower extremity lymphatic pumping there ex. Handout provided. Education details: Emphasis of Pt edu on  multilayer short stretch compression wrapping vs long stretch wraps. How short stretch wraps move  lymphatic congestion by accentuating pumping action of leg muscles. Demonstrated application and educated on wear and care routines.By end of initial teaching Pt able to apply wraps using correct techniques with mod assist, including verbal cues. Person educated: Patient Education method: explanation, demonstration, tactile and verbal cues, handout Education comprehension: verbalized understanding, returned demonstration, and needs further education   Lymphedema Self-Care HOME  PROGRAM: LLE lymphatic pumping therex ( proximal to distal); 1 set of 10 reps, in order, 2 x daily, including diaphragmatic breathing.  Stretches for flexibility Scar massage Icing Kinesiotaping  ASSESSMENT:  CLINICAL IMPRESSION: Pt Pt tolerated LLE compression wraps after initial application for 24 hours. Pt reports pain relief  in foot upon waking after sleeping in wraps overnight. Visual assessment of swelling - it appears unchanged since last session. Bt end of session devoted to Pt edu for LE self compression wrapping Pt able to apply multilayer compression wrap from base of toes to below the knee on the affected R leg with a little extra time and a few intermittent verbal cues. We'll commence MLD next session. Stready progress to date. Cont as per POC.  OBJECTIVE IMPAIRMENTS decreased activity tolerance, decreased knowledge of condition, decreased knowledge of use of DME, decreased mobility, difficulty walking, increased edema, increased fascial restrictions, impaired sensation, obesity, and pain.   ACTIVITY LIMITATIONS sitting, standing, squatting, sleeping, transfers, bed mobility, and dressing,  PARTICIPATION LIMITATIONS: impaired functional ambulation and mobiliy, impaired basic and instrumental ADLs, Impaired work and productive activities, limited leisure pursuits, impaired social participation. Impaired body  image impaired basic and instrumental ADLs  PERSONAL FACTORS obesity exacerbates patient's functional outcome. Length of time since onset; no prior use of compression  REHAB POTENTIAL: Fair     GOALS: Goals reviewed with patient? Yes  LONG TERM GOALS: Target date: 10/27/2021  Given this patient's Intake score of 45.59% on the Lymphedema Life Impact Scale (LLIS), patient will experience an increase of 10 % reduction  in her perceived level of functional impairment resulting from  lymphedema to improve functional performance and quality of life (QOL). Baseline: 45.59% Goal status: INITIAL  2.  Given this patient's Intake score of 67/100% on the functional outcomes FOTO tool, patient will experience an increase in function of 5 points to improve basic and instrumental ADLs performance, including lymphedema self-care. Baseline: 67/100% Goal status: INITIAL  3.   Pt will demonstrate understanding of lymphedema precautions and prevention strategies with modified independence using a printed reference to identify at least 5 precautions and discussing how s/he may implement them into daily life to reduce risk of progression and to limit infection risk. Baseline: Max A Goal status: INITIAL  4.  With modified independence (extra time)  Pt will be able to apply multilayer, ankle - knee length, compression wraps using gradient techniques to decrease limb volume, to limit infection risk, and to limit lymphedema progression.  Baseline: Dependent Goal status: INITIAL  5.  Using Complete Decongestive Therapy (CDT) components, including MLD, skin care, and compression,  at home and in the clinic, Pt will reduce L foot volume to a normalized level when compared w/ the unaffected R side to prevent  further lymphedema progression Baseline: Dependent Goal status: INITIAL  6.  Pt will report reduction in L foot pain from 4-5/ 10 to 3-4/10 by discharge to increase functional ambulation and mobility and to  achieve increased QOL. Baseline: 4-5/10 Goal status: INITIAL   PLAN: OT FREQUENCY: 2x/week  OT DURATION: 4-6 weeks  PLANNED INTERVENTIONS: Therapeutic exercises, Patient/Family education, Self Care, DME instructions, Manual lymph drainage, Compression bandaging, Taping, Vasopneumatic device, and compression garments- consider toe cap and anklet  PLAN FOR NEXT SESSION:  BLE comparative limb volumetrics ankles to groin Apply knee length compression wraps, including toes , to LLE  Loel Dubonnet, MS, OTR/L, CLT-LANA 09/15/21 3:36 PM

## 2021-09-17 ENCOUNTER — Encounter: Payer: Self-pay | Admitting: Occupational Therapy

## 2021-09-17 ENCOUNTER — Ambulatory Visit: Payer: BC Managed Care – PPO | Admitting: Occupational Therapy

## 2021-09-17 DIAGNOSIS — I890 Lymphedema, not elsewhere classified: Secondary | ICD-10-CM | POA: Diagnosis not present

## 2021-09-17 DIAGNOSIS — M25572 Pain in left ankle and joints of left foot: Secondary | ICD-10-CM | POA: Diagnosis not present

## 2021-09-17 NOTE — Therapy (Signed)
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   INTRAUTERINE DEVICE INSERTION  12/04/2016   Wendy Molina    Patient Active Problem List   Diagnosis Date Noted   Rosacea 02/06/2021   Elevated LFTs 02/15/2020   Hyperlipidemia    Encounter for long-term current use of medication 10/23/2015   Obesity 10/23/2015   Vitamin D deficiency 02/12/2015   Chronic migraine w/o aura w/o status migrainosus, not intractable     PCP: Natalia Leatherwood, DO  REFERRING PROVIDER: Ernestene Kiel, DPM  REFERRING DIAG: I89.0  THERAPY DIAG:  Lymphedema  Rationale for Evaluation and Treatment Rehabilitation  ONSET DATE: 09/08/21  SUBJECTIVE                                                                                                                                                                                           SUBJECTIVE STATEMENT: Wendy Molina "Wendy Molina presents to OT for treatment of LLE lymphedema . Pt has no new complaints.Pt reports she had no difficulty tolerating compression wraps during interval, and this morning when she got out of bed her foot was pain free for the first time in quite some time. Pt is agreeable to learning self bandaging today.   PERTINENT HISTORY:   08/2014  L foot fracture w subsequent onset of chronic foot and leg pain and swelling 2017 L foot stress fracture  chronic PF Hx Migrains  obesity ( BMI ~ 41 today based on height and weight in chart.  3/23 MRI  suggestive of peroneal tenosynovitis some capsulitis with edema in the fourth fifth tarsometatarsal joint area and a muscle strain in this area as well did not demonstrate any significant osteoarthritic change in the rear foot though it does demonstrate some in the first metatarsal phalangeal joint. Hx of chronic PF w/ lateral compensatory foot positioning when seated  to reduce weight on sole of foot. Pt's foot and leg in constant motion when seated during evaluation  PAIN:  Are you having pain? Yes: NPRS scale: 2/10 Pain location: L heel, L lateral foot, "sometimes  all over my foot on top"; extends to posterior calf Pain description: achy, sharp, shooting up my calf / leg,  Aggravating factors: standing > 5 minutes, walking > 15 minutes, dependent sitting > 20 minutes Relieving factors: elevation, movement, warm foot bath, Tylenol  FOTO SCORE:  09/08/21 = 67%  LLIS SCORE:     09/08/21   45.59%   PRECAUTIONS: Other: lymphedema precautions  OBJECTIVE Treatment Provided Today:    BLE COMPARATIVE LIMB VOLUMETRICS: 09/10/21  LLE (Rx)  LEFT  L LEG (A-D) 2728.3 ml  L THIGH (E-G) 5919.1 ml  L FULL LIMB (A-G) 8647.4 ml  Limb Volume differential (LVD)  A-D LVD= 8.09%, L>R E-G LVD=9.2% R >L (rx) A-G LVD= 3.09%, R>L (WNL)  Volume change since initial %  Volume change overall %  (Blank rows = not tested)  RLE (dominant) RIGHT  R LEG (A-D) 2968.6 ml  R THIGH (E-G) 5419.2 ml  R FULL LIMB (A-G) 8387 ml  Limb Volume differential (LVD)  %  Volume change since initial %  Volume change overall %  (Blank rows = not tested)   TODAY'S TREATMENT   LLE multilayer gradient compression wrapping from base of toes to popliteal fossa using 1 each , 8,9, and 10 cm wide short stretch bandage over .04 cm thick single layer of Rosidal foam over cotton stockinett.  PATIENT/FAMILY EDUCATION:  Entry level teaching for MLD following lymphatic anatomy. Reviewed compression wrapping. Person educated:  Patient Education method: explanation, demonstration, tactile and verbal cues, handout Education comprehension: verbalized understanding, returned demonstration, and needs further education   Lymphedema Self-Care HOME  PROGRAM: LLE lymphatic pumping therex ( proximal to distal); 1 set of 10 reps, in order, 2 x daily, including diaphragmatic breathing.  Stretches for flexibility Scar massage Icing Kinesiotaping  ASSESSMENT:  CLINICAL IMPRESSION: Wendy Molina practiced compression wrapping between visits with moderate success. She applied bandages around base of toes too tightly and had to remove them over night. Still she attempted to wrap herself and persisted . Excellent progress. No change observed in foot swelling at start of session. But obvious reduction in foot swelling at dorsal foot afterwards as we were able to mobilize high protein fluid with deeper strokes.   Stready progress to date. Cont as per POC.  OBJECTIVE IMPAIRMENTS decreased activity tolerance, decreased knowledge of condition, decreased knowledge of use of DME, decreased mobility, difficulty walking, increased edema, increased fascial restrictions, impaired sensation, obesity, and pain.   ACTIVITY LIMITATIONS sitting, standing, squatting, sleeping, transfers, bed mobility, and dressing,  PARTICIPATION LIMITATIONS: impaired functional ambulation and mobiliy, impaired basic and instrumental ADLs, Impaired work and productive activities, limited leisure pursuits, impaired social participation. Impaired body image impaired basic and instrumental ADLs  PERSONAL FACTORS obesity exacerbates patient's functional outcome. Length of time since onset; no prior use of compression  REHAB POTENTIAL: Fair     GOALS: Goals reviewed with patient? Yes  LONG TERM GOALS: Target date: 10/29/2021  Given this patient's Intake score of 45.59% on the Lymphedema Life Impact Scale (LLIS), patient will experience an increase of 10 % reduction in her  perceived level of functional impairment resulting from  lymphedema to improve functional performance and quality of life (QOL). Baseline: 45.59% Goal status: INITIAL  2.  Given this patient's Intake score of 67/100% on the functional outcomes FOTO tool, patient will experience an increase in function of 5 points to improve basic and instrumental ADLs performance, including lymphedema self-care. Baseline: 67/100% Goal status: INITIAL  3.   Pt will demonstrate  understanding of lymphedema precautions and prevention strategies with modified independence using a printed reference to identify at least 5 precautions and discussing how s/he may implement them into daily life to reduce risk of progression and to limit infection risk. Baseline: Max A Goal status: INITIAL  4.  With modified independence (extra time)  Pt will be able to apply multilayer, ankle - knee length, compression wraps using gradient techniques to decrease limb volume, to limit infection risk, and to limit lymphedema progression.  Baseline: Dependent Goal status: INITIAL  5.  Using Complete Decongestive Therapy (CDT) components, including MLD, skin care, and compression,  at home and in the clinic, Pt will reduce L foot volume to a normalized level when compared w/ the unaffected R side to prevent  further lymphedema progression Baseline: Dependent Goal status: INITIAL  6.  Pt will report reduction in L foot pain from 4-5/ 10 to 3-4/10 by discharge to increase functional ambulation and mobility and to achieve increased QOL. Baseline: 4-5/10 Goal status: INITIAL   PLAN: OT FREQUENCY: 2x/week  OT DURATION: 4-6 weeks  PLANNED INTERVENTIONS: Therapeutic exercises, Patient/Family education, Self Care, DME instructions, Manual lymph drainage, Compression bandaging, Taping, Vasopneumatic device, and compression garments- consider toe cap and anklet  PLAN FOR NEXT SESSION:  BLE comparative limb volumetrics ankles to groin Apply  knee length compression wraps, including toes , to LLE  Loel Dubonnet, MS, OTR/L, CLT-LANA 09/17/21 3:14 PM

## 2021-09-22 ENCOUNTER — Ambulatory Visit: Payer: BC Managed Care – PPO

## 2021-09-22 DIAGNOSIS — M25572 Pain in left ankle and joints of left foot: Secondary | ICD-10-CM | POA: Diagnosis not present

## 2021-09-22 DIAGNOSIS — I890 Lymphedema, not elsewhere classified: Secondary | ICD-10-CM | POA: Diagnosis not present

## 2021-09-22 NOTE — Therapy (Signed)
OUTPATIENT PHYSICAL THERAPY TREATMENT NOTE: LLE LYMPHEDEMA  Patient Name: Wendy Molina MRN: [id number] DOB:May 24, 1985, 36 y.o., female Today's Date: 09/22/2021    OT End of Session - 09/22/21 1702     Visit Number 5    Number of Visits 36    Date for OT Re-Evaluation 12/07/21    Progress Note Due on Visit 10    OT Start Time 1600    OT Stop Time 1655    OT Time Calculation (min) 55 min    Activity Tolerance Patient tolerated treatment well;No increased pain    Behavior During Therapy [facility] for tasks assessed/performed              Past Medical History:  Diagnosis Date   Broken foot 08/2014   left   Headache(784.0) 2016   migraine w/o aura   Hyperlipidemia    Low vitamin D level    Migraines    Plantar fasciitis of left foot    Stress fracture of left foot 07/2015   confirmed with MRI   Past Surgical History:  Procedure Laterality Date   INTRAUTERINE DEVICE (IUD) INSERTION     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/01/14   INTRAUTERINE DEVICE INSERTION  12/04/2016   [facility]    Patient Active Problem List   Diagnosis Date Noted   Rosacea 02/06/2021   Elevated LFTs 02/15/2020   Hyperlipidemia    Encounter for long-term current use of medication 10/23/2015   Obesity 10/23/2015   Vitamin D deficiency 02/12/2015   Chronic migraine w/o aura w/o status migrainosus, not intractable     PCP: Natalia Leatherwood, DO  REFERRING PROVIDER: Ernestene Kiel, DPM  REFERRING DIAG: I89.0  THERAPY DIAG:  Lymphedema  Pain in left ankle and joints of left foot  Rationale for Evaluation and Treatment Rehabilitation  ONSET DATE: 09/08/21  SUBJECTIVE                                                                                                                                                                                           SUBJECTIVE STATEMENT: Pt states that she's understanding the wrapping sequence with her bandages but is still struggling to position herself the right ways to apply  bandages well and prevent dropping part of the roll.    PERTINENT HISTORY:   08/2014  L foot fracture w subsequent onset of chronic foot and leg pain and swelling 2017 L foot stress fracture  chronic PF Hx Migrains  obesity ( BMI ~ 41 today based on height and weight in chart.  3/23 MRI suggestive of peroneal tenosynovitis some capsulitis with edema in the fourth fifth tarsometatarsal joint area  and a muscle strain in this area as well did not demonstrate any significant osteoarthritic change in the rear foot though it does demonstrate some in the first metatarsal phalangeal joint. Hx of chronic PF w/ lateral compensatory foot positioning when seated  to reduce weight on sole of foot. Pt's foot and leg in constant motion when seated during evaluation  PAIN:  Are you having pain? Yes: NPRS scale: 3/10 Pain location: L heel, L lateral foot, "sometimes all over my foot on top"; extends to posterior calf Pain description: achy, sharp, shooting up my calf / leg,  Aggravating factors: standing > 5 minutes, walking > 15 minutes, dependent sitting > 20 minutes Relieving factors: elevation, movement, warm foot bath, Tylenol  FOTO SCORE:  09/08/21 = 67%  LLIS SCORE:     09/08/21   45.59%   PRECAUTIONS: Other: lymphedema precautions  OBJECTIVE Treatment Provided Today:    BLE COMPARATIVE LIMB VOLUMETRICS: 09/10/21  LLE (Rx)  LEFT  L LEG (A-D) 2728.3 ml  L THIGH (E-G) 5919.1 ml  L FULL LIMB (A-G) 8647.4 ml  Limb Volume differential (LVD)  A-D LVD= 8.09%, L>R E-G LVD=9.2% R >L (rx) A-G LVD= 3.09%, R>L (WNL)  Volume change since initial %  Volume change overall %  (Blank rows = not tested)  RLE (dominant) RIGHT  R LEG (A-D) 2968.6 ml  R THIGH (E-G) 5419.2 ml  R FULL LIMB (A-G) 8387 ml  Limb Volume differential (LVD)  %  Volume change since initial %  Volume change overall %  (Blank rows = not tested)   TODAY'S TREATMENT   Self Care: Reviewed LLE multilayer gradient compression  wrapping from base of toes to popliteal fossa using 1 each , 8,9, and 10 cm wide short stretch bandage over .04 cm thick single layer of Rosidal foam over cotton stockinett.  Pt required min vc for technique for increasing overlap of each bandage, and also tried figure 8 coverage today with foam layer to increase coverage over ankles.  Pt brought a new pair of velcro shoes that she found online, and was able to successfully don shoe with OT providing small extended piece of velcro to secure shoe. Tried various positions with foot rest to better reach L foot.  Pt found foot stool helpful when propping R foot on stool while crossing the L leg over R to more easily reach L foot for bandaging and pt states she does have a foot stool at home where she can practice this positioning.    Manual Therapy: OT reviewed/completed clearing of major lymph nodes at neck, inguinal nodes, posterior knee, deep abdominal nodes via diaphragmatic breathing x3 breaths with mod vc for technique.  Pt was able to return demo of clearing major nodes.  Performed MLD throughout LLE with more attention to dorsum of L foot and ankles with deeper strokes to these areas for mobilizing high protein fluid.  Reviewed MLD techniques with pt, including route, working proximal to distal, always moving fluid proximally with each skin stretch "away from toes."  Reviewed ankle pumps/circles/toe curls/toe spreading for further promoting lymphatic flow.  Pt able to return demo with min vc.   PATIENT/FAMILY EDUCATION:  Entry level teaching for MLD following lymphatic anatomy. Reviewed compression wrapping. Person educated: Patient Education method: explanation, demonstration, tactile and verbal cues, handout Education comprehension: verbalized understanding, returned demonstration, and needs further education   Lymphedema Self-Care HOME  PROGRAM: LLE lymphatic pumping therex ( proximal to distal); 1 set of 10 reps, in order,  2 x daily, including  diaphragmatic breathing.  Stretches for flexibility Scar massage Icing Kinesiotaping  ASSESSMENT:  CLINICAL IMPRESSION: Pt was instructed in MLD techniques including clearing major lymph nodes with good return demo.  Mod vc and demonstration for diaphragmatic breathing to clear deep abdominal nodes.  Explored a variety of positions today for optimal reach to bandage LLE.  Positioned bilat feet on footstool with L foot crossed over R knee with pt finding this positioning optimal to apply L foot bandages.  OT provided min-mod vc for bandaging technique for overlapping and starting with "snail up" with bandage roll.  Pt was able to manage a figure 8 with foam today for better ankle coverage and still fit into new velcro shoe with extended velcro piece.  Excellent progress and good tolerance to MLD and bandaging.  Slight reduction in foot swelling at dorsal foot by end of session after using deeper strokes to mobilize high protein fluid.  Steady progress to date. Cont as per POC.  OBJECTIVE IMPAIRMENTS decreased activity tolerance, decreased knowledge of condition, decreased knowledge of use of DME, decreased mobility, difficulty walking, increased edema, increased fascial restrictions, impaired sensation, obesity, and pain.   ACTIVITY LIMITATIONS sitting, standing, squatting, sleeping, transfers, bed mobility, and dressing,  PARTICIPATION LIMITATIONS: impaired functional ambulation and mobiliy, impaired basic and instrumental ADLs, Impaired work and productive activities, limited leisure pursuits, impaired social participation. Impaired body image impaired basic and instrumental ADLs  PERSONAL FACTORS obesity exacerbates patient's functional outcome. Length of time since onset; no prior use of compression  REHAB POTENTIAL: Fair     GOALS: Goals reviewed with patient? Yes  LONG TERM GOALS: Target date: 11/03/2021  Given this patient's Intake score of 45.59% on the Lymphedema Life Impact Scale  (LLIS), patient will experience an increase of 10 % reduction in her perceived level of functional impairment resulting from  lymphedema to improve functional performance and quality of life (QOL). Baseline: 45.59% Goal status: INITIAL  2.  Given this patient's Intake score of 67/100% on the functional outcomes FOTO tool, patient will experience an increase in function of 5 points to improve basic and instrumental ADLs performance, including lymphedema self-care. Baseline: 67/100% Goal status: INITIAL  3.   Pt will demonstrate understanding of lymphedema precautions and prevention strategies with modified independence using a printed reference to identify at least 5 precautions and discussing how s/he may implement them into daily life to reduce risk of progression and to limit infection risk. Baseline: Max A Goal status: INITIAL  4.  With modified independence (extra time)  Pt will be able to apply multilayer, ankle - knee length, compression wraps using gradient techniques to decrease limb volume, to limit infection risk, and to limit lymphedema progression.  Baseline: Dependent Goal status: INITIAL  5.  Using Complete Decongestive Therapy (CDT) components, including MLD, skin care, and compression,  at home and in the clinic, Pt will reduce L foot volume to a normalized level when compared w/ the unaffected R side to prevent  further lymphedema progression Baseline: Dependent Goal status: INITIAL  6.  Pt will report reduction in L foot pain from 4-5/ 10 to 3-4/10 by discharge to increase functional ambulation and mobility and to achieve increased QOL. Baseline: 4-5/10 Goal status: INITIAL   PLAN: OT FREQUENCY: 2x/week  OT DURATION: 4-6 weeks  PLANNED INTERVENTIONS: Therapeutic exercises, Patient/Family education, Self Care, DME instructions, Manual lymph drainage, Compression bandaging, Taping, Vasopneumatic device, and compression garments- consider toe cap and anklet  PLAN FOR  NEXT  SESSION:  BLE comparative limb volumetrics ankles to groin Apply knee length compression wraps, including toes , to LLE  Danelle Earthly, MS, OTR/L, CLT  Loel Dubonnet, MS, OTR/L, CLT-LANA 09/22/21 5:03 PM

## 2021-09-25 ENCOUNTER — Ambulatory Visit: Payer: BC Managed Care – PPO | Admitting: Occupational Therapy

## 2021-09-25 DIAGNOSIS — I890 Lymphedema, not elsewhere classified: Secondary | ICD-10-CM

## 2021-09-25 NOTE — Therapy (Signed)
OUTPATIENT PHYSICAL THERAPY TREATMENT NOTE: LLE LYMPHEDEMA  Patient Name: SUMAYYAH CUSTODIO MRN: [id number] DOB:07-Apr-1985, 36 y.o., female Today's Date: 09/25/2021    OT End of Session - 09/25/21 1011     Visit Number 6    Number of Visits 36    Date for OT Re-Evaluation 12/07/21    Progress Note Due on Visit 10    OT Start Time 1000    Activity Tolerance Patient tolerated treatment well;No increased pain    Behavior During Therapy [facility] for tasks assessed/performed              Past Medical History:  Diagnosis Date   Broken foot 08/2014   left   Headache(784.0) 2016   migraine w/o aura   Hyperlipidemia    Low vitamin D level    Migraines    Plantar fasciitis of left foot    Stress fracture of left foot 07/2015   confirmed with MRI   Past Surgical History:  Procedure Laterality Date   INTRAUTERINE DEVICE (IUD) INSERTION     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/01/14   INTRAUTERINE DEVICE INSERTION  12/04/2016   [facility]    Patient Active Problem List   Diagnosis Date Noted   Rosacea 02/06/2021   Elevated LFTs 02/15/2020   Hyperlipidemia    Encounter for long-term current use of medication 10/23/2015   Obesity 10/23/2015   Vitamin D deficiency 02/12/2015   Chronic migraine w/o aura w/o status migrainosus, not intractable     PCP: Natalia Leatherwood, DO  REFERRING PROVIDER: Ernestene Kiel, DPM  REFERRING DIAG: I89.0  THERAPY DIAG:  Lymphedema  Rationale for Evaluation and Treatment Rehabilitation  ONSET DATE: 09/08/21  SUBJECTIVE                                                                                                                                                                                           SUBJECTIVE STATEMENT: Pt states that she's understanding the wrapping sequence with her bandages but is still struggling to position herself the right ways to apply bandages well and prevent dropping part of the roll.    PERTINENT HISTORY:   08/2014  L foot fracture w  subsequent onset of chronic foot and leg pain and swelling 2017 L foot stress fracture  chronic PF Hx Migrains  obesity ( BMI ~ 41 today based on height and weight in chart.  3/23 MRI suggestive of peroneal tenosynovitis some capsulitis with edema in the fourth fifth tarsometatarsal joint area and a muscle strain in this area as well did not demonstrate any significant osteoarthritic change in the rear foot though it does demonstrate some in  the first metatarsal phalangeal joint. Hx of chronic PF w/ lateral compensatory foot positioning when seated  to reduce weight on sole of foot. Pt's foot and leg in constant motion when seated during evaluation  PAIN:  Are you having pain? Yes: NPRS scale: 3/10 Pain location: L heel, L lateral foot, "sometimes all over my foot on top"; extends to posterior calf Pain description: achy, sharp, shooting up my calf / leg,  Aggravating factors: standing > 5 minutes, walking > 15 minutes, dependent sitting > 20 minutes Relieving factors: elevation, movement, warm foot bath, Tylenol  FOTO SCORE:  09/08/21 = 67%  LLIS SCORE:     09/08/21   45.59%   PRECAUTIONS: Other: lymphedema precautions  OBJECTIVE Treatment Provided Today:    BLE COMPARATIVE LIMB VOLUMETRICS: 09/10/21  LLE (Rx)  LEFT  L LEG (A-D) 2728.3 ml  L THIGH (E-G) 5919.1 ml  L FULL LIMB (A-G) 8647.4 ml  Limb Volume differential (LVD)  A-D LVD= 8.09%, L>R E-G LVD=9.2% R >L (rx) A-G LVD= 3.09%, R>L (WNL)  Volume change since initial %  Volume change overall %  (Blank rows = not tested)  RLE (dominant) RIGHT  R LEG (A-D) 2968.6 ml  R THIGH (E-G) 5419.2 ml  R FULL LIMB (A-G) 8387 ml  Limb Volume differential (LVD)  %  Volume change since initial %  Volume change overall %  (Blank rows = not tested)   TODAY'S TREATMENT   Self Care: Reviewed LLE multilayer gradient compression wrapping from base of toes to popliteal fossa using 1 each , 8,9, and 10 cm wide short stretch bandage over  .04 cm thick single layer of Rosidal foam over cotton stockinett.  Pt required min vc for technique for increasing overlap of each bandage, and also tried figure 8 coverage today with foam layer to increase coverage over ankles.  Pt brought a new pair of velcro shoes that she found online, and was able to successfully don shoe with OT providing small extended piece of velcro to secure shoe. Tried various positions with foot rest to better reach L foot.  Pt found foot stool helpful when propping R foot on stool while crossing the L leg over R to more easily reach L foot for bandaging and pt states she does have a foot stool at home where she can practice this positioning.    Manual Therapy: OT reviewed/completed clearing of major lymph nodes at neck, inguinal nodes, posterior knee, deep abdominal nodes via diaphragmatic breathing x3 breaths with mod vc for technique.  Pt was able to return demo of clearing major nodes.  Performed MLD throughout LLE with more attention to dorsum of L foot and ankles with deeper strokes to these areas for mobilizing high protein fluid.  Reviewed MLD techniques with pt, including route, working proximal to distal, always moving fluid proximally with each skin stretch "away from toes."  Reviewed ankle pumps/circles/toe curls/toe spreading for further promoting lymphatic flow.  Pt able to return demo with min vc.   PATIENT/FAMILY EDUCATION:  Entry level teaching for MLD following lymphatic anatomy. Reviewed compression wrapping. Person educated: Patient Education method: explanation, demonstration, tactile and verbal cues, handout Education comprehension: verbalized understanding, returned demonstration, and needs further education   Lymphedema Self-Care HOME  PROGRAM: LLE lymphatic pumping therex ( proximal to distal); 1 set of 10 reps, in order, 2 x daily, including diaphragmatic breathing.  Stretches for flexibility Scar  massage Icing Kinesiotaping  ASSESSMENT:  CLINICAL IMPRESSION:   Continued  skilled teaching  for simple self- MLD techniques including clearing major lymph nodes with good return.  Mod vc and demonstration for diaphragmatic breathing to clear deep abdominal nodes  Slight reduction in foot swelling at dorsal foot by end of session after using deeper strokes to mobilize high protein fluid.  Steady progress to date. Cont as per POC.  OBJECTIVE IMPAIRMENTS decreased activity tolerance, decreased knowledge of condition, decreased knowledge of use of DME, decreased mobility, difficulty walking, increased edema, increased fascial restrictions, impaired sensation, obesity, and pain.   ACTIVITY LIMITATIONS sitting, standing, squatting, sleeping, transfers, bed mobility, and dressing,  PARTICIPATION LIMITATIONS: impaired functional ambulation and mobiliy, impaired basic and instrumental ADLs, Impaired work and productive activities, limited leisure pursuits, impaired social participation. Impaired body image impaired basic and instrumental ADLs  PERSONAL FACTORS obesity exacerbates patient's functional outcome. Length of time since onset; no prior use of compression  REHAB POTENTIAL: Fair     GOALS: Goals reviewed with patient? Yes  LONG TERM GOALS: Target date: 11/06/2021  Given this patient's Intake score of 45.59% on the Lymphedema Life Impact Scale (LLIS), patient will experience an increase of 10 % reduction in her perceived level of functional impairment resulting from  lymphedema to improve functional performance and quality of life (QOL). Baseline: 45.59% Goal status: INITIAL  2.  Given this patient's Intake score of 67/100% on the functional outcomes FOTO tool, patient will experience an increase in function of 5 points to improve basic and instrumental ADLs performance, including lymphedema self-care. Baseline: 67/100% Goal status: INITIAL  3.   Pt will demonstrate understanding of  lymphedema precautions and prevention strategies with modified independence using a printed reference to identify at least 5 precautions and discussing how s/he may implement them into daily life to reduce risk of progression and to limit infection risk. Baseline: Max A Goal status: INITIAL  4.  With modified independence (extra time)  Pt will be able to apply multilayer, ankle - knee length, compression wraps using gradient techniques to decrease limb volume, to limit infection risk, and to limit lymphedema progression.  Baseline: Dependent Goal status: INITIAL  5.  Using Complete Decongestive Therapy (CDT) components, including MLD, skin care, and compression,  at home and in the clinic, Pt will reduce L foot volume to a normalized level when compared w/ the unaffected R side to prevent  further lymphedema progression Baseline: Dependent Goal status: INITIAL  6.  Pt will report reduction in L foot pain from 4-5/ 10 to 3-4/10 by discharge to increase functional ambulation and mobility and to achieve increased QOL. Baseline: 4-5/10 Goal status: INITIAL   PLAN: OT FREQUENCY: 2x/week  OT DURATION: 4-6 weeks  PLANNED INTERVENTIONS: Therapeutic exercises, Patient/Family education, Self Care, DME instructions, Manual lymph drainage, Compression bandaging, Taping, Vasopneumatic device, and compression garments- consider toe cap and anklet  PLAN FOR NEXT SESSION:  BLE comparative limb volumetrics ankles to groin Apply knee length compression wraps, including toes , to LLE  Leta Speller, MS, OTR/L, CLT  Andrey Spearman, MS, OTR/L, CLT-LANA 09/25/21 10:12 AM

## 2021-10-01 ENCOUNTER — Ambulatory Visit: Payer: BC Managed Care – PPO | Attending: Podiatry | Admitting: Occupational Therapy

## 2021-10-01 DIAGNOSIS — I890 Lymphedema, not elsewhere classified: Secondary | ICD-10-CM | POA: Diagnosis not present

## 2021-10-01 NOTE — Therapy (Signed)
OUTPATIENT PHYSICAL THERAPY TREATMENT NOTE: LLE LYMPHEDEMA  Patient Name: Wendy Molina MRN: [id number] DOB:September 07, 1985, 36 y.o., female Today's Date: 10/01/2021    OT End of Session - 10/01/21 1603     Visit Number 7    Number of Visits 36    Date for OT Re-Evaluation 12/07/21    Progress Note Due on Visit 10    OT Start Time 0955    OT Stop Time 1105    OT Time Calculation (min) 70 min    Activity Tolerance Patient tolerated treatment well;No increased pain    Behavior During Therapy [facility] for tasks assessed/performed              Past Medical History:  Diagnosis Date   Broken foot 08/2014   left   Headache(784.0) 2016   migraine w/o aura   Hyperlipidemia    Low vitamin D level    Migraines    Plantar fasciitis of left foot    Stress fracture of left foot 07/2015   confirmed with MRI   Past Surgical History:  Procedure Laterality Date   INTRAUTERINE DEVICE (IUD) INSERTION     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/01/14   INTRAUTERINE DEVICE INSERTION  12/04/2016   Kyleena    Patient Active Problem List   Diagnosis Date Noted   Rosacea 02/06/2021   Elevated LFTs 02/15/2020   Hyperlipidemia    Encounter for long-term current use of medication 10/23/2015   Obesity 10/23/2015   Vitamin D deficiency 02/12/2015   Chronic migraine w/o aura w/o status migrainosus, not intractable     PCP: Natalia Leatherwood, DO  REFERRING PROVIDER: Ernestene Kiel, DPM  REFERRING DIAG: I89.0  THERAPY DIAG:  Lymphedema  Rationale for Evaluation and Treatment Rehabilitation  ONSET DATE: 09/08/21  SUBJECTIVE                                                                                                                                                                                           SUBJECTIVE STATEMENT: Pt presents to OT clinic for lymphedema care. Pt denies LE related leg pain. Pt has no new complaints.   PERTINENT HISTORY:   08/2014  L foot fracture w subsequent onset of chronic foot and  leg pain and swelling 2017 L foot stress fracture  chronic PF Hx Migrains  obesity ( BMI ~ 41 today based on height and weight in chart.  3/23 MRI suggestive of peroneal tenosynovitis some capsulitis with edema in the fourth fifth tarsometatarsal joint area and a muscle strain in this area as well did not demonstrate any significant osteoarthritic change in the rear foot though it does demonstrate  some in the first metatarsal phalangeal joint. Hx of chronic PF w/ lateral compensatory foot positioning when seated  to reduce weight on sole of foot. Pt's foot and leg in constant motion when seated during evaluation  PAIN:  Are you having pain? No, 0/10  FOTO SCORE:  09/08/21 = 67%  LLIS SCORE:     09/08/21   45.59%   PRECAUTIONS: Other: lymphedema precautions  OBJECTIVE Treatment Provided Today:    BLE COMPARATIVE LIMB VOLUMETRICS: 09/10/21  LLE (Rx)  LEFT  L LEG (A-D) 2728.3 ml  L THIGH (E-G) 5919.1 ml  L FULL LIMB (A-G) 8647.4 ml  Limb Volume differential (LVD)  A-D LVD= 8.09%, L>R E-G LVD=9.2% R >L (rx) A-G LVD= 3.09%, R>L (WNL)  Volume change since initial %  Volume change overall %  (Blank rows = not tested)  RLE (dominant) RIGHT  R LEG (A-D) 2968.6 ml  R THIGH (E-G) 5419.2 ml  R FULL LIMB (A-G) 8387 ml  Limb Volume differential (LVD)  %  Volume change since initial %  Volume change overall %  (Blank rows = not tested)   TODAY'S TREATMENT   Self Care: Reviewed LLE multilayer gradient compression wrapping from base of toes to popliteal fossa using 1 each , 8,9, and 10 cm wide short stretch bandage over .04 cm thick single layer of Rosidal foam over cotton stockinett.  Pt required min vc for technique for increasing overlap of each bandage, and also tried figure 8 coverage today with foam layer to increase coverage over ankles.  Pt brought a new pair of velcro shoes that she found online, and was able to successfully don shoe with OT providing small extended piece of  velcro to secure shoe. Tried various positions with foot rest to better reach L foot.  Pt found foot stool helpful when propping R foot on stool while crossing the L leg over R to more easily reach L foot for bandaging and pt states she does have a foot stool at home where she can practice this positioning.    Manual Therapy: MLD to LLE with emphasis on foot and distal leg using functional inguinal LN and typical sequence to non-cancer related lymphedema  PATIENT/FAMILY EDUCATION:  Entry level teaching for MLD following lymphatic anatomy. Reviewed compression wrapping. Person educated: Patient Education method: explanation, demonstration, tactile and verbal cues, handout Education comprehension: verbalized understanding, returned demonstration, and needs further education   Lymphedema Self-Care HOME  PROGRAM: LLE lymphatic pumping therex ( proximal to distal); 1 set of 10 reps, in order, 2 x daily, including diaphragmatic breathing.  Stretches for flexibility Scar massage Icing Kinesiotaping  ASSESSMENT:  CLINICAL IMPRESSION:   LLE slow to respopond to CDT. Although volume and tissue density reduction is observed after clinical MLD, no change in tissue condition and overall swelling presentation is noted to date. Pt agrees with plan to move forward towards compression garment fitting. Next visit we'll complete anatomical measurements for anklet style ccl 1 garment and ccl 2 toe piece. Considering Elvarex Classic for  passive massaging qualities. Cont as per POC.  OBJECTIVE IMPAIRMENTS decreased activity tolerance, decreased knowledge of condition, decreased knowledge of use of DME, decreased mobility, difficulty walking, increased edema, increased fascial restrictions, impaired sensation, obesity, and pain.   ACTIVITY LIMITATIONS sitting, standing, squatting, sleeping, transfers, bed mobility, and dressing,  PARTICIPATION LIMITATIONS: impaired functional ambulation and mobiliy, impaired  basic and instrumental ADLs, Impaired work and productive activities, limited leisure pursuits, impaired social participation. Impaired body image impaired basic  and instrumental ADLs  PERSONAL FACTORS obesity exacerbates patient's functional outcome. Length of time since onset; no prior use of compression  REHAB POTENTIAL: Fair     GOALS: Goals reviewed with patient? Yes  LONG TERM GOALS: Target date: 11/12/2021  Given this patient's Intake score of 45.59% on the Lymphedema Life Impact Scale (LLIS), patient will experience an increase of 10 % reduction in her perceived level of functional impairment resulting from  lymphedema to improve functional performance and quality of life (QOL). Baseline: 45.59% Goal status: INITIAL  2.  Given this patient's Intake score of 67/100% on the functional outcomes FOTO tool, patient will experience an increase in function of 5 points to improve basic and instrumental ADLs performance, including lymphedema self-care. Baseline: 67/100% Goal status: INITIAL  3.   Pt will demonstrate understanding of lymphedema precautions and prevention strategies with modified independence using a printed reference to identify at least 5 precautions and discussing how s/he may implement them into daily life to reduce risk of progression and to limit infection risk. Baseline: Max A Goal status: INITIAL  4.  With modified independence (extra time)  Pt will be able to apply multilayer, ankle - knee length, compression wraps using gradient techniques to decrease limb volume, to limit infection risk, and to limit lymphedema progression.  Baseline: Dependent Goal status: INITIAL  5.  Using Complete Decongestive Therapy (CDT) components, including MLD, skin care, and compression,  at home and in the clinic, Pt will reduce L foot volume to a normalized level when compared w/ the unaffected R side to prevent  further lymphedema progression Baseline: Dependent Goal status:  INITIAL  6.  Pt will report reduction in L foot pain from 4-5/ 10 to 3-4/10 by discharge to increase functional ambulation and mobility and to achieve increased QOL. Baseline: 4-5/10 Goal status: INITIAL   PLAN: OT FREQUENCY: 2x/week  OT DURATION: 4-6 weeks  PLANNED INTERVENTIONS: Therapeutic exercises, Patient/Family education, Self Care, DME instructions, Manual lymph drainage, Compression bandaging, Taping, Vasopneumatic device, and compression garments- consider toe cap and anklet  PLAN FOR NEXT SESSION:  BLE comparative limb volumetrics ankles to groin Apply knee length compression wraps, including toes , to LLE  Danelle Earthly, MS, OTR/L, CLT  Loel Dubonnet, MS, OTR/L, CLT-LANA 10/01/21 4:10 PM

## 2021-10-02 ENCOUNTER — Other Ambulatory Visit (HOSPITAL_COMMUNITY)
Admission: RE | Admit: 2021-10-02 | Discharge: 2021-10-02 | Disposition: A | Discharge status: Home / Self Care | Payer: BC Managed Care – PPO | Source: Ambulatory Visit | Attending: Obstetrics and Gynecology | Admitting: Obstetrics and Gynecology

## 2021-10-02 ENCOUNTER — Encounter: Payer: Self-pay | Admitting: Obstetrics and Gynecology

## 2021-10-02 ENCOUNTER — Ambulatory Visit (INDEPENDENT_AMBULATORY_CARE_PROVIDER_SITE_OTHER): Payer: BC Managed Care – PPO | Admitting: Obstetrics and Gynecology

## 2021-10-02 VITALS — BP 110/64 | HR 110 | Ht 66.0 in | Wt 266.0 lb

## 2021-10-02 DIAGNOSIS — Z113 Encounter for screening for infections with a predominantly sexual mode of transmission: Secondary | ICD-10-CM | POA: Diagnosis not present

## 2021-10-02 DIAGNOSIS — Z124 Encounter for screening for malignant neoplasm of cervix: Secondary | ICD-10-CM | POA: Diagnosis not present

## 2021-10-02 DIAGNOSIS — Z1159 Encounter for screening for other viral diseases: Secondary | ICD-10-CM

## 2021-10-02 DIAGNOSIS — N930 Postcoital and contact bleeding: Secondary | ICD-10-CM | POA: Diagnosis not present

## 2021-10-02 DIAGNOSIS — Z114 Encounter for screening for human immunodeficiency virus [HIV]: Secondary | ICD-10-CM

## 2021-10-02 DIAGNOSIS — Z01419 Encounter for gynecological examination (general) (routine) without abnormal findings: Secondary | ICD-10-CM

## 2021-10-02 NOTE — Progress Notes (Signed)
36 y.o. G0P0000 Single Caucasian female here for annual exam.    Patient has bleeding after intercourse x 1 day, occurring for about 6 months.  Occurs almost every time.  Some pain with intercourse.  Some dryness, but not a big change.  No partner change.   Normal pelvic ultrasound 07/2020 done for pelvic pain.  IUD in normal position.   Kyleena IUD expires in December, 2023.  She would like a new IUD.   Having lymphedema of left foot.   PCP: Felix Pacini, DO    No LMP recorded. (Menstrual status: IUD).     Period Cycle (Days):  (only has occ bleeding with Rutha Bouchard)     Sexually active: Yes.    The current method of family planning is IUD--Kyleena 01/07/17.    Exercising: No.  The patient does not participate in regular exercise at present. Smoker:  no  Health Maintenance: Pap:   09-17-17 Neg:Neg HR HPV, 08-14-14 Neg:Neg HR HPV, 08-01-13 Neg History of abnormal Pap:  no MMG:  n/a Colonoscopy:  n/a BMD:   n/a  Result  n/a TDaP:  06-22-12 Gardasil:   yes HIV:10-01-20 NR Hep C: 10-01-20 Neg Screening Labs: PCP   reports that she has never smoked. She has never used smokeless tobacco. She reports current alcohol use of about 5.0 - 6.0 standard drinks of alcohol per week. She reports that she does not use drugs.  Past Medical History:  Diagnosis Date   Broken foot 08/2014   left   Headache(784.0) 2016   migraine w/o aura   Hyperlipidemia    Low vitamin D level    Lymphedema    foot   Migraines    Plantar fasciitis of left foot    Stress fracture of left foot 07/2015   confirmed with MRI    Past Surgical History:  Procedure Laterality Date   INTRAUTERINE DEVICE (IUD) INSERTION     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/01/14   INTRAUTERINE DEVICE INSERTION  12/04/2016   Kyleena     Current Outpatient Medications  Medication Sig Dispense Refill   amitriptyline (ELAVIL) 50 MG tablet Take 1 tablet (50 mg total) by mouth at bedtime. 90 tablet 3   levonorgestrel (KYLEENA) 19.5 MG IUD by  Intrauterine route. Inserted 12-04-16     meloxicam (MOBIC) 15 MG tablet Take 1 tablet (15 mg total) by mouth daily. 30 tablet 3   metroNIDAZOLE (METROGEL) 1 % gel Apply topically daily. Apply thin layer of gel to affected area nightly until symptoms are resolved. 60 g 5   Multiple Vitamin (MULTIVITAMIN WITH MINERALS) TABS tablet Take 1 tablet by mouth daily.     zolmitriptan (ZOMIG-ZMT) 2.5 MG disintegrating tablet Take 1 tablet (2.5 mg total) by mouth as needed for migraine (may take repeat dose 2 hours later x1). 10 tablet 11   No current facility-administered medications for this visit.    Family History  Problem Relation Age of Onset   Arthritis Mother    Colitis Mother    Hemachromatosis Brother    Heart disease Maternal Aunt        MI 28-56   Lung cancer Maternal Grandmother        smoker   Cancer Maternal Grandmother        kidney   Alzheimer's disease Maternal Grandfather    Leukemia Paternal Grandmother        unsure correct type of cancer   Heart failure Paternal Grandmother    Kidney failure Paternal Grandfather        ?  dialysis    Review of Systems  Genitourinary:  Positive for vaginal bleeding (bleeding after intercourse;usually for 1 day).  All other systems reviewed and are negative.   Exam:   BP 110/64   Pulse (!) 110   Ht 5\' 6"  (1.676 m)   Wt 266 lb (120.7 kg)   SpO2 95%   BMI 42.93 kg/m     General appearance: alert, cooperative and appears stated age Head: normocephalic, without obvious abnormality, atraumatic Neck: no adenopathy, supple, symmetrical, trachea midline and thyroid normal to inspection and palpation Lungs: clear to auscultation bilaterally Breasts: normal appearance, no masses or tenderness, bilateral nipple inversion, no nipple discharge or bleeding, No axillary adenopathy Heart: regular rate and rhythm Abdomen: soft, non-tender; no masses, no organomegaly Extremities: extremities normal, atraumatic, no cyanosis or edema Skin: skin  color, texture, turgor normal. No rashes or lesions Lymph nodes: cervical, supraclavicular, and axillary nodes normal. Neurologic: grossly normal  Pelvic: External genitalia:  no lesions              No abnormal inguinal nodes palpated.              Urethra:  normal appearing urethra with no masses, tenderness or lesions              Bartholins and Skenes: normal                 Vagina: normal appearing vagina with normal color and discharge, no lesions              Cervix: friable cervix.  Ectropion noted.  IUD strings noted.               Pap taken: yes Bimanual Exam:  Uterus:  normal size, contour, position, consistency, mobility, non-tender              Adnexa: no mass, fullness, tenderness         Chaperone was present for exam:  , CMA  Assessment:   Well woman visit with gynecologic exam. Kyleena IUD.  Post coital bleeding.  Migraine HA without aura.  On Elavil. Bilateral nipple inversion.   Plan: Mammogram screening age 74.  Self breast awareness reviewed. Pap and HR HPV collected.  STD screening.  Guidelines for Calcium, Vitamin D, regular exercise program including cardiovascular and weight bearing exercise. IUD due for exchange this year.  Patient is aware.  She will decide if she wants 41 or Mirena.  Pelvic Palau is an option, but I think that the bleeding is due to the exocervix and less likely due to IUD position.  Follow up annually and prn.   After visit summary provided.

## 2021-10-02 NOTE — Patient Instructions (Signed)

## 2021-10-03 ENCOUNTER — Ambulatory Visit: Payer: BC Managed Care – PPO | Admitting: Occupational Therapy

## 2021-10-03 DIAGNOSIS — I890 Lymphedema, not elsewhere classified: Secondary | ICD-10-CM

## 2021-10-03 LAB — HEPATITIS C ANTIBODY: Hepatitis C Ab: NONREACTIVE

## 2021-10-03 LAB — HIV ANTIBODY (ROUTINE TESTING W REFLEX): HIV 1&2 Ab, 4th Generation: NONREACTIVE

## 2021-10-03 LAB — RPR: RPR Ser Ql: NONREACTIVE

## 2021-10-03 NOTE — Therapy (Signed)
OUTPATIENT PHYSICAL THERAPY TREATMENT NOTE: LLE LYMPHEDEMA  Patient Name: Wendy Molina MRN: [id number] DOB:Jul 09, 1985, 36 y.o., female Today's Date: 10/03/2021    OT End of Session - 10/03/21 1223     Visit Number 8    Number of Visits 36    Date for OT Re-Evaluation 12/07/21    Progress Note Due on Visit 10    OT Start Time 1110    OT Stop Time 1221    OT Time Calculation (min) 71 min    Activity Tolerance Patient tolerated treatment well;No increased pain    Behavior During Therapy [facility] for tasks assessed/performed              Past Medical History:  Diagnosis Date   Broken foot 08/2014   left   Headache(784.0) 2016   migraine w/o aura   Hyperlipidemia    Low vitamin D level    Lymphedema    foot   Migraines    Plantar fasciitis of left foot    Stress fracture of left foot 07/2015   confirmed with MRI   Past Surgical History:  Procedure Laterality Date   INTRAUTERINE DEVICE (IUD) INSERTION     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/01/14   INTRAUTERINE DEVICE INSERTION  12/04/2016   Kyleena    Patient Active Problem List   Diagnosis Date Noted   Rosacea 02/06/2021   Elevated LFTs 02/15/2020   Hyperlipidemia    Encounter for long-term current use of medication 10/23/2015   Obesity 10/23/2015   Vitamin D deficiency 02/12/2015   Chronic migraine w/o aura w/o status migrainosus, not intractable     PCP: Natalia Leatherwood, DO  REFERRING PROVIDER: Ernestene Kiel, DPM  REFERRING DIAG: I89.0  THERAPY DIAG:  Lymphedema  Rationale for Evaluation and Treatment Rehabilitation  ONSET DATE: 09/08/21  SUBJECTIVE                                                                                                                                                                                           SUBJECTIVE STATEMENT: Pt presents to OT clinic for lymphedema care. Pt denies LE related leg pain. Pt reports she had ring of swelling above the calf high compression wrap after last  session. She's agreeable to resuming knee length wrapping today. Pt reports foot pain is essentially unchanged. She does not rate pain numerically today.   PERTINENT HISTORY:   08/2014  L foot fracture w subsequent onset of chronic foot and leg pain and swelling 2017 L foot stress fracture  chronic PF Hx Migrains  obesity ( BMI ~ 41 today based on height and weight in chart.  3/23  MRI suggestive of peroneal tenosynovitis some capsulitis with edema in the fourth fifth tarsometatarsal joint area and a muscle strain in this area as well did not demonstrate any significant osteoarthritic change in the rear foot though it does demonstrate some in the first metatarsal phalangeal joint. Hx of chronic PF w/ lateral compensatory foot positioning when seated  to reduce weight on sole of foot. Pt's foot and leg in constant motion when seated during evaluation  PAIN:  Are you having pain? yes, not rated  FOTO SCORE:  09/08/21 = 67%  LLIS SCORE:     09/08/21   45.59%   PRECAUTIONS: Other: lymphedema precautions  OBJECTIVE Treatment Provided Today:    BLE COMPARATIVE LIMB VOLUMETRICS: 09/10/21  LLE (Rx)  LEFT  L LEG (A-D) 2728.3 ml  L THIGH (E-G) 5919.1 ml  L FULL LIMB (A-G) 8647.4 ml  Limb Volume differential (LVD)  A-D LVD= 8.09%, L>R E-G LVD=9.2% R >L (rx) A-G LVD= 3.09%, R>L (WNL)  Volume change since initial %  Volume change overall %  (Blank rows = not tested)  RLE (dominant) RIGHT  R LEG (A-D) 2968.6 ml  R THIGH (E-G) 5419.2 ml  R FULL LIMB (A-G) 8387 ml  Limb Volume differential (LVD)  %  Volume change since initial %  Volume change overall %  (Blank rows = not tested)   TODAY'S TREATMENT   Self Care: Pt education for simple self MLD, skin care and lymphatic pumping therex. Handouts given. Pt had opportunity to practice. Good return but needs additional teaching.  Pt edu for there ex and skin care. Handouts given. Pt edu for benefits and application of Kinesiotape for  fluid circulation and pain relief  Kinesiotape applied to volar surface of foot and distal leg, anchoring 2 " wide strip at medial calf in direction of medial lymph flow and extending    across anterior ankle , then fanning 1/2 fan cut fingers  parallel with metatarsals to base of toes. Also applied pattern typically used for plantar fassciitis     anchoring 2" wide tape and achilles origin and extending across heel. Instep and dividing 1/2" wide fan cuts across ball of foot. Lastly, anchored 2" wide solid tape under arch anchoring medially and laterally on top of foot.   Reviewed LLE multilayer gradient compression wrapping from base of toes to popliteal fossa using 1 each , 8,9, and 10 cm wide short stretch bandage over .04 cm thick single layer of Rosidal foam over cotton stockinett.  Pt required min vc for technique for increasing overlap of each bandage, and also tried figure 8 coverage today with foam layer to increase coverage over ankles.  Pt brought a new pair of velcro shoes that she found online, and was able to successfully don shoe with OT providing small extended piece of velcro to secure shoe. Tried various positions with foot rest to better reach L foot.  Pt found foot stool helpful when propping R foot on stool while crossing the L leg over R to more easily reach L foot for bandaging and pt states she does have a foot stool at home where she can practice this positioning.    Manual Therapy: MLD to LLE with emphasis on foot and distal leg using functional inguinal LN and typical sequence to non-cancer related lymphedema  PATIENT/FAMILY EDUCATION:  Pt edu for benefits and application of Kinesiotape for fluid circulation and pain relief Person educated: Patient Education method: explanation, demonstration, tactile and verbal cues, handout Education comprehension:  verbalized understanding, returned demonstration, and needs further education   Lymphedema Self-Care HOME  PROGRAM: LLE  lymphatic pumping therex ( proximal to distal); 1 set of 10 reps, in order, 2 x daily, including diaphragmatic breathing.  Stretches for flexibility Scar massage Icing Kinesiotaping  ASSESSMENT:  CLINICAL IMPRESSION:  After skilled teaching Pt demonstrated understanding of simple self MLD by being able to perform short neck sequence and J stroke and diaphragmatic breathing. Cont to review until proficient with entire sequence and fibrosis technique. Cont as per POC.Pt demonstrated goofd understanding of rational for impeccable skin care to limit infection risk, and of lymphatic pumping therex to facilitate increased lymphatic flow. Pt demonstrated understanding of rational and application of Kinesiotape as passive means to lymp massage. Pt give 3 sample pieces to practice with at [id number], and Pt given precautions for removal. Reapplied compression wraps as established resuming knee length application. Cont as epr POC.  OBJECTIVE IMPAIRMENTS decreased activity tolerance, decreased knowledge of condition, decreased knowledge of use of DME, decreased mobility, difficulty walking, increased edema, increased fascial restrictions, impaired sensation, obesity, and pain.   ACTIVITY LIMITATIONS sitting, standing, squatting, sleeping, transfers, bed mobility, and dressing,  PARTICIPATION LIMITATIONS: impaired functional ambulation and mobiliy, impaired basic and instrumental ADLs, Impaired work and productive activities, limited leisure pursuits, impaired social participation. Impaired body image impaired basic and instrumental ADLs  PERSONAL FACTORS obesity exacerbates patient's functional outcome. Length of time since onset; no prior use of compression  REHAB POTENTIAL: Fair     GOALS: Goals reviewed with patient? Yes  LONG TERM GOALS: Target date: 11/14/2021  Given this patient's Intake score of 45.59% on the Lymphedema Life Impact Scale (LLIS), patient will experience an increase of 10 % reduction  in her perceived level of functional impairment resulting from  lymphedema to improve functional performance and quality of life (QOL). Baseline: 45.59% Goal status: INITIAL  2.  Given this patient's Intake score of 67/100% on the functional outcomes FOTO tool, patient will experience an increase in function of 5 points to improve basic and instrumental ADLs performance, including lymphedema self-care. Baseline: 67/100% Goal status: INITIAL  3.   Pt will demonstrate understanding of lymphedema precautions and prevention strategies with modified independence using a printed reference to identify at least 5 precautions and discussing how s/he may implement them into daily life to reduce risk of progression and to limit infection risk. Baseline: Max A Goal status: INITIAL  4.  With modified independence (extra time)  Pt will be able to apply multilayer, ankle - knee length, compression wraps using gradient techniques to decrease limb volume, to limit infection risk, and to limit lymphedema progression.  Baseline: Dependent Goal status: INITIAL  5.  Using Complete Decongestive Therapy (CDT) components, including MLD, skin care, and compression,  at home and in the clinic, Pt will reduce L foot volume to a normalized level when compared w/ the unaffected R side to prevent  further lymphedema progression Baseline: Dependent Goal status: INITIAL  6.  Pt will report reduction in L foot pain from 4-5/ 10 to 3-4/10 by discharge to increase functional ambulation and mobility and to achieve increased QOL. Baseline: 4-5/10 Goal status: INITIAL   PLAN: OT FREQUENCY: 2x/week  OT DURATION: 4-6 weeks  PLANNED INTERVENTIONS: Therapeutic exercises, Patient/Family education, Self Care, DME instructions, Manual lymph drainage, Compression bandaging, Taping, Vasopneumatic device, and compression garments- consider toe cap and anklet  PLAN FOR NEXT SESSION:  MLD as established.  Review Kinesiotape  effectiveness Review self MLD, there ex  and skin care Apply knee length compression wraps, including toes , to LLE  Loel Dubonnet, MS, OTR/L, CLT-LANA 10/03/21 12:39 PM

## 2021-10-07 ENCOUNTER — Ambulatory Visit: Payer: BC Managed Care – PPO | Admitting: Occupational Therapy

## 2021-10-07 DIAGNOSIS — I890 Lymphedema, not elsewhere classified: Secondary | ICD-10-CM

## 2021-10-07 NOTE — Therapy (Signed)
OUTPATIENT PHYSICAL THERAPY TREATMENT NOTE: LLE LYMPHEDEMA  Patient Name: Wendy Molina MRN: [id number] DOB:July 29, 1985, 36 y.o., female Today's Date: 10/08/2021    OT End of Session - 10/07/21 1111     Visit Number 9    Number of Visits 36    Date for OT Re-Evaluation 12/07/21    Progress Note Due on Visit 10    OT Start Time 1100    OT Stop Time 1215    OT Time Calculation (min) 75 min    Activity Tolerance Patient tolerated treatment well;No increased pain    Behavior During Therapy [facility] for tasks assessed/performed              Past Medical History:  Diagnosis Date   Broken foot 08/2014   left   Headache(784.0) 2016   migraine w/o aura   Hyperlipidemia    Low vitamin D level    Lymphedema    foot   Migraines    Plantar fasciitis of left foot    Stress fracture of left foot 07/2015   confirmed with MRI   Past Surgical History:  Procedure Laterality Date   INTRAUTERINE DEVICE (IUD) INSERTION     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/01/14   INTRAUTERINE DEVICE INSERTION  12/04/2016   Kyleena    Patient Active Problem List   Diagnosis Date Noted   Rosacea 02/06/2021   Elevated LFTs 02/15/2020   Hyperlipidemia    Encounter for long-term current use of medication 10/23/2015   Obesity 10/23/2015   Vitamin D deficiency 02/12/2015   Chronic migraine w/o aura w/o status migrainosus, not intractable     PCP: Natalia Leatherwood, DO  REFERRING PROVIDER: Ernestene Kiel, DPM  REFERRING DIAG: I89.0  THERAPY DIAG:  Lymphedema  Rationale for Evaluation and Treatment Rehabilitation  ONSET DATE: 09/08/21  SUBJECTIVE                                                                                                                                                                                           SUBJECTIVE STATEMENT: Pt presents to OT clinic for lymphedema care. Pt reports foot pain is essentially unchanged. She does not rate pain numerically today.Pt is agreeable to undergoing  comparative limb volumetrics today in prep for upcoming progress report.   PERTINENT HISTORY:   08/2014  L foot fracture w subsequent onset of chronic foot and leg pain and swelling 2017 L foot stress fracture  chronic PF Hx Migrains  obesity ( BMI ~ 41 today based on height and weight in chart.  3/23 MRI suggestive of peroneal tenosynovitis some capsulitis with edema in the fourth fifth tarsometatarsal joint area  and a muscle strain in this area as well did not demonstrate any significant osteoarthritic change in the rear foot though it does demonstrate some in the first metatarsal phalangeal joint. Hx of chronic PF w/ lateral compensatory foot positioning when seated  to reduce weight on sole of foot. Pt's foot and leg in constant motion when seated during evaluation  PAIN:  Are you having pain? yes, not rated  FOTO SCORE:  09/08/21 = 67%  LLIS SCORE:     09/08/21   45.59%   PRECAUTIONS: Other: lymphedema precautions  OBJECTIVE Treatment Provided Today:    BLE COMPARATIVE LIMB VOLUMETRICS: 09/10/21  LLE (Rx)  LEFT  L LEG (A-D) 2728.3 ml   L THIGH (E-G) 5919.1 ml  L FULL LIMB (A-G) 8647.4 ml  Limb Volume differential (LVD)  A-D LVD= 8.09%, L>R E-G LVD=9.2% R >L (rx) A-G LVD= 3.09%, R>L (WNL)  Volume change since initial %  Volume change overall %  (Blank rows = not tested)  LEFT 10/07/21   L LEG  3469.9 ml  ( INC 16.9%)   L THIGH  5028.6 ml (DEC 7.2%)   L FULL LIMB  8498.5 ml (INC 1.3%)              RLE (dominant) RIGHT  R LEG (A-D) 2968.6 ml  R THIGH (E-G) 5419.2 ml  R FULL LIMB (A-G) 8387 ml  Limb Volume differential (LVD)  %  Volume change since initial %  Volume change overall %  (Blank rows = not tested)   TODAY'S TREATMENT   LLE comparative limb volumetrics in prep for 10th visit progress report  Anatomical measurements for LLE custom toe cap and knee high Applied Kinesio tape anchored at medial knee and extending 2 in wide tape in 4 finger fan   cut  anteriorly across ankle to dorsal foot. NO MLD today due to time constraints  PATIENT/FAMILY EDUCATION:  Pt edu for benefits and application of Kinesiotape for fluid circulation and pain relief. Pt edu re custom compression garment measuring and fitting process. DME vendor's role. Person educated: Patient Education method: explanation, demonstration, tactile and verbal cues, handout Education comprehension: verbalized understanding, returned demonstration, and needs further education   Lymphedema Self-Care HOME  PROGRAM: LLE lymphatic pumping therex ( proximal to distal); 1 set of 10 reps, in order, 2 x daily, including diaphragmatic breathing.  Stretches for flexibility Scar massage Icing Kinesiotaping  ASSESSMENT:  CLINICAL IMPRESSION:  Comparative limb volumetrics reveal that while thigh volume is decreased by 7.2% since last measured on 09/11/22, the leg is significantly increased in limb volume today by 16.9%. Overall full limb volume is increased by 1.3%. These values appear to indicate that compression wraps have not effectively reduced foot swelling. In response we completed anatomical measurements for custom, ccl 2, Elvarex PLUS (seamless) toe cap and Elvarex SOFT ccl 2 ( 23-32 mmHg) knee high stocking. Overlap at open toe end with toe cap will provide increased compression at dorsla and volar foot. Applied kinesio tape as established in effort to gain passive skin stretch throughout the day. Pt will wrap at home after session. Cont as epr POC.  OBJECTIVE IMPAIRMENTS decreased activity tolerance, decreased knowledge of condition, decreased knowledge of use of DME, decreased mobility, difficulty walking, increased edema, increased fascial restrictions, impaired sensation, obesity, and pain.   ACTIVITY LIMITATIONS sitting, standing, squatting, sleeping, transfers, bed mobility, and dressing,  PARTICIPATION LIMITATIONS: impaired functional ambulation and mobiliy, impaired basic and  instrumental ADLs, Impaired work and productive activities,  limited leisure pursuits, impaired social participation. Impaired body image impaired basic and instrumental ADLs  PERSONAL FACTORS obesity exacerbates patient's functional outcome. Length of time since onset; no prior use of compression  REHAB POTENTIAL: Fair     GOALS: Goals reviewed with patient? Yes  LONG TERM GOALS: Target date: 11/19/2021  Given this patient's Intake score of 45.59% on the Lymphedema Life Impact Scale (LLIS), patient will experience an increase of 10 % reduction in her perceived level of functional impairment resulting from  lymphedema to improve functional performance and quality of life (QOL). Baseline: 45.59% Goal status: INITIAL  2.  Given this patient's Intake score of 67/100% on the functional outcomes FOTO tool, patient will experience an increase in function of 5 points to improve basic and instrumental ADLs performance, including lymphedema self-care. Baseline: 67/100% Goal status: INITIAL  3.   Pt will demonstrate understanding of lymphedema precautions and prevention strategies with modified independence using a printed reference to identify at least 5 precautions and discussing how s/he may implement them into daily life to reduce risk of progression and to limit infection risk. Baseline: Max A Goal status: INITIAL  4.  With modified independence (extra time)  Pt will be able to apply multilayer, ankle - knee length, compression wraps using gradient techniques to decrease limb volume, to limit infection risk, and to limit lymphedema progression.  Baseline: Dependent Goal status: INITIAL  5.  Using Complete Decongestive Therapy (CDT) components, including MLD, skin care, and compression,  at home and in the clinic, Pt will reduce L foot volume to a normalized level when compared w/ the unaffected R side to prevent  further lymphedema progression Baseline: Dependent Goal status: INITIAL  6.   Pt will report reduction in L foot pain from 4-5/ 10 to 3-4/10 by discharge to increase functional ambulation and mobility and to achieve increased QOL. Baseline: 4-5/10 Goal status: INITIAL   PLAN: OT FREQUENCY: 2x/week  OT DURATION: 4-6 weeks  PLANNED INTERVENTIONS: Therapeutic exercises, Patient/Family education, Self Care, DME instructions, Manual lymph drainage, Compression bandaging, Taping, Vasopneumatic device, and compression garments- consider toe cap and anklet  PLAN FOR NEXT SESSION:  MLD as established.  Review Kinesiotape effectiveness Review self MLD, there ex and skin care Apply knee length compression wraps, including toes , to LLE Loel Dubonnet, MS, OTR/L, CLT-LANA 10/08/21 10:02 AM

## 2021-10-09 ENCOUNTER — Ambulatory Visit: Payer: BC Managed Care – PPO | Admitting: Occupational Therapy

## 2021-10-09 DIAGNOSIS — I890 Lymphedema, not elsewhere classified: Secondary | ICD-10-CM

## 2021-10-10 NOTE — Therapy (Addendum)
OUTPATIENT OCCUPATIONAL THERAPY TREATMENT NOTE and PROGRESS REPORT LLE LYMPHEDEMA  Patient Name: Wendy Molina MRN: [id number] DOB:[birth year], 36 y.o., female Today's Date: 10/10/2021  Reporting Period: 09/10/21 - 10/09/21    OT End of Session - 10/10/21 1235     Visit Number 10    Number of Visits 36    Date for OT Re-Evaluation 12/07/21    Progress Note Due on Visit 10    OT Start Time 0200    OT Stop Time 0300    OT Time Calculation (min) 60 min    Activity Tolerance Patient tolerated treatment well;No increased pain    Behavior During Therapy [facility] for tasks assessed/performed              Past Medical History:  Diagnosis Date   Broken foot 08/2014   left   Headache(784.0) 2016   migraine w/o aura   Hyperlipidemia    Low vitamin D level    Lymphedema    foot   Migraines    Plantar fasciitis of left foot    Stress fracture of left foot 07/2015   confirmed with MRI   Past Surgical History:  Procedure Laterality Date   INTRAUTERINE DEVICE (IUD) INSERTION     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/01/14   INTRAUTERINE DEVICE INSERTION  12/04/2016   Kyleena    Patient Active Problem List   Diagnosis Date Noted   Rosacea 02/06/2021   Elevated LFTs 02/15/2020   Hyperlipidemia    Encounter for long-term current use of medication 10/23/2015   Obesity 10/23/2015   Vitamin D deficiency 02/12/2015   Chronic migraine w/o aura w/o status migrainosus, not intractable     PCP: Natalia Leatherwood, DO  REFERRING PROVIDER: Ernestene Kiel, DPM  REFERRING DIAG: I89.0  THERAPY DIAG:  Lymphedema  Rationale for Evaluation and Treatment Rehabilitation  ONSET DATE: 09/08/21  SUBJECTIVE                                                                                                                                                                                           SUBJECTIVE STATEMENT: Pt presents to OT clinic for lymphedema care. Pt reports she fell walking into her home            yesterday and fell onto her L Rx leg and foot.LLE is still sore and more swollen than last time. Pt states, "I'm glad I didn't fall on it the day you measured me."  PERTINENT HISTORY:   08/2014  L foot fracture w subsequent onset of chronic foot and leg pain and swelling 2017 L foot stress fracture  chronic PF Hx Migrains  obesity ( BMI ~ 41 today based on height and weight in chart.  3/23 MRI suggestive of peroneal tenosynovitis some capsulitis with edema in the fourth fifth tarsometatarsal joint area and a muscle strain in this area as well did not demonstrate any significant osteoarthritic change in the rear foot though it does demonstrate some in the first metatarsal phalangeal joint. Hx of chronic PF w/ lateral compensatory foot positioning when seated  to reduce weight on sole of foot. Pt's foot and leg in constant motion when seated during evaluation  PAIN:  Are you having pain? yes, not rated  FOTO SCORE:  09/08/21 = 67%  LLIS SCORE:     09/08/21   45.59%   PRECAUTIONS: Other: lymphedema precautions  OBJECTIVE Treatment Provided Today:    BLE COMPARATIVE LIMB VOLUMETRICS: 09/10/21  Initial LLE (Rx) 09/10/21  LEFT 09/10/21  L LEG (A-D) 2728.3 ml   L THIGH (E-G) 5919.1 ml  L FULL LIMB (A-G) 8647.4 ml  Limb Volume differential (LVD)  A-D LVD= 8.09%, L>R E-G LVD=9.2% R >L (rx) A-G LVD= 3.09%, R>L (WNL)  Volume change since initial %  Volume change overall %  (Blank rows = not tested)  LEFT 10/07/21 9th visit LEFT 10/07/21 9th visit  L LEG  3469.9 ml   L LEG INC 16.9%  L THIGH  5028.6 ml  L THIGH INC 7.2%  L FULL LIMB  8498.5 ml  L FULL LIMB INC 1.3%     Volume change since initial        Initial RLE (dominant) 09/10/21 RIGHT 09/10/21  R LEG (A-D) 2968.6 ml  R THIGH (E-G) 5419.2 ml  R FULL LIMB (A-G) 8387 ml  Limb Volume differential (LVD)  %  Volume change since initial %  Volume change overall %  (Blank rows = not tested)   TODAY'S TREATMENT   LLE  comparative limb volumetrics in prep for 10th visit progress report  Anatomical measurements for LLE custom toe cap and knee high Applied Kinesio tape anchored at medial knee and extending 2 in wide tape in 4 finger fan   cut anteriorly across ankle to dorsal foot. NO MLD today due to time constraints  PATIENT/FAMILY EDUCATION:  Pt edu for benefits and application of Kinesiotape for fluid circulation and pain relief. Pt edu re custom compression garment measuring and fitting process. DME vendor's role. Person educated: Patient Education method: explanation, demonstration, tactile and verbal cues, handout Education comprehension: verbalized understanding, returned demonstration, and needs further education   Lymphedema Self-Care HOME  PROGRAM: LLE lymphatic pumping therex ( proximal to distal); 1 set of 10 reps, in order, 2 x daily, including diaphragmatic breathing.  Stretches for flexibility Scar massage Icing Kinesiotaping  ASSESSMENT:  CLINICAL IMPRESSION:  Pt remains diligent and compliant with all LE home program components and has not missed a clinical appointment thus far. Despite excellent compliance, treatment LLE  limb volume is increased in the foot and leg instead since commencing CDT. The uncompressed thigh is decreased by 7.2%, leg is increased by 16.9%, and overall limb volume is increased by 1.3%. Tissue fibrosis at base of toes and pain is unchanged. In response to poor treatment response we completed anatomical measurements for custom, ccl 2, Elvarex PLUS (seamless) toe cap and L Elvarex SOFT ccl 2 ( 23-32 mmHg) knee high stocking. Overlap at open toe end with toe cap will provide increased compression at dorsla and volar foot. Please review LONG TERM GOALS section for additional details re progress to date.  We completed MLD today  w emphasis on leg, ankle and foot with visible volume reduction noted after session. Applied kinesio tape as established in effort to gain  passive skin stretch throughout the day. Pt will wrap at home after session. Cont as epr POC.  OBJECTIVE IMPAIRMENTS decreased activity tolerance, decreased knowledge of condition, decreased knowledge of use of DME, decreased mobility, difficulty walking, increased edema, increased fascial restrictions, impaired sensation, obesity, and pain.   ACTIVITY LIMITATIONS sitting, standing, squatting, sleeping, transfers, bed mobility, and dressing,  PARTICIPATION LIMITATIONS: impaired functional ambulation and mobiliy, impaired basic and instrumental ADLs, Impaired work and productive activities, limited leisure pursuits, impaired social participation. Impaired body image impaired basic and instrumental ADLs  PERSONAL FACTORS obesity exacerbates patient's functional outcome. Length of time since onset; no prior use of compression  REHAB POTENTIAL: Fair     GOALS: Goals reviewed with patient? Yes  LONG TERM GOALS: Target date: 11/21/2021  Given this patient's Intake score of 45.59% on the Lymphedema Life Impact Scale (LLIS), patient will experience an increase of 10 % reduction in her perceived level of functional impairment resulting from  lymphedema to improve functional performance and quality of life (QOL). Baseline: 45.59% Goal status: IN PROGRESS  2.  Given this patient's Intake score of 67/100% on the functional outcomes FOTO tool, patient will experience an increase in function of 5 points to improve basic and instrumental ADLs performance, including lymphedema self-care. Baseline: 67/100% Goal status: IN PROGRESS  3.   Pt will demonstrate understanding of lymphedema precautions and prevention strategies with modified independence using a printed reference to identify at least 5 precautions and discussing how s/he may implement them into daily life to reduce risk of progression and to limit infection risk. Baseline: Max A Goal status: IN PROGRESS  4.  With modified independence (extra  time)  Pt will be able to apply multilayer, ankle - knee length, compression wraps using gradient techniques to decrease limb volume, to limit infection risk, and to limit lymphedema progression.  Baseline: Dependent Goal status: IN PROGRESS  5.  Using Complete Decongestive Therapy (CDT) components, including MLD, skin care, and compression,  at home and in the clinic, Pt will reduce L foot volume to a normalized level when compared w/ the unaffected R side to prevent  further lymphedema progression Baseline: Dependent Goal status: IN PROGRESS  6.  Pt will report reduction in L foot pain from 4-5/ 10 to 3-4/10 by discharge to increase functional ambulation and mobility and to achieve increased QOL. Baseline: 4-5/10 Goal status: IN PROGRESS   PLAN: OT FREQUENCY: 2x/week  OT DURATION: 4-6 weeks  PLANNED INTERVENTIONS: Therapeutic exercises, Patient/Family education, Self Care, DME instructions, Manual lymph drainage, Compression bandaging, Taping, Vasopneumatic device, and compression garments- consider toe cap and anklet  PLAN FOR NEXT SESSION:  MLD as established.  Review self MLD, there ex and skin care Apply knee length compression wraps, including toes , to LLE  Andrey Spearman, MS, OTR/L, CLT-LANA 10/10/21 12:37 PM

## 2021-10-14 LAB — CYTOLOGY - PAP
Chlamydia: NEGATIVE
Comment: NEGATIVE
Comment: NEGATIVE
Comment: NEGATIVE
Comment: NORMAL
Diagnosis: NEGATIVE
High risk HPV: NEGATIVE
Neisseria Gonorrhea: NEGATIVE
Trichomonas: NEGATIVE

## 2021-10-20 ENCOUNTER — Encounter: Payer: BC Managed Care – PPO | Admitting: Occupational Therapy

## 2021-10-29 ENCOUNTER — Ambulatory Visit: Payer: BC Managed Care – PPO | Admitting: Occupational Therapy

## 2021-11-04 ENCOUNTER — Ambulatory Visit: Payer: BC Managed Care – PPO | Attending: Podiatry | Admitting: Occupational Therapy

## 2021-11-04 DIAGNOSIS — I890 Lymphedema, not elsewhere classified: Secondary | ICD-10-CM | POA: Insufficient documentation

## 2021-11-04 NOTE — Therapy (Signed)
OUTPATIENT OCCUPATIONAL THERAPY TREATMENT NOTE and PROGRESS REPORT LLE LYMPHEDEMA  Patient Name: Wendy Molina MRN: [id number] DOB:04-29-1985, 36 y.o., female Today's Date: 11/04/2021  Reporting Period: 09/10/21 - 10/09/21    OT End of Session - 11/04/21 1248     Visit Number 11    Number of Visits 36    Date for OT Re-Evaluation 12/07/21    Progress Note Due on Visit 10    OT Start Time 0900    OT Stop Time 1000    OT Time Calculation (min) 60 min    Activity Tolerance Patient tolerated treatment well;No increased pain    Behavior During Therapy [facility] for tasks assessed/performed              Past Medical History:  Diagnosis Date   Broken foot 08/2014   left   Headache(784.0) 2016   migraine w/o aura   Hyperlipidemia    Low vitamin D level    Lymphedema    foot   Migraines    Plantar fasciitis of left foot    Stress fracture of left foot 07/2015   confirmed with MRI   Past Surgical History:  Procedure Laterality Date   INTRAUTERINE DEVICE (IUD) INSERTION     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/01/14   INTRAUTERINE DEVICE INSERTION  12/04/2016   [place]    Patient Active Problem List   Diagnosis Date Noted   Rosacea 02/06/2021   Elevated LFTs 02/15/2020   Hyperlipidemia    Encounter for long-term current use of medication 10/23/2015   Obesity 10/23/2015   Vitamin D deficiency 02/12/2015   Chronic migraine w/o aura w/o status migrainosus, not intractable     PCP: Ma Hillock, DO  REFERRING PROVIDER: Tyson Dense, DPM  REFERRING DIAG: I89.0  THERAPY DIAG:  Lymphedema  Rationale for Evaluation and Treatment Rehabilitation  ONSET DATE: 09/08/21  SUBJECTIVE                                                                                                                                                                                           SUBJECTIVE STATEMENT: Pt presents to OT clinic for lymphedema care. Pt has no new complaints. Pain in forefoot is  unchanged.   PERTINENT HISTORY:   08/2014  L foot fracture w subsequent onset of chronic foot and leg pain and swelling 2017 L foot stress fracture  chronic PF Hx Migrains  obesity ( BMI ~ 41 today based on height and weight in chart.  3/23 MRI suggestive of peroneal tenosynovitis some capsulitis with edema in the fourth fifth tarsometatarsal joint area and a muscle strain in this area as well  did not demonstrate any significant osteoarthritic change in the rear foot though it does demonstrate some in the first metatarsal phalangeal joint. Hx of chronic PF w/ lateral compensatory foot positioning when seated  to reduce weight on sole of foot. Pt's foot and leg in constant motion when seated during evaluation  PAIN:  Are you having pain? yes, not rated  FOTO SCORE:  09/08/21 = 67%  LLIS SCORE:     09/08/21   45.59%   PRECAUTIONS: Other: lymphedema precautions  OBJECTIVE Treatment Provided Today:    BLE COMPARATIVE LIMB VOLUMETRICS: 09/10/21  Initial LLE (Rx) 09/10/21  LEFT 09/10/21  L LEG (A-D) 2728.3 ml   L THIGH (E-G) 5919.1 ml  L FULL LIMB (A-G) 8647.4 ml  Limb Volume differential (LVD)  A-D LVD= 8.09%, L>R E-G LVD=9.2% R >L (rx) A-G LVD= 3.09%, R>L (WNL)  Volume change since initial %  Volume change overall %  (Blank rows = not tested)  LEFT 10/07/21 9th visit LEFT 10/07/21 9th visit  L LEG  3469.9 ml   L LEG INC 16.9%  L THIGH  5028.6 ml  L THIGH INC 7.2%  L FULL LIMB  8498.5 ml  L FULL LIMB INC 1.3%     Volume change since initial        Initial RLE (dominant) 09/10/21 RIGHT 09/10/21  R LEG (A-D) 2968.6 ml  R THIGH (E-G) 5419.2 ml  R FULL LIMB (A-G) 8387 ml  Limb Volume differential (LVD)  %  Volume change since initial %  Volume change overall %  (Blank rows = not tested)   TODAY'S TREATMENT   Fitted custom compression toe caps only. Compression knee highs have not yet arrived, but have been ordered as per DME vendor.  Provided MLD to LLE/LLQ as  established.   PATIENT/FAMILY EDUCATION:  Pt edu for donning, doffing, wear and care of custom compression garments. Person educated: Patient Education method: explanation, demonstration, tactile and verbal cues, handout Education comprehension: verbalized understanding, returned demonstration, and needs further education   Lymphedema Self-Care HOME  PROGRAM: LLE lymphatic pumping therex ( proximal to distal); 1 set of 10 reps, in order, 2 x daily, including diaphragmatic breathing.  Stretches for flexibility Scar massage Icing Kinesiotaping Custom Elvarex PLUS, ccl 2 seamless toe cap paired with custom flat knit Elvarex soft knee length compression stocking  ASSESSMENT:  CLINICAL IMPRESSION:   L custom , ccl 2 toe cap fits well, except for 4th digit toe stub too long. Pt is able to don and doff independently. Garment is supposed to be paired with a ccl custom knee high with open toe to overlap at base of toes and move fluid proximally out of forefoot. The knee high  is not available for fitting today, but garment vendor reports it was ordered on 10/6 and should arrive very soon.Pt states the toe cap feels a little strange between her toes, but it is not uncomfortable. Pt agrees with plan to work on building tolerance to garment while awaiting stocking. Pt tolerated MLD for remainder of session without pain. Cont OT 1 x weekly until garments are fit as per POC  OBJECTIVE IMPAIRMENTS decreased activity tolerance, decreased knowledge of condition, decreased knowledge of use of DME, decreased mobility, difficulty walking, increased edema, increased fascial restrictions, impaired sensation, obesity, and pain.   ACTIVITY LIMITATIONS sitting, standing, squatting, sleeping, transfers, bed mobility, and dressing,  PARTICIPATION LIMITATIONS: impaired functional ambulation and mobiliy, impaired basic and instrumental ADLs, Impaired work and productive activities, limited leisure  pursuits, impaired  social participation. Impaired body image impaired basic and instrumental ADLs  PERSONAL FACTORS obesity exacerbates patient's functional outcome. Length of time since onset; no prior use of compression  REHAB POTENTIAL: Fair     GOALS: Goals reviewed with patient? Yes  LONG TERM GOALS: Target date: 12/16/2021  Given this patient's Intake score of 45.59% on the Lymphedema Life Impact Scale (LLIS), patient will experience an increase of 10 % reduction in her perceived level of functional impairment resulting from  lymphedema to improve functional performance and quality of life (QOL). Baseline: 45.59% Goal status: IN PROGRESS  2.  Given this patient's Intake score of 67/100% on the functional outcomes FOTO tool, patient will experience an increase in function of 5 points to improve basic and instrumental ADLs performance, including lymphedema self-care. Baseline: 67/100% Goal status: IN PROGRESS  3.   Pt will demonstrate understanding of lymphedema precautions and prevention strategies with modified independence using a printed reference to identify at least 5 precautions and discussing how s/he may implement them into daily life to reduce risk of progression and to limit infection risk. Baseline: Max A Goal status: IN PROGRESS  4.  With modified independence (extra time)  Pt will be able to apply multilayer, ankle - knee length, compression wraps using gradient techniques to decrease limb volume, to limit infection risk, and to limit lymphedema progression.  Baseline: Dependent Goal status: IN PROGRESS  5.  Using Complete Decongestive Therapy (CDT) components, including MLD, skin care, and compression,  at home and in the clinic, Pt will reduce L foot volume to a normalized level when compared w/ the unaffected R side to prevent  further lymphedema progression Baseline: Dependent Goal status: IN PROGRESS  6.  Pt will report reduction in L foot pain from 4-5/ 10 to 3-4/10 by discharge  to increase functional ambulation and mobility and to achieve increased QOL. Baseline: 4-5/10 Goal status: IN PROGRESS   PLAN: OT FREQUENCY: 2x/week  OT DURATION: 4-6 weeks  PLANNED INTERVENTIONS: Therapeutic exercises, Patient/Family education, Self Care, DME instructions, Manual lymph drainage, Compression bandaging, Taping, Vasopneumatic device, and compression garments- consider toe cap and anklet  PLAN FOR NEXT SESSION:  MLD as established.  Review self MLD, there ex and skin care Apply knee length compression wraps, including toes , to LLE  Loel Dubonnet, MS, OTR/L, CLT-LANA 11/04/21 1:01 PM

## 2021-11-11 ENCOUNTER — Ambulatory Visit: Payer: BC Managed Care – PPO | Admitting: Occupational Therapy

## 2021-11-24 ENCOUNTER — Ambulatory Visit: Payer: BC Managed Care – PPO | Admitting: Occupational Therapy

## 2021-11-24 ENCOUNTER — Encounter (INDEPENDENT_AMBULATORY_CARE_PROVIDER_SITE_OTHER): Payer: Self-pay

## 2021-11-25 ENCOUNTER — Ambulatory Visit (INDEPENDENT_AMBULATORY_CARE_PROVIDER_SITE_OTHER): Payer: BC Managed Care – PPO | Admitting: Family Medicine

## 2021-11-25 ENCOUNTER — Encounter: Payer: Self-pay | Admitting: Family Medicine

## 2021-11-25 VITALS — BP 118/84 | HR 95 | Temp 97.5°F | Ht 67.13 in | Wt 269.2 lb

## 2021-11-25 DIAGNOSIS — E782 Mixed hyperlipidemia: Secondary | ICD-10-CM

## 2021-11-25 DIAGNOSIS — Z79899 Other long term (current) drug therapy: Secondary | ICD-10-CM | POA: Diagnosis not present

## 2021-11-25 DIAGNOSIS — E559 Vitamin D deficiency, unspecified: Secondary | ICD-10-CM | POA: Diagnosis not present

## 2021-11-25 DIAGNOSIS — Z0000 Encounter for general adult medical examination without abnormal findings: Secondary | ICD-10-CM | POA: Diagnosis not present

## 2021-11-25 LAB — CBC WITH DIFFERENTIAL/PLATELET
Basophils Absolute: 0.1 10*3/uL (ref 0.0–0.1)
Basophils Relative: 0.9 % (ref 0.0–3.0)
Eosinophils Absolute: 0.1 10*3/uL (ref 0.0–0.7)
Eosinophils Relative: 1.2 % (ref 0.0–5.0)
HCT: 41.8 % (ref 36.0–46.0)
Hemoglobin: 14.4 g/dL (ref 12.0–15.0)
Lymphocytes Relative: 35.9 % (ref 12.0–46.0)
Lymphs Abs: 2.4 10*3/uL (ref 0.7–4.0)
MCHC: 34.5 g/dL (ref 30.0–36.0)
MCV: 92.3 fl (ref 78.0–100.0)
Monocytes Absolute: 0.4 10*3/uL (ref 0.1–1.0)
Monocytes Relative: 6.6 % (ref 3.0–12.0)
Neutro Abs: 3.7 10*3/uL (ref 1.4–7.7)
Neutrophils Relative %: 55.4 % (ref 43.0–77.0)
Platelets: 222 10*3/uL (ref 150.0–400.0)
RBC: 4.53 Mil/uL (ref 3.87–5.11)
RDW: 13.1 % (ref 11.5–15.5)
WBC: 6.7 10*3/uL (ref 4.0–10.5)

## 2021-11-25 LAB — COMPREHENSIVE METABOLIC PANEL
ALT: 23 U/L (ref 0–35)
AST: 17 U/L (ref 0–37)
Albumin: 4.1 g/dL (ref 3.5–5.2)
Alkaline Phosphatase: 61 U/L (ref 39–117)
BUN: 13 mg/dL (ref 6–23)
CO2: 25 mEq/L (ref 19–32)
Calcium: 8.9 mg/dL (ref 8.4–10.5)
Chloride: 105 mEq/L (ref 96–112)
Creatinine, Ser: 0.64 mg/dL (ref 0.40–1.20)
GFR: 113.18 mL/min (ref >60.00)
Glucose, Bld: 91 mg/dL (ref 70–99)
Potassium: 4.2 mEq/L (ref 3.5–5.1)
Sodium: 137 mEq/L (ref 135–145)
Total Bilirubin: 0.6 mg/dL (ref 0.2–1.2)
Total Protein: 6.6 g/dL (ref 6.0–8.3)

## 2021-11-25 LAB — LIPID PANEL
Cholesterol: 157 mg/dL (ref 0–200)
HDL: 45.7 mg/dL (ref >39.00)
LDL Cholesterol: 72 mg/dL (ref 0–99)
NonHDL: 111.27
Total CHOL/HDL Ratio: 3
Triglycerides: 194 mg/dL — ABNORMAL HIGH (ref 0.0–149.0)
VLDL: 38.8 mg/dL (ref 0.0–40.0)

## 2021-11-25 LAB — HEMOGLOBIN A1C: Hgb A1c MFr Bld: 5 % (ref 4.6–6.5)

## 2021-11-25 LAB — VITAMIN D 25 HYDROXY (VIT D DEFICIENCY, FRACTURES): VITD: 24.67 ng/mL — ABNORMAL LOW (ref 30.00–100.00)

## 2021-11-25 LAB — TSH: TSH: 3.19 u[IU]/mL (ref 0.35–5.50)

## 2021-11-25 MED ORDER — AMITRIPTYLINE HCL 50 MG PO TABS: 50.0000 mg | ORAL_TABLET | Freq: Every day | ORAL | 3 refills | Status: DC

## 2021-11-25 MED ORDER — ZOLMITRIPTAN 2.5 MG PO TBDP: 2.5000 mg | ORAL_TABLET | ORAL | 11 refills | Status: DC | PRN

## 2021-11-25 NOTE — Patient Instructions (Signed)
No follow-ups on file.        Great to see you today.  I have refilled the medication(s) we provide.   If labs were collected, we will inform you of lab results once received either by echart message or telephone call.   - echart message- for normal results that have been seen by the patient already.   - telephone call: abnormal results or if patient has not viewed results in their echart.  Health Maintenance, Female Adopting a healthy lifestyle and getting preventive care are important in promoting health and wellness. Ask your health care provider about: The right schedule for you to have regular tests and exams. Things you can do on your own to prevent diseases and keep yourself healthy. What should I know about diet, weight, and exercise? Eat a healthy diet  Eat a diet that includes plenty of vegetables, fruits, low-fat dairy products, and lean protein. Do not eat a lot of foods that are high in solid fats, added sugars, or sodium. Maintain a healthy weight Body mass index (BMI) is used to identify weight problems. It estimates body fat based on height and weight. Your health care provider can help determine your BMI and help you achieve or maintain a healthy weight. Get regular exercise Get regular exercise. This is one of the most important things you can do for your health. Most adults should: Exercise for at least 150 minutes each week. The exercise should increase your heart rate and make you sweat (moderate-intensity exercise). Do strengthening exercises at least twice a week. This is in addition to the moderate-intensity exercise. Spend less time sitting. Even light physical activity can be beneficial. Watch cholesterol and blood lipids Have your blood tested for lipids and cholesterol at 37 years of age, then have this test every 5 years. Have your cholesterol levels checked more often if: Your lipid or cholesterol levels are high. You are older than 37 years of  age. You are at high risk for heart disease. What should I know about cancer screening? Depending on your health history and family history, you may need to have cancer screening at various ages. This may include screening for: Breast cancer. Cervical cancer. Colorectal cancer. Skin cancer. Lung cancer. What should I know about heart disease, diabetes, and high blood pressure? Blood pressure and heart disease High blood pressure causes heart disease and increases the risk of stroke. This is more likely to develop in people who have high blood pressure readings or are overweight. Have your blood pressure checked: Every 3-5 years if you are 18-39 years of age. Every year if you are 40 years old or older. Diabetes Have regular diabetes screenings. This checks your fasting blood sugar level. Have the screening done: Once every three years after age 40 if you are at a normal weight and have a low risk for diabetes. More often and at a younger age if you are overweight or have a high risk for diabetes. What should I know about preventing infection? Hepatitis B If you have a higher risk for hepatitis B, you should be screened for this virus. Talk with your health care provider to find out if you are at risk for hepatitis B infection. Hepatitis C Testing is recommended for: Everyone born from 1945 through 1965. Anyone with known risk factors for hepatitis C. Sexually transmitted infections (STIs) Get screened for STIs, including gonorrhea and chlamydia, if: You are sexually active and are younger than 37 years of age. You are   older than 37 years of age and your health care provider tells you that you are at risk for this type of infection. Your sexual activity has changed since you were last screened, and you are at increased risk for chlamydia or gonorrhea. Ask your health care provider if you are at risk. Ask your health care provider about whether you are at high risk for HIV. Your health  care provider may recommend a prescription medicine to help prevent HIV infection. If you choose to take medicine to prevent HIV, you should first get tested for HIV. You should then be tested every 3 months for as long as you are taking the medicine. Pregnancy If you are about to stop having your period (premenopausal) and you may become pregnant, seek counseling before you get pregnant. Take 400 to 800 micrograms (mcg) of folic acid every day if you become pregnant. Ask for birth control (contraception) if you want to prevent pregnancy. Osteoporosis and menopause Osteoporosis is a disease in which the bones lose minerals and strength with aging. This can result in bone fractures. If you are 65 years old or older, or if you are at risk for osteoporosis and fractures, ask your health care provider if you should: Be screened for bone loss. Take a calcium or vitamin D supplement to lower your risk of fractures. Be given hormone replacement therapy (HRT) to treat symptoms of menopause. Follow these instructions at home: Alcohol use Do not drink alcohol if: Your health care provider tells you not to drink. You are pregnant, may be pregnant, or are planning to become pregnant. If you drink alcohol: Limit how much you have to: 0-1 drink a day. Know how much alcohol is in your drink. In the [place] one drink equals one 12 oz bottle of beer (355 mL), one 5 oz glass of wine (148 mL), or one 1 oz glass of hard liquor (44 mL). Lifestyle Do not use any products that contain nicotine or tobacco. These products include cigarettes, chewing tobacco, and vaping devices, such as e-cigarettes. If you need help quitting, ask your health care provider. Do not use street drugs. Do not share needles. Ask your health care provider for help if you need support or information about quitting drugs. General instructions Schedule regular health, dental, and eye exams. Stay current with your vaccines. Tell your health  care provider if: You often feel depressed. You have ever been abused or do not feel safe at home. Summary Adopting a healthy lifestyle and getting preventive care are important in promoting health and wellness. Follow your health care provider's instructions about healthy diet, exercising, and getting tested or screened for diseases. Follow your health care provider's instructions on monitoring your cholesterol and blood pressure. This information is not intended to replace advice given to you by your health care provider. Make sure you discuss any questions you have with your health care provider. Document Revised: 06/03/2020 Document Reviewed: 06/03/2020 [employer] Patient Education  2023 [employer]  

## 2021-11-25 NOTE — Progress Notes (Signed)
Patient ID: Wendy Molina, female  DOB: Aug 16, 1985, 36 y.o.   MRN: [id number] Patient Care Team    Relationship Specialty Notifications Start End  Natalia Leatherwood, DO PCP - General Family Medicine  02/11/15   Patton Salles, MD Consulting Physician Obstetrics and Gynecology  10/26/16     Chief Complaint  Patient presents with   Annual Exam    Pt is fasting    Subjective:  Wendy Molina is a 36 y.o.  Female  present for CPE/CMC All past medical history, surgical history, allergies, family history, immunizations, medications and social history were updated in the electronic medical record today. All recent labs, ED visits and hospitalizations within the last year were reviewed.  Migraine Patient reports compliance with her amitriptyline 50 mg before bed.  She has not routinely needed the Zomig when taking the amitriptyline.   Vit d def: Pt reports she is taking vit d daily supplement daily   Health maintenance: Colonoscopy: No Fhx, screen at 44 Mammogram: No Fhx screen at 39 Cervical cancer screening: last pap: 09/2020,  Dr. Edward Jolly, Normal - records requested Immunizations: tdap due soon,  Influenza declined(encouraged yearly), covid series completed Infectious disease screening: HIV and Hep c completed      11/25/2021    9:42 AM 11/22/2020    9:13 AM 11/22/2019    9:26 AM 10/31/2018    8:23 AM 10/28/2017    8:16 AM  Depression screen PHQ 2/9  Decreased Interest 1 0 0 0 0  Down, Depressed, Hopeless 0 0 0 0 0  PHQ - 2 Score 1 0 0 0 0  Altered sleeping 3      Tired, decreased energy 2      Change in appetite 2      Feeling bad or failure about yourself  0      Trouble concentrating 2      Moving slowly or fidgety/restless 0      Suicidal thoughts 0      PHQ-9 Score 10          11/25/2021    9:43 AM  GAD 7 : Generalized Anxiety Score  Nervous, Anxious, on Edge 2  Control/stop worrying 1  Worry too much - different things 1  Trouble relaxing 1   Restless 0  Easily annoyed or irritable 1  Afraid - awful might happen 0  Total GAD 7 Score 6     Immunization History  Administered Date(s) Administered   HPV 9-valent 10/01/2017, 11/26/2017, 09/21/2018   PFIZER(Purple Top)SARS-COV-2 Vaccination 04/27/2019, 05/23/2019, 06/27/2020   Tdap 06/22/2012    Past Medical History:  Diagnosis Date   Broken foot 08/2014   left   Headache(784.0) 2016   migraine w/o aura   Hyperlipidemia    Low vitamin D level    Lymphedema    foot   Migraines    Plantar fasciitis of left foot    Stress fracture of left foot 07/2015   confirmed with MRI   No Known Allergies Past Surgical History:  Procedure Laterality Date   INTRAUTERINE DEVICE (IUD) INSERTION     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/01/14   INTRAUTERINE DEVICE INSERTION  12/04/2016   Kyleena    Family History  Problem Relation Age of Onset   Arthritis Mother    Colitis Mother    Hemachromatosis Brother    Heart disease Maternal Aunt        MI 55-56   Lung cancer Maternal Grandmother  smoker   Cancer Maternal Grandmother        kidney   Alzheimer's disease Maternal Grandfather    Leukemia Paternal Grandmother        unsure correct type of cancer   Heart failure Paternal Grandmother    Kidney failure Paternal Grandfather        ? dialysis   Social History   Social History Narrative   Single- in a relationship. No children. 3 cats.    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   Hobbies: travel, reading, hiking   Feels safe in relationships    Allergies as of 11/25/2021   No Known Allergies      Medication List        Accurate as of November 25, 2021  9:46 AM. If you have any questions, ask your nurse or doctor.          STOP taking these medications    meloxicam 15 MG tablet Commonly known as: MOBIC Stopped by: Felix Pacini, DO       TAKE these medications    amitriptyline 50  MG tablet Commonly known as: ELAVIL Take 1 tablet (50 mg total) by mouth at bedtime.   levonorgestrel 19.5 MG IUD Commonly known as: KYLEENA by Intrauterine route. Inserted 12-04-16   metroNIDAZOLE 1 % gel Commonly known as: METROGEL Apply topically daily. Apply thin layer of gel to affected area nightly until symptoms are resolved.   multivitamin with minerals Tabs tablet Take 1 tablet by mouth daily.   zolmitriptan 2.5 MG disintegrating tablet Commonly known as: ZOMIG-ZMT Take 1 tablet (2.5 mg total) by mouth as needed for migraine (may take repeat dose 2 hours later x1).        All past medical history, surgical history, allergies, family history, immunizations andmedications were updated in the EMR today and reviewed under the history and medication portions of their EMR.      No results found.   ROS: 14 pt review of systems performed and negative (unless mentioned in an HPI)  Objective: BP 118/84   Pulse 95   Temp (!) 97.5 F (36.4 C)   Ht 5' 7.13" (1.705 m)   Wt 269 lb 3.2 oz (122.1 kg)   SpO2 98%   BMI 42.00 kg/m  Physical Exam Vitals and nursing note reviewed.  Constitutional:      General: She is not in acute distress.    Appearance: Normal appearance. She is not ill-appearing or toxic-appearing.  HENT:     Head: Normocephalic and atraumatic.     Right Ear: Tympanic membrane, ear canal and external ear normal. There is no impacted cerumen.     Left Ear: Tympanic membrane, ear canal and external ear normal. There is no impacted cerumen.     Nose: No congestion or rhinorrhea.     Mouth/Throat:     Mouth: Mucous membranes are moist.     Pharynx: Oropharynx is clear. No oropharyngeal exudate or posterior oropharyngeal erythema.  Eyes:     General: No scleral icterus.       Right eye: No discharge.        Left eye: No discharge.     Extraocular Movements: Extraocular movements intact.     Conjunctiva/sclera: Conjunctivae normal.     Pupils: Pupils are  equal, round, and reactive to light.  Cardiovascular:     Rate and Rhythm: Normal rate and  regular rhythm.     Pulses: Normal pulses.     Heart sounds: Normal heart sounds. No murmur heard.    No friction rub. No gallop.  Pulmonary:     Effort: Pulmonary effort is normal. No respiratory distress.     Breath sounds: Normal breath sounds. No stridor. No wheezing, rhonchi or rales.  Chest:     Chest wall: No tenderness.  Abdominal:     General: Abdomen is flat. Bowel sounds are normal. There is no distension.     Palpations: Abdomen is soft. There is no mass.     Tenderness: There is no abdominal tenderness. There is no right CVA tenderness, left CVA tenderness, guarding or rebound.     Hernia: No hernia is present.  Musculoskeletal:        General: No swelling, tenderness or deformity. Normal range of motion.     Cervical back: Normal range of motion and neck supple. No rigidity or tenderness.     Right lower leg: No edema.     Left lower leg: No edema.  Lymphadenopathy:     Cervical: No cervical adenopathy.  Skin:    General: Skin is warm and dry.     Coloration: Skin is not jaundiced or pale.     Findings: No bruising, erythema, lesion or rash.  Neurological:     General: No focal deficit present.     Mental Status: She is alert and oriented to person, place, and time. Mental status is at baseline.     Cranial Nerves: No cranial nerve deficit.     Sensory: No sensory deficit.     Motor: No weakness.     Coordination: Coordination normal.     Gait: Gait normal.     Deep Tendon Reflexes: Reflexes normal.  Psychiatric:        Mood and Affect: Mood normal.        Behavior: Behavior normal.        Thought Content: Thought content normal.        Judgment: Judgment normal.     No results found.  Assessment/plan: JASMEEN FRITSCH is a 36 y.o. female present for CPE/CMC Mixed hyperlipidemia/obesity CBC, CMP, TSH and lipids collected today  Vitamin D deficiency vit d  collected today Encouraged her to make sure she is supplementing with 1000u vit d daily.  Chronic migraine w/o aura w/o status migrainosus, not intractable Stable Continue  amitrip 50 qhs and zomig prn   Encounter for long-term current use of medication - Hemoglobin A1c  Routine general medical examination at a health care facility Colonoscopy: No Fhx, screen at 44 Mammogram: No Fhx screen at 39 Cervical cancer screening: last pap: 09/2020,  Dr. Edward Jolly, Normal - records requested Immunizations: tdap due soon she can have my nurse visit if desired,  Influenza declined(encouraged yearly), covid series completed Infectious disease screening: HIV and Hep c completed Patient was encouraged to exercise greater than 150 minutes a week. Patient was encouraged to choose a diet filled with fresh fruits and vegetables, and lean meats. AVS provided to patient today for education/recommendation on gender specific health and safety maintenance.  Return in about 1 year (around 11/27/2022) for cpe (20 min), Routine chronic condition follow-up.   Orders Placed This Encounter  Procedures   CBC with Differential/Platelet   Comprehensive metabolic panel   Lipid panel   Hemoglobin A1c   TSH   VITAMIN D 25 Hydroxy (Vit-D Deficiency, Fractures)    Meds ordered this encounter  Medications   amitriptyline (ELAVIL) 50 MG tablet    Sig: Take 1 tablet (50 mg total) by mouth at bedtime.    Dispense:  90 tablet    Refill:  3   zolmitriptan (ZOMIG-ZMT) 2.5 MG disintegrating tablet    Sig: Take 1 tablet (2.5 mg total) by mouth as needed for migraine (may take repeat dose 2 hours later x1).    Dispense:  10 tablet    Refill:  11    Referral Orders  No referral(s) requested today     Electronically signed by: Howard Pouch, [place]

## 2021-11-27 ENCOUNTER — Ambulatory Visit: Payer: BC Managed Care – PPO

## 2021-12-02 DIAGNOSIS — I890 Lymphedema, not elsewhere classified: Secondary | ICD-10-CM | POA: Diagnosis not present

## 2021-12-11 ENCOUNTER — Ambulatory Visit: Payer: BC Managed Care – PPO | Attending: Podiatry | Admitting: Occupational Therapy

## 2021-12-23 NOTE — Progress Notes (Signed)
GYNECOLOGY  VISIT   HPI: 36 y.o.   Domestic Partner  Caucasian  female   [id number] with No LMP recorded. (Menstrual status: IUD).   here for   [employer].  UPT negative.   GYNECOLOGIC HISTORY: No LMP recorded. (Menstrual status: IUD). Contraception:  Kyleena IUD, inserted 01/07/17 Menopausal hormone therapy:  n/a Last mammogram:  n/a Last pap smear:   10/02/21 negative: HR HPV Negative, 09/17/17 negative: HR HPV Negative        OB History     Gravida  0   Para  0   Term  0   Preterm  0   AB  0   Living  0      SAB  0   IAB  0   Ectopic  0   Multiple  0   Live Births  0              Patient Active Problem List   Diagnosis Date Noted   Rosacea 02/06/2021   Elevated LFTs 02/15/2020   Hyperlipidemia    Encounter for long-term current use of medication 10/23/2015   Obesity 10/23/2015   Vitamin D deficiency 02/12/2015   Chronic migraine w/o aura w/o status migrainosus, not intractable     Past Medical History:  Diagnosis Date   Broken foot 08/2014   left   Headache(784.0) 2016   migraine w/o aura   Hyperlipidemia    Low vitamin D level    Lymphedema    foot   Migraines    Plantar fasciitis of left foot    Stress fracture of left foot 07/2015   confirmed with MRI    Past Surgical History:  Procedure Laterality Date   INTRAUTERINE DEVICE (IUD) INSERTION     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/01/14   INTRAUTERINE DEVICE INSERTION  12/04/2016   Kyleena     Current Outpatient Medications  Medication Sig Dispense Refill   amitriptyline (ELAVIL) 50 MG tablet Take 1 tablet (50 mg total) by mouth at bedtime. 90 tablet 3   levonorgestrel (KYLEENA) 19.5 MG IUD by Intrauterine route. Inserted 12-04-16     metroNIDAZOLE (METROGEL) 1 % gel Apply topically daily. Apply thin layer of gel to affected area nightly until symptoms are resolved. 60 g 5   Multiple Vitamin (MULTIVITAMIN WITH MINERALS) TABS tablet Take 1 tablet by mouth daily.     zolmitriptan (ZOMIG-ZMT) 2.5  MG disintegrating tablet Take 1 tablet (2.5 mg total) by mouth as needed for migraine (may take repeat dose 2 hours later x1). 10 tablet 11   No current facility-administered medications for this visit.     ALLERGIES: Patient has no known allergies.  Family History  Problem Relation Age of Onset   Arthritis Mother    Colitis Mother    Hemachromatosis Brother    Heart disease Maternal Aunt        MI 37-56   Lung cancer Maternal Grandmother        smoker   Cancer Maternal Grandmother        kidney   Alzheimer's disease Maternal Grandfather    Leukemia Paternal Grandmother        unsure correct type of cancer   Heart failure Paternal Grandmother    Kidney failure Paternal Grandfather        ? dialysis    Social History   Socioeconomic History   Marital status: Media planner    Spouse name: Not on file   Number of children: 0   Years  of education: Not on file   Highest education level: Bachelor's degree (e.g., BA, AB, BS)  Occupational History   Occupation: mortgage insurance  Tobacco Use   Smoking status: Never   Smokeless tobacco: Never  Vaping Use   Vaping Use: Never used  Substance and Sexual Activity   Alcohol use: Yes    Alcohol/week: 5.0 - 6.0 standard drinks of alcohol    Types: 5 - 6 Standard drinks or equivalent per week   Drug use: No   Sexual activity: Yes    Partners: Male    Birth control/protection: I.U.D.    Comment: kyleena 12-04-16  Other Topics Concern   Not on file  Social History Narrative   Single- in a relationship. No children. 3 cats.    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   Hobbies: travel, reading, hiking   Feels safe in relationships   Social Determinants of Health   Financial Resource Strain: Low Risk  (02/03/2021)   Overall Financial Resource Strain (CARDIA)    Difficulty of Paying Living Expenses: Not very hard  Food Insecurity: No Food  Insecurity (02/03/2021)   Hunger Vital Sign    Worried About Running Out of Food in the Last Year: Never true    Ran Out of Food in the Last Year: Never true  Transportation Needs: No Transportation Needs (02/03/2021)   PRAPARE - Administrator, Civil Service (Medical): No    Lack of Transportation (Non-Medical): No  Physical Activity: Insufficiently Active (02/03/2021)   Exercise Vital Sign    Days of Exercise per Week: 2 days    Minutes of Exercise per Session: 20 min  Stress: No Stress Concern Present (02/03/2021)   [employer] of Occupational Health - Occupational Stress Questionnaire    Feeling of Stress : Only a little  Social Connections: Socially Isolated (02/03/2021)   Social Connection and Isolation Panel [NHANES]    Frequency of Communication with Friends and Family: Once a week    Frequency of Social Gatherings with Friends and Family: Once a week    Attends Religious Services: Never    Database administrator or Organizations: No    Attends Engineer, structural: Not on file    Marital Status: Living with partner  Intimate Partner Violence: Not on file    Review of Systems  All other systems reviewed and are negative.   PHYSICAL EXAMINATION:    BP 128/80 (BP Location: Right Arm, Patient Position: Sitting, Cuff Size: Large)   Ht 5\' 6"  (1.676 m)   Wt 266 lb (120.7 kg)   BMI 42.93 kg/m     General appearance: alert, cooperative and appears stated age   Pelvic: External genitalia:  no lesions              Urethra:  normal appearing urethra with no masses, tenderness or lesions              Bartholins and Skenes: normal                 Vagina: normal appearing vagina with normal color and discharge, no lesions              Cervix: no lesions.  IUD strings noted.                Bimanual Exam:  Uterus:  normal size, contour, position, consistency, mobility, non-tender  Adnexa: no mass, fullness, tenderness              Chaperone was  present for exam:  EMily.   Exchange of Lueders IUD.  New Kyleena IUD lot number [id number], exp Dec 2025. Consent done.  Hibiclens prep.  Paracervical block with 10 cc local 1% lidocaine, lot [id number], Exp 02/27/23 Tenaculum to anterior cervical lip. Ring forceps used to remove IUD, confirmed to be intact, and discarded. New Kyleena IUD inserted to almost 8 cm. IUD strings trimmed.  No complications.  Minimal EBL.   Repeat bimanual exam, no change.  ASSESSMENT  Kyleena IUD exchange.    PLAN  Back up protection for one week.  Post IUD precautions to patient.  New IUD card to patient.  Follow up for an IUD check up in 4 weeks.    An After Visit Summary was printed and given to the patient.

## 2021-12-25 ENCOUNTER — Other Ambulatory Visit: Payer: Self-pay | Admitting: *Deleted

## 2021-12-25 ENCOUNTER — Ambulatory Visit: Payer: BC Managed Care – PPO | Admitting: Obstetrics and Gynecology

## 2021-12-25 ENCOUNTER — Encounter: Payer: Self-pay | Admitting: Obstetrics and Gynecology

## 2021-12-25 VITALS — BP 128/80 | Ht 66.0 in | Wt 266.0 lb

## 2021-12-25 DIAGNOSIS — Z30432 Encounter for removal of intrauterine contraceptive device: Secondary | ICD-10-CM

## 2021-12-25 DIAGNOSIS — Z30430 Encounter for insertion of intrauterine contraceptive device: Secondary | ICD-10-CM

## 2021-12-25 DIAGNOSIS — Z01812 Encounter for preprocedural laboratory examination: Secondary | ICD-10-CM

## 2021-12-25 DIAGNOSIS — Z30433 Encounter for removal and reinsertion of intrauterine contraceptive device: Secondary | ICD-10-CM | POA: Diagnosis not present

## 2021-12-25 HISTORY — PX: INTRAUTERINE DEVICE INSERTION: SHX323

## 2021-12-25 LAB — PREGNANCY, URINE: Preg Test, Ur: NEGATIVE

## 2021-12-27 NOTE — Patient Instructions (Signed)
Intrauterine Device Insertion An intrauterine device (IUD) is a medical device that is inserted into the uterus to prevent pregnancy. It is a small, T-shaped device that has one or two nylon strings hanging down from it. The strings hang out of the lower part of the uterus (cervix) to allow for future IUD removal. There are two types of IUDs: Hormone IUD. This type of IUD is made of plastic and contains the hormone progestin (synthetic progesterone). A hormone IUD may last 3-5 years, depending on which one you have. Synthetic progesterone prevents pregnancy by: Thickening cervical mucus to prevent sperm from entering the uterus. Thinning the uterine lining to prevent a fertilized egg from implanting there. Copper IUD. This type of IUD has copper wire wrapped around it. A copper IUD may last up to 10 years. Copper prevents pregnancy by making the uterus and fallopian tubes produce a fluid that kills sperm. Tell a health care provider about: Any allergies you have. All medicines you are taking, including vitamins, herbs, eye drops, creams, and over-the-counter medicines. Any surgeries you have had. Any medical conditions you have, including any sexually transmitted infections (STIs) you may have. Whether you are pregnant or may be pregnant. What are the risks? Generally, this is a safe procedure. However, problems may occur, including: Infection. Bleeding. Allergic reactions to medicines. Puncture (perforation) of the uterus or damage to other structures or organs. Accidental placement of the IUD either in the muscle layer of the uterus (myometrium) or outside the uterus. The IUD falling out of the uterus (expulsion). This is more common among women who have recently had a child. Higher risk of an egg being fertilized outside your uterus (ectopic pregnancy).This is rare. Pelvic inflammatory disease (PID), which is an infection in the uterus and fallopian tubes. The IUD does not cause the  infection. The infection is usually from an unknown sexually transmitted infection (STI). This is rare, and it usually happens during the first 20 days after the IUD is inserted. What happens before the procedure? Ask your health care provider about: Changing or stopping your regular medicines. This is especially important if you are taking diabetes medicines or blood thinners. Taking over-the-counter medicines, vitamins, herbs, and supplements. Talk with your health care provider about when to schedule your IUD placement. Your health care provider may recommend taking over-the-counter pain medicines before the procedure. These medicines include ibuprofen and naproxen. You may have tests for: Pregnancy. A pregnancy test involves having a urine or blood sample taken. Sexually transmitted infections (STIs). Placing an IUD in someone who has an STI can make the infection worse. Cervical cancer. You may have a Pap test to check for this type of cancer. This means collecting cells from your cervix to be checked under a microscope. You may have a physical exam to determine the size and position of your uterus. What happens during the procedure? A tool (speculum) will be placed in your vagina and widened so that your health care provider can see your cervix. Medicine, or antiseptic, may be applied to your cervix to help lower your risk of infection. You may be given an anesthetic medicine to numb each side of your cervix. This medicine is usually given by an injection into the cervix. A tool called a uterine sound will be inserted into your uterus to check the length of your uterus and the direction that your uterus may be tilted. A slim instrument or tube (IUD inserter) that holds the IUD will be inserted into your vagina,   through your cervical canal, and into your uterus. The IUD will be placed in the uterus, and the IUD inserter will be removed. The strings that are attached to the IUD will be trimmed  so that they lie just below the cervix. The speculum will be removed. The procedure may vary among health care providers and hospitals. What can I expect after procedure? You may have bleeding after the procedure. This is normal. It varies from light bleeding (spotting) for a few days to menstrual-like bleeding. You may have cramping and pain in the abdomen. You may feel dizzy or light-headed. You may have lower back pain. You may have headaches and nausea. Follow these instructions at home: Before resuming sexual activity, check to make sure that you can feel the IUD string or strings. You should be able to feel the end of the string below the opening of your cervix. If your IUD string is in place, you may resume sexual activity. If you had a hormonal IUD inserted more than 7 days after your most recent period started, you will need to use a backup method of birth control for 7 days after IUD insertion. Ask your health care provider whether this applies to you. Continue to check that the IUD is still in place by feeling for the strings after every menstrual period, or once a month. An IUD will not protect you from sexually transmitted infections (STIs). Use methods to prevent the exchange of body fluids between partners (barrier protection) every time you have sex. Barrier protection can be used during oral, vaginal, or anal sex. Commonly used barrier methods include: Female condom. Female condom. Dental dam. Take over-the-counter and prescription medicines only as told by your health care provider. Keep all follow-up visits. This is important. Contact a health care provider if: You feel light-headed or weak. You have any of the following problems with your IUD string or strings: The string bothers or hurts you or your sexual partner. You cannot feel the string. The string has gotten longer. You can feel the IUD in your vagina. You think you may be pregnant, or you miss your menstrual  period. You think you may have a sexually transmitted infection (STI). Get help right away if you: You have flu-like symptoms, such as tiredness (fatigue) and muscle aches. You have a fever and chills. You have bleeding that is heavier or lasts longer than a normal menstrual cycle. You have abnormal or bad-smelling discharge from your vagina. You develop abdominal pain that is new, is getting worse, or is not in the same area of earlier cramping and pain. You have pain during sexual activity. Summary An intrauterine device (IUD) is a small, T-shaped device that has one or two nylon strings hanging down from it. You may have a copper IUD or a hormone IUD. Ask your health care provider what you need to do before the procedure. You may have some tests and you may have to change or stop some medicines. You may have bleeding after the procedure. This is normal. It varies from light spotting for a few days to menstrual-like bleeding. Check to make sure that you can feel the IUD strings before you resume sexual activity. Check the strings after every menstrual period or once a month. An IUD does not protect against STIs. Use other methods to protect yourself against infections. This information is not intended to replace advice given to you by your health care provider. Make sure you discuss any questions you have with   your health care provider. Document Revised: 07/26/2019 Document Reviewed: 07/26/2019 Elsevier Patient Education  2023 [employer] Inc.  

## 2022-01-02 ENCOUNTER — Ambulatory Visit: Payer: BC Managed Care – PPO | Admitting: Occupational Therapy

## 2022-01-13 NOTE — Progress Notes (Unsigned)
GYNECOLOGY  VISIT   HPI: 36 y.o.   Domestic Partner  Caucasian  female   [id number] with No LMP recorded. (Menstrual status: IUD).   here for   4 week IUD f/u  Cramps this week week and last week.   Bled for a couple of days after placement.  No pain with sex.   Neg STD screening for GC/CT/trich on 10/02/21.   GYNECOLOGIC HISTORY: No LMP recorded. (Menstrual status: IUD). Contraception:  Rutha Bouchard- 12/25/21 Menopausal hormone therapy:  n/a Last mammogram:  n/a Last pap smear:   10/02/21 negative: HR HPV negative, 09/17/17 negative        OB History     Gravida  0   Para  0   Term  0   Preterm  0   AB  0   Living  0      SAB  0   IAB  0   Ectopic  0   Multiple  0   Live Births  0              Patient Active Problem List   Diagnosis Date Noted   Rosacea 02/06/2021   Elevated LFTs 02/15/2020   Hyperlipidemia    Encounter for long-term current use of medication 10/23/2015   Obesity 10/23/2015   Vitamin D deficiency 02/12/2015   Chronic migraine w/o aura w/o status migrainosus, not intractable     Past Medical History:  Diagnosis Date   Broken foot 08/2014   left   Headache(784.0) 2016   migraine w/o aura   Hyperlipidemia    Low vitamin D level    Lymphedema    foot   Migraines    Plantar fasciitis of left foot    Stress fracture of left foot 07/2015   confirmed with MRI    Past Surgical History:  Procedure Laterality Date   INTRAUTERINE DEVICE (IUD) INSERTION     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/01/14   INTRAUTERINE DEVICE INSERTION  12/04/2016   Kyleena     Current Outpatient Medications  Medication Sig Dispense Refill   amitriptyline (ELAVIL) 50 MG tablet Take 1 tablet (50 mg total) by mouth at bedtime. 90 tablet 3   levonorgestrel (KYLEENA) 19.5 MG IUD by Intrauterine route. Inserted 12-04-16     Multiple Vitamin (MULTIVITAMIN WITH MINERALS) TABS tablet Take 1 tablet by mouth daily.     zolmitriptan (ZOMIG-ZMT) 2.5 MG disintegrating tablet Take 1  tablet (2.5 mg total) by mouth as needed for migraine (may take repeat dose 2 hours later x1). 10 tablet 11   metroNIDAZOLE (METROGEL) 1 % gel Apply topically daily. Apply thin layer of gel to affected area nightly until symptoms are resolved. (Patient not taking: Reported on 01/27/2022) 60 g 5   No current facility-administered medications for this visit.     ALLERGIES: Patient has no known allergies.  Family History  Problem Relation Age of Onset   Arthritis Mother    Colitis Mother    Hemachromatosis Brother    Heart disease Maternal Aunt        MI 27-56   Lung cancer Maternal Grandmother        smoker   Cancer Maternal Grandmother        kidney   Alzheimer's disease Maternal Grandfather    Leukemia Paternal Grandmother        unsure correct type of cancer   Heart failure Paternal Grandmother    Kidney failure Paternal Grandfather        ? dialysis  Social History   Socioeconomic History   Marital status: Media planner    Spouse name: Not on file   Number of children: 0   Years of education: Not on file   Highest education level: Bachelor's degree (e.g., BA, AB, BS)  Occupational History   Occupation: mortgage insurance  Tobacco Use   Smoking status: Never   Smokeless tobacco: Never  Vaping Use   Vaping Use: Never used  Substance and Sexual Activity   Alcohol use: Yes    Alcohol/week: 5.0 - 6.0 standard drinks of alcohol    Types: 5 - 6 Standard drinks or equivalent per week   Drug use: No   Sexual activity: Yes    Partners: Male    Birth control/protection: I.U.D.    Comment: kyleena 12-04-16  Other Topics Concern   Not on file  Social History Narrative   Single- in a relationship. No children. 3 cats.    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   Hobbies: travel, reading, hiking   Feels safe in relationships   Social Determinants of Health   Financial Resource Strain:  Low Risk  (02/03/2021)   Overall Financial Resource Strain (CARDIA)    Difficulty of Paying Living Expenses: Not very hard  Food Insecurity: No Food Insecurity (02/03/2021)   Hunger Vital Sign    Worried About Running Out of Food in the Last Year: Never true    Ran Out of Food in the Last Year: Never true  Transportation Needs: No Transportation Needs (02/03/2021)   PRAPARE - Administrator, Civil Service (Medical): No    Lack of Transportation (Non-Medical): No  Physical Activity: Insufficiently Active (02/03/2021)   Exercise Vital Sign    Days of Exercise per Week: 2 days    Minutes of Exercise per Session: 20 min  Stress: No Stress Concern Present (02/03/2021)   [employer] of Occupational Health - Occupational Stress Questionnaire    Feeling of Stress : Only a little  Social Connections: Socially Isolated (02/03/2021)   Social Connection and Isolation Panel [NHANES]    Frequency of Communication with Friends and Family: Once a week    Frequency of Social Gatherings with Friends and Family: Once a week    Attends Religious Services: Never    Database administrator or Organizations: No    Attends Engineer, structural: Not on file    Marital Status: Living with partner  Intimate Partner Violence: Not on file    Review of Systems  All other systems reviewed and are negative.   PHYSICAL EXAMINATION:    BP 136/84 (BP Location: Left Arm, Patient Position: Sitting, Cuff Size: Large)   Wt 270 lb (122.5 kg)   BMI 43.58 kg/m     General appearance: alert, cooperative and appears stated age  Pelvic: External genitalia:  no lesions              Urethra:  normal appearing urethra with no masses, tenderness or lesions              Bartholins and Skenes: normal                 Vagina: normal appearing vagina with normal color and discharge, no lesions              Cervix: no lesions.  Friable cervix.  Bimanual Exam:  Uterus:  normal size, contour,  position, consistency, mobility, non-tender              Adnexa: no mass, fullness, tenderness          Chaperone was present for exam:  Terra  ASSESSMENT  IUD check up   PLAN  Fu for annual exam and prn.    An After Visit Summary was printed and given to the patient.  ______ minutes face to face time of which over 50% was spent in counseling.

## 2022-01-27 ENCOUNTER — Ambulatory Visit: Payer: BC Managed Care – PPO | Admitting: Obstetrics and Gynecology

## 2022-01-27 VITALS — BP 136/84 | Wt 270.0 lb

## 2022-01-27 DIAGNOSIS — Z30431 Encounter for routine checking of intrauterine contraceptive device: Secondary | ICD-10-CM | POA: Diagnosis not present

## 2022-01-28 ENCOUNTER — Encounter: Payer: Self-pay | Admitting: Obstetrics and Gynecology

## 2022-06-16 ENCOUNTER — Ambulatory Visit: Payer: BC Managed Care – PPO | Admitting: Family Medicine

## 2022-06-16 ENCOUNTER — Encounter: Payer: Self-pay | Admitting: Family Medicine

## 2022-06-16 VITALS — BP 113/81 | HR 96 | Temp 98.2°F | Wt 274.2 lb

## 2022-06-16 DIAGNOSIS — H6992 Unspecified Eustachian tube disorder, left ear: Secondary | ICD-10-CM

## 2022-06-16 DIAGNOSIS — J0100 Acute maxillary sinusitis, unspecified: Secondary | ICD-10-CM | POA: Diagnosis not present

## 2022-06-16 DIAGNOSIS — L918 Other hypertrophic disorders of the skin: Secondary | ICD-10-CM

## 2022-06-16 MED ORDER — TRIAMCINOLONE ACETONIDE 55 MCG/ACT NA AERO: 2.0000 | INHALATION_SPRAY | Freq: Every day | NASAL | 12 refills | Status: AC

## 2022-06-16 MED ORDER — DOXYCYCLINE HYCLATE 100 MG PO TABS: 100.0000 mg | ORAL_TABLET | Freq: Two times a day (BID) | ORAL | 0 refills | Status: DC

## 2022-06-16 NOTE — Patient Instructions (Signed)
Return if symptoms worsen or fail to improve.        Great to see you today.  I have refilled the medication(s) we provide.   If labs were collected, we will inform you of lab results once received either by echart message or telephone call.   - echart message- for normal results that have been seen by the patient already.   - telephone call: abnormal results or if patient has not viewed results in their echart.  

## 2022-06-16 NOTE — Progress Notes (Signed)
Wendy Molina , [birth year], 37 y.o., female MRN: [id number] Patient Care Team    Relationship Specialty Notifications Start End  Natalia Leatherwood, DO PCP - General Family Medicine  02/11/15   Patton Salles, MD Consulting Physician Obstetrics and Gynecology  10/26/16     Chief Complaint  Patient presents with   Acute Visit    States that her left ear has a lot of pressure and has been clogged for over a year, but has recently gotten worse. She has also been dealing with nasal congestion. She also has a skin tag on her left eye lid that has been bothering her.     Subjective: Wendy Molina is a 37 y.o. Pt presents for an OV with complaints of left ear discomfort and nasal congestion of 3-4 mos duration.  Associated symptoms include (please see ros). Pt has tried flonase to ease their symptoms.  She has taken claritin. She also has a skin tag on her left upper lid that is affecting her visual field.      06/16/2022    8:55 AM 11/25/2021    9:42 AM 11/22/2020    9:13 AM 11/22/2019    9:26 AM 10/31/2018    8:23 AM  Depression screen PHQ 2/9  Decreased Interest 0 1 0 0 0  Down, Depressed, Hopeless 0 0 0 0 0  PHQ - 2 Score 0 1 0 0 0  Altered sleeping 1 3     Tired, decreased energy 1 2     Change in appetite 1 2     Feeling bad or failure about yourself  0 0     Trouble concentrating 0 2     Moving slowly or fidgety/restless 0 0     Suicidal thoughts 0 0     PHQ-9 Score 3 10     Difficult doing work/chores Somewhat difficult        No Known Allergies Social History   Social History Narrative   Single- in a relationship. No children. 3 cats.    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   Hobbies: travel, reading, hiking   Feels safe in relationships   Past Medical History:  Diagnosis Date   Broken foot 08/2014   left   Headache(784.0) 2016   migraine w/o aura    Hyperlipidemia    Low vitamin D level    Lymphedema    foot   Migraines    Plantar fasciitis of left foot    Stress fracture of left foot 07/2015   confirmed with MRI   Past Surgical History:  Procedure Laterality Date   INTRAUTERINE DEVICE (IUD) INSERTION     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/01/14   INTRAUTERINE DEVICE INSERTION  12/04/2016   Kyleena    Family History  Problem Relation Age of Onset   Arthritis Mother    Colitis Mother    Hemachromatosis Brother    Heart disease Maternal Aunt        MI 55-56   Lung cancer Maternal Grandmother        smoker   Cancer Maternal Grandmother        kidney   Alzheimer's disease Maternal Grandfather    Leukemia Paternal Grandmother        unsure correct type of cancer   Heart failure Paternal Grandmother    Kidney failure Paternal  Grandfather        ? dialysis   Allergies as of 06/16/2022   No Known Allergies      Medication List        Accurate as of Jun 16, 2022  9:31 AM. If you have any questions, ask your nurse or doctor.          STOP taking these medications    metroNIDAZOLE 1 % gel Commonly known as: METROGEL Stopped by: Felix Pacini, DO       TAKE these medications    amitriptyline 50 MG tablet Commonly known as: ELAVIL Take 1 tablet (50 mg total) by mouth at bedtime.   doxycycline 100 MG tablet Commonly known as: VIBRA-TABS Take 1 tablet (100 mg total) by mouth 2 (two) times daily. Started by: Felix Pacini, DO   levonorgestrel 19.5 MG IUD Commonly known as: KYLEENA by Intrauterine route. Inserted 12-04-16   multivitamin with minerals Tabs tablet Take 1 tablet by mouth daily.   triamcinolone 55 MCG/ACT Aero nasal inhaler Commonly known as: NASACORT Place 2 sprays into the nose daily. Started by: Felix Pacini, DO   zolmitriptan 2.5 MG disintegrating tablet Commonly known as: ZOMIG-ZMT Take 1 tablet (2.5 mg total) by mouth as needed for migraine (may take repeat dose 2 hours later x1).        All  past medical history, surgical history, allergies, family history, immunizations andmedications were updated in the EMR today and reviewed under the history and medication portions of their EMR.     Review of Systems  Constitutional:  Negative for chills, fever and malaise/fatigue.  HENT:  Positive for congestion and ear pain. Negative for sinus pain and sore throat.   Respiratory:  Negative for cough, sputum production, shortness of breath and wheezing.   Skin:  Negative for rash.  Neurological: Negative.  Negative for headaches.  All other systems reviewed and are negative.  Negative, with the exception of above mentioned in HPI   Objective:  BP 113/81   Pulse 96   Temp 98.2 F (36.8 C)   Wt 274 lb 3.2 oz (124.4 kg)   SpO2 97%   BMI 44.26 kg/m  Body mass index is 44.26 kg/m. Physical Exam Vitals and nursing note reviewed.  Constitutional:      General: She is not in acute distress.    Appearance: Normal appearance. She is normal weight. She is not ill-appearing or toxic-appearing.  HENT:     Head: Normocephalic and atraumatic.     Right Ear: Tympanic membrane, ear canal and external ear normal.     Left Ear: Ear canal and external ear normal. A middle ear effusion is present. There is no impacted cerumen. No mastoid tenderness. Tympanic membrane is not injected, scarred, perforated, erythematous or retracted.     Nose: Mucosal edema, congestion and rhinorrhea present.     Left Turbinates: Enlarged.     Left Sinus: Maxillary sinus tenderness present.     Mouth/Throat:     Pharynx: No oropharyngeal exudate or posterior oropharyngeal erythema.  Eyes:     General: No scleral icterus.       Right eye: No discharge.        Left eye: No discharge.     Extraocular Movements: Extraocular movements intact.     Conjunctiva/sclera: Conjunctivae normal.     Pupils: Pupils are equal, round, and reactive to light.      Comments: Left upper eyelid skin tag along eyelash line in visual  field.  Musculoskeletal:     Cervical back: Neck supple.  Lymphadenopathy:     Cervical: No cervical adenopathy.  Skin:    Findings: No rash.  Neurological:     Mental Status: She is alert and oriented to person, place, and time. Mental status is at baseline.     Motor: No weakness.     Coordination: Coordination normal.     Gait: Gait normal.  Psychiatric:        Mood and Affect: Mood normal.        Behavior: Behavior normal.        Thought Content: Thought content normal.        Judgment: Judgment normal.     No results found. No results found. No results found for this or any previous visit (from the past 24 hour(s)).  Assessment/Plan: Wendy Molina is a 37 y.o. female present for OV for  Skin tag - Ambulatory referral to Ophthalmology - could consider plastics referral of Oph does not work out.  Acute non-recurrent maxillary sinusitis/Dysfunction of left eustachian tube Rest, hydrate.  Start Nasacort, nasal saline and zyrtec daily Doxy bid prescribed, take until completed.  F/U 2 weeks if not improved.   Reviewed expectations re: course of current medical issues. Discussed self-management of symptoms. Outlined signs and symptoms indicating need for more acute intervention. Patient verbalized understanding and all questions were answered. Patient received an After-Visit Summary.    Orders Placed This Encounter  Procedures   Ambulatory referral to Ophthalmology   Meds ordered this encounter  Medications   triamcinolone (NASACORT) 55 MCG/ACT AERO nasal inhaler    Sig: Place 2 sprays into the nose daily.    Dispense:  1 each    Refill:  12   doxycycline (VIBRA-TABS) 100 MG tablet    Sig: Take 1 tablet (100 mg total) by mouth 2 (two) times daily.    Dispense:  20 tablet    Refill:  0   Referral Orders         Ambulatory referral to Ophthalmology       Note is dictated utilizing voice recognition software. Although note has been proof read prior to  signing, occasional typographical errors still can be missed. If any questions arise, please do not hesitate to call for verification.   electronically signed by:  Felix Pacini, DO  [facility] - [place]

## 2022-08-31 ENCOUNTER — Ambulatory Visit: Payer: BC Managed Care – PPO | Admitting: Family Medicine

## 2022-08-31 ENCOUNTER — Encounter: Payer: Self-pay | Admitting: Family Medicine

## 2022-08-31 VITALS — BP 120/84 | HR 100 | Temp 98.0°F | Wt 269.8 lb

## 2022-08-31 DIAGNOSIS — L819 Disorder of pigmentation, unspecified: Secondary | ICD-10-CM

## 2022-08-31 NOTE — Progress Notes (Signed)
Wendy Molina , 30-Aug-1985, 37 y.o., female MRN: [id number] Patient Care Team    Relationship Specialty Notifications Start End  Natalia Leatherwood, DO PCP - General Family Medicine  02/11/15   Patton Salles, MD Consulting Physician Obstetrics and Gynecology  10/26/16     Chief Complaint  Patient presents with   Skin Tag    Been there for a while; has protruded more and is now discolored. Under right breast near bra line     Subjective: Wendy Molina is a 37 y.o. Pt presents for an OV with complaints of changing skin lesion under her right breast. She noticed it was becoming larger in the shower about 1 week ago. She then looked at in the mirror and noticed a color change. She reports the lesion has been present for many years.   No personal or fhx of skin cancer     08/31/2022    1:15 PM 06/16/2022    8:55 AM 11/25/2021    9:42 AM 11/22/2020    9:13 AM 11/22/2019    9:26 AM  Depression screen PHQ 2/9  Decreased Interest 1 0 1 0 0  Down, Depressed, Hopeless 1 0 0 0 0  PHQ - 2 Score 2 0 1 0 0  Altered sleeping 2 1 3     Tired, decreased energy 1 1 2     Change in appetite 1 1 2     Feeling bad or failure about yourself  0 0 0    Trouble concentrating 1 0 2    Moving slowly or fidgety/restless 0 0 0    Suicidal thoughts 0 0 0    PHQ-9 Score 7 3 10     Difficult doing work/chores  Somewhat difficult       No Known Allergies Social History   Social History Narrative   Single- in a relationship. No children. 3 cats.    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   Hobbies: travel, reading, hiking   Feels safe in relationships   Past Medical History:  Diagnosis Date   Broken foot 08/2014   left   Headache(784.0) 2016   migraine w/o aura   Hyperlipidemia    Low vitamin D level    Lymphedema    foot   Migraines    Plantar fasciitis of left foot    Stress fracture of  left foot 07/2015   confirmed with MRI   Past Surgical History:  Procedure Laterality Date   INTRAUTERINE DEVICE (IUD) INSERTION     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/01/14   INTRAUTERINE DEVICE INSERTION  12/04/2016   Kyleena    Family History  Problem Relation Age of Onset   Arthritis Mother    Colitis Mother    Hemachromatosis Brother    Heart disease Maternal Aunt        MI 55-56   Lung cancer Maternal Grandmother        smoker   Cancer Maternal Grandmother        kidney   Alzheimer's disease Maternal Grandfather    Leukemia Paternal Grandmother        unsure correct type of cancer   Heart failure Paternal Grandmother    Kidney failure Paternal Grandfather        ? dialysis   Allergies as of 08/31/2022   No Known Allergies  Medication List        Accurate as of August 31, 2022  1:33 PM. If you have any questions, ask your nurse or doctor.          amitriptyline 50 MG tablet Commonly known as: ELAVIL Take 1 tablet (50 mg total) by mouth at bedtime.   doxycycline 100 MG tablet Commonly known as: VIBRA-TABS Take 1 tablet (100 mg total) by mouth 2 (two) times daily.   levonorgestrel 19.5 MG IUD Commonly known as: KYLEENA by Intrauterine route. Inserted 12-04-16   multivitamin with minerals Tabs tablet Take 1 tablet by mouth daily.   triamcinolone 55 MCG/ACT Aero nasal inhaler Commonly known as: NASACORT Place 2 sprays into the nose daily.   zolmitriptan 2.5 MG disintegrating tablet Commonly known as: ZOMIG-ZMT Take 1 tablet (2.5 mg total) by mouth as needed for migraine (may take repeat dose 2 hours later x1).        All past medical history, surgical history, allergies, family history, immunizations andmedications were updated in the EMR today and reviewed under the history and medication portions of their EMR.     ROS Negative, with the exception of above mentioned in HPI   Objective:  BP 120/84   Pulse 100   Temp 98 F (36.7 C)   Wt 269 lb 12.8 oz  (122.4 kg)   SpO2 96%   BMI 43.55 kg/m  Body mass index is 43.55 kg/m. Physical Exam Vitals and nursing note reviewed.  Constitutional:      General: She is not in acute distress.    Appearance: Normal appearance. She is normal weight. She is not ill-appearing or toxic-appearing.  HENT:     Head: Normocephalic and atraumatic.  Eyes:     General: No scleral icterus.       Right eye: No discharge.        Left eye: No discharge.     Extraocular Movements: Extraocular movements intact.     Conjunctiva/sclera: Conjunctivae normal.     Pupils: Pupils are equal, round, and reactive to light.  Skin:    Findings: Lesion (flesh toned warty skin lesion ~1 cm pedunculated, under right breast bra-line. Cental irritation present with scabbing.) present. No rash.  Neurological:     Mental Status: She is alert and oriented to person, place, and time. Mental status is at baseline.     Motor: No weakness.     Coordination: Coordination normal.     Gait: Gait normal.  Psychiatric:        Mood and Affect: Mood normal.        Behavior: Behavior normal.        Thought Content: Thought content normal.        Judgment: Judgment normal.      No results found. No results found. No results found for this or any previous visit (from the past 24 hour(s)).  Assessment/Plan: Wendy Molina is a 37 y.o. female present for OV for  Change in color of pigmented skin lesion - Plan: Ambulatory referral to Dermatology - verrucous lesion with irritation.  - pt desired derm referral to est for routine skin checks and removal of current lesion.   Reviewed expectations re: course of current medical issues. Discussed self-management of symptoms. Outlined signs and symptoms indicating need for more acute intervention. Patient verbalized understanding and all questions were answered. Patient received an After-Visit Summary.    Orders Placed This Encounter  Procedures   Ambulatory referral to  Dermatology  No orders of the defined types were placed in this encounter.  Referral Orders         Ambulatory referral to Dermatology       Note is dictated utilizing voice recognition software. Although note has been proof read prior to signing, occasional typographical errors still can be missed. If any questions arise, please do not hesitate to call for verification.   electronically signed by:  Felix Pacini, DO   [facility] - [place]

## 2022-08-31 NOTE — Patient Instructions (Addendum)

## 2022-10-19 ENCOUNTER — Ambulatory Visit: Payer: BC Managed Care – PPO | Admitting: Obstetrics and Gynecology

## 2022-10-19 VITALS — BP 126/86 | HR 85 | Ht 65.75 in | Wt 265.6 lb

## 2022-10-19 DIAGNOSIS — R350 Frequency of micturition: Secondary | ICD-10-CM | POA: Diagnosis not present

## 2022-10-19 DIAGNOSIS — N760 Acute vaginitis: Secondary | ICD-10-CM

## 2022-10-19 DIAGNOSIS — N898 Other specified noninflammatory disorders of vagina: Secondary | ICD-10-CM | POA: Diagnosis not present

## 2022-10-19 DIAGNOSIS — R102 Pelvic and perineal pain: Secondary | ICD-10-CM | POA: Diagnosis not present

## 2022-10-19 LAB — WET PREP FOR TRICH, YEAST, CLUE

## 2022-10-19 MED ORDER — METRONIDAZOLE 0.75 % VA GEL: 1.0000 | Freq: Every day | VAGINAL | 0 refills | Status: DC

## 2022-10-19 MED ORDER — SULFAMETHOXAZOLE-TRIMETHOPRIM 800-160 MG PO TABS: 1.0000 | ORAL_TABLET | Freq: Two times a day (BID) | ORAL | 0 refills | Status: DC

## 2022-10-19 NOTE — Progress Notes (Signed)
GYNECOLOGY  VISIT   HPI: 37 y.o.   Domestic Partner  Caucasian  female   G0P0000 with No LMP recorded (lmp unknown). (Menstrual status: IUD).   here for UTI. Pain with urination for 2 days.  Sharp pain.  Some back pain.  Slight nausea.  No blood in urine.  Some chills.  No known fever.   E Coli UTI 10/01/20.   No vaginal discharge.   Last menses was 2 - 3 months ago.  Lasted 2 days.   GYNECOLOGIC HISTORY: No LMP recorded (lmp unknown). (Menstrual status: IUD). Contraception:  Hazle Quant IUD placed 12/25/21. Menopausal hormone therapy:  none Last mammogram:  none Last pap smear:   10-02-21 neg        OB History     Gravida  0   Para  0   Term  0   Preterm  0   AB  0   Living  0      SAB  0   IAB  0   Ectopic  0   Multiple  0   Live Births  0              Patient Active Problem List   Diagnosis Date Noted   Rosacea 02/06/2021   Elevated LFTs 02/15/2020   Hyperlipidemia    Encounter for long-term current use of medication 10/23/2015   Obesity 10/23/2015   Vitamin D deficiency 02/12/2015   Chronic migraine w/o aura w/o status migrainosus, not intractable     Past Medical History:  Diagnosis Date   Broken foot 08/2014   left   Headache(784.0) 2016   migraine w/o aura   Hyperlipidemia    Low vitamin D level    Lymphedema    foot   Migraines    Plantar fasciitis of left foot    Stress fracture of left foot 07/2015   confirmed with MRI    Past Surgical History:  Procedure Laterality Date   INTRAUTERINE DEVICE (IUD) INSERTION     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/01/14   INTRAUTERINE DEVICE INSERTION  12/04/2016   Kyleena     Current Outpatient Medications  Medication Sig Dispense Refill   amitriptyline (ELAVIL) 50 MG tablet Take 1 tablet (50 mg total) by mouth at bedtime. 90 tablet 3   doxycycline (VIBRA-TABS) 100 MG tablet Take 1 tablet (100 mg total) by mouth 2 (two) times daily. 20 tablet 0   levonorgestrel (KYLEENA) 19.5 MG IUD by  Intrauterine route. Inserted 12-04-16     Multiple Vitamin (MULTIVITAMIN WITH MINERALS) TABS tablet Take 1 tablet by mouth daily.     triamcinolone (NASACORT) 55 MCG/ACT AERO nasal inhaler Place 2 sprays into the nose daily. 1 each 12   zolmitriptan (ZOMIG-ZMT) 2.5 MG disintegrating tablet Take 1 tablet (2.5 mg total) by mouth as needed for migraine (may take repeat dose 2 hours later x1). 10 tablet 11   No current facility-administered medications for this visit.     ALLERGIES: Patient has no known allergies.  Family History  Problem Relation Age of Onset   Arthritis Mother    Colitis Mother    Hemachromatosis Brother    Heart disease Maternal Aunt        MI 22-56   Lung cancer Maternal Grandmother        smoker   Cancer Maternal Grandmother        kidney   Alzheimer's disease Maternal Grandfather    Leukemia Paternal Grandmother  unsure correct type of cancer   Heart failure Paternal Grandmother    Kidney failure Paternal Grandfather        ? dialysis    Social History   Socioeconomic History   Marital status: Media planner    Spouse name: Not on file   Number of children: 0   Years of education: Not on file   Highest education level: Bachelor's degree (e.g., BA, AB, BS)  Occupational History   Occupation: mortgage insurance  Tobacco Use   Smoking status: Never   Smokeless tobacco: Never  Vaping Use   Vaping status: Never Used  Substance and Sexual Activity   Alcohol use: Yes    Alcohol/week: 5.0 - 6.0 standard drinks of alcohol    Types: 5 - 6 Standard drinks or equivalent per week   Drug use: No   Sexual activity: Yes    Partners: Male    Birth control/protection: I.U.D.    Comment: kyleena 12-04-16  Other Topics Concern   Not on file  Social History Narrative   Single- in a relationship. No children. 3 cats.    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   Hobbies:  travel, reading, hiking   Feels safe in relationships   Social Determinants of Health   Financial Resource Strain: Low Risk  (06/14/2022)   Overall Financial Resource Strain (CARDIA)    Difficulty of Paying Living Expenses: Not very hard  Food Insecurity: No Food Insecurity (06/14/2022)   Hunger Vital Sign    Worried About Running Out of Food in the Last Year: Never true    Ran Out of Food in the Last Year: Never true  Transportation Needs: No Transportation Needs (06/14/2022)   PRAPARE - Administrator, Civil Service (Medical): No    Lack of Transportation (Non-Medical): No  Physical Activity: Insufficiently Active (06/14/2022)   Exercise Vital Sign    Days of Exercise per Week: 1 day    Minutes of Exercise per Session: 20 min  Stress: Stress Concern Present (06/14/2022)   [employer] of Occupational Health - Occupational Stress Questionnaire    Feeling of Stress : To some extent  Social Connections: Socially Isolated (06/14/2022)   Social Connection and Isolation Panel [NHANES]    Frequency of Communication with Friends and Family: Once a week    Frequency of Social Gatherings with Friends and Family: Once a week    Attends Religious Services: Never    Database administrator or Organizations: No    Attends Engineer, structural: Not on file    Marital Status: Living with partner  Intimate Partner Violence: Not on file    Review of Systems  See HPI.  PHYSICAL EXAMINATION:    BP 126/86   Pulse 85   Ht 5' 5.75" (1.67 m)   Wt 265 lb 9.6 oz (120.5 kg)   LMP  (LMP Unknown) Comment: havent gotten one in 2-3 months  SpO2 97%   BMI 43.20 kg/m     General appearance: alert, cooperative and appears stated age   Pelvic: External genitalia:  no lesions              Urethra:  normal appearing urethra with no masses, tenderness or lesions              Bartholins and Skenes: normal  Vagina: normal appearing vagina with normal color and discharge,  no lesions.  Vaginal odor present.               Cervix: no lesions.  IUD strings noted.                Bimanual Exam:  Uterus:  normal size, contour, position, consistency, mobility, non-tender              Adnexa: no mass, fullness, tenderness          Chaperone was present for exam:  B'Aisha B, CMA    ASSESSMENT  Dysuria.  Vaginal odor.    PLAN  Urinalysis:  sg 1.025, pH 6.0, 1+ leukocytes, 10 - 20 WBC, NS RBC, 6 - 10 squams, moderate bacteria, clue cells noted. UC sent.  Wet prep:  clue cells present.  No yeast.  No trich. Bactrim DS po bid x 3 days.  Metrogel pv at hs x 5 nights.  FU for annual exam and prn.   21 min  total time was spent for this patient encounter, including preparation, face-to-face counseling with the patient, coordination of care, and documentation of the encounter.

## 2022-10-22 LAB — CULTURE INDICATED

## 2022-10-22 LAB — URINALYSIS, COMPLETE W/RFL CULTURE
Bilirubin Urine: NEGATIVE
Glucose, UA: NEGATIVE
Hgb urine dipstick: NEGATIVE
Hyaline Cast: NONE SEEN /LPF
Ketones, ur: NEGATIVE
Nitrites, Initial: NEGATIVE
Protein, ur: NEGATIVE
RBC / HPF: NONE SEEN /HPF (ref 0–2)
Specific Gravity, Urine: 1.025 (ref 1.001–1.035)
pH: 6 (ref 5.0–8.0)

## 2022-10-22 LAB — URINE CULTURE
MICRO NUMBER:: 15502070
SPECIMEN QUALITY:: ADEQUATE

## 2022-10-23 ENCOUNTER — Encounter: Payer: Self-pay | Admitting: Obstetrics and Gynecology

## 2022-11-30 ENCOUNTER — Encounter: Payer: Self-pay | Admitting: Family Medicine

## 2022-11-30 ENCOUNTER — Ambulatory Visit: Payer: BC Managed Care – PPO | Admitting: Family Medicine

## 2022-11-30 VITALS — BP 118/82 | HR 104 | Temp 98.1°F | Ht 67.0 in | Wt 266.0 lb

## 2022-11-30 DIAGNOSIS — Z0000 Encounter for general adult medical examination without abnormal findings: Secondary | ICD-10-CM | POA: Diagnosis not present

## 2022-11-30 DIAGNOSIS — Z23 Encounter for immunization: Secondary | ICD-10-CM | POA: Diagnosis not present

## 2022-11-30 DIAGNOSIS — G43709 Chronic migraine without aura, not intractable, without status migrainosus: Secondary | ICD-10-CM | POA: Diagnosis not present

## 2022-11-30 DIAGNOSIS — Z131 Encounter for screening for diabetes mellitus: Secondary | ICD-10-CM | POA: Diagnosis not present

## 2022-11-30 DIAGNOSIS — E782 Mixed hyperlipidemia: Secondary | ICD-10-CM | POA: Diagnosis not present

## 2022-11-30 DIAGNOSIS — E559 Vitamin D deficiency, unspecified: Secondary | ICD-10-CM | POA: Diagnosis not present

## 2022-11-30 LAB — CBC WITH DIFFERENTIAL/PLATELET
Basophils Absolute: 0.1 K/uL (ref 0.0–0.1)
Basophils Relative: 0.6 % (ref 0.0–3.0)
Eosinophils Absolute: 0.1 K/uL (ref 0.0–0.7)
Eosinophils Relative: 0.5 % (ref 0.0–5.0)
HCT: 45.6 % (ref 36.0–46.0)
Hemoglobin: 14.9 g/dL (ref 12.0–15.0)
Lymphocytes Relative: 18.7 % (ref 12.0–46.0)
Lymphs Abs: 2.1 K/uL (ref 0.7–4.0)
MCHC: 32.6 g/dL (ref 30.0–36.0)
MCV: 94.4 fl (ref 78.0–100.0)
Monocytes Absolute: 0.8 K/uL (ref 0.1–1.0)
Monocytes Relative: 6.9 % (ref 3.0–12.0)
Neutro Abs: 8.3 K/uL — ABNORMAL HIGH (ref 1.4–7.7)
Neutrophils Relative %: 73.3 % (ref 43.0–77.0)
Platelets: 275 K/uL (ref 150.0–400.0)
RBC: 4.83 Mil/uL (ref 3.87–5.11)
RDW: 13.5 % (ref 11.5–15.5)
WBC: 11.3 K/uL — ABNORMAL HIGH (ref 4.0–10.5)

## 2022-11-30 LAB — COMPREHENSIVE METABOLIC PANEL WITH GFR
ALT: 20 U/L (ref 0–35)
AST: 16 U/L (ref 0–37)
Albumin: 4.2 g/dL (ref 3.5–5.2)
Alkaline Phosphatase: 76 U/L (ref 39–117)
BUN: 12 mg/dL (ref 6–23)
CO2: 26 meq/L (ref 19–32)
Calcium: 9.4 mg/dL (ref 8.4–10.5)
Chloride: 103 meq/L (ref 96–112)
Creatinine, Ser: 0.74 mg/dL (ref 0.40–1.20)
GFR: 102.88 mL/min
Glucose, Bld: 95 mg/dL (ref 70–99)
Potassium: 4.4 meq/L (ref 3.5–5.1)
Sodium: 138 meq/L (ref 135–145)
Total Bilirubin: 0.8 mg/dL (ref 0.2–1.2)
Total Protein: 6.9 g/dL (ref 6.0–8.3)

## 2022-11-30 LAB — LIPID PANEL
Cholesterol: 156 mg/dL (ref 0–200)
HDL: 48.9 mg/dL (ref >39.00)
LDL Cholesterol: 74 mg/dL (ref 0–99)
NonHDL: 107.51
Total CHOL/HDL Ratio: 3
Triglycerides: 166 mg/dL — ABNORMAL HIGH (ref 0.0–149.0)
VLDL: 33.2 mg/dL (ref 0.0–40.0)

## 2022-11-30 LAB — TSH: TSH: 2.53 u[IU]/mL (ref 0.35–5.50)

## 2022-11-30 LAB — HEMOGLOBIN A1C: Hgb A1c MFr Bld: 4.9 % (ref 4.6–6.5)

## 2022-11-30 LAB — VITAMIN D 25 HYDROXY (VIT D DEFICIENCY, FRACTURES): VITD: 29.37 ng/mL — ABNORMAL LOW (ref 30.00–100.00)

## 2022-11-30 MED ORDER — ZOLMITRIPTAN 2.5 MG PO TBDP: 2.5000 mg | ORAL_TABLET | ORAL | 11 refills | Status: DC | PRN

## 2022-11-30 MED ORDER — AMITRIPTYLINE HCL 50 MG PO TABS: 50.0000 mg | ORAL_TABLET | Freq: Every day | ORAL | 3 refills | Status: DC

## 2022-11-30 MED ORDER — AZITHROMYCIN 250 MG PO TABS: ORAL_TABLET | ORAL | 0 refills | Status: AC

## 2022-11-30 NOTE — Progress Notes (Signed)
Patient ID: Wendy Molina, female  DOB: 1985-02-24, 37 y.o.   MRN: [id number] Patient Care Team    Relationship Specialty Notifications Start End  Natalia Leatherwood, DO PCP - General Family Medicine  02/11/15   Patton Salles, MD Consulting Physician Obstetrics and Gynecology  10/26/16     Chief Complaint  Patient presents with   Annual Exam    Pt is fasting; c/o congestion, cough sore throat for 1 week    Subjective: Wendy Molina is a 37 y.o.  Female  present for CPE and Chronic Conditions/illness Management All past medical history, surgical history, allergies, family history, immunizations, medications and social history were updated in the electronic medical record today. All recent labs, ED visits and hospitalizations within the last year were reviewed.  Migraine Patient reports compliance with her amitriptyline 50 mg before bed.  She has not routinely needed the Zomig when taking the amitriptyline.   Vit d def: Pt reports compliance with vit d daily supplement daily   Health maintenance: Colon cancer screen: No Fhx, screen at 45 Mammogram: No Fhx screen at 40 Cervical cancer screening: last pap: 09/2021,  Dr. Edward Jolly, Normal 5-year follow-up. Immunizations: tdap-declined,  Influenza-declined(encouraged yearly) Infectious disease screening: HIV and Hep c completed DEXA: Recommend screening 55-60 with history of vitamin D deficiency chronically.      11/30/2022    9:37 AM 08/31/2022    1:15 PM 06/16/2022    8:55 AM 11/25/2021    9:42 AM 11/22/2020    9:13 AM  Depression screen PHQ 2/9  Decreased Interest 0 1 0 1 0  Down, Depressed, Hopeless 0 1 0 0 0  PHQ - 2 Score 0 2 0 1 0  Altered sleeping  2 1 3    Tired, decreased energy  1 1 2    Change in appetite  1 1 2    Feeling bad or failure about yourself   0 0 0   Trouble concentrating  1 0 2   Moving slowly or fidgety/restless  0 0 0   Suicidal thoughts  0 0 0   PHQ-9 Score  7 3 10    Difficult doing  work/chores   Somewhat difficult        08/31/2022    1:15 PM 06/16/2022    8:55 AM 11/25/2021    9:43 AM  GAD 7 : Generalized Anxiety Score  Nervous, Anxious, on Edge 1 1 2   Control/stop worrying 1 0 1  Worry too much - different things 1 0 1  Trouble relaxing 1 1 1   Restless 0 0 0  Easily annoyed or irritable 1 1 1   Afraid - awful might happen 0 0 0  Total GAD 7 Score 5 3 6   Anxiety Difficulty  Not difficult at all      Immunization History  Administered Date(s) Administered   HPV 9-valent 10/01/2017, 11/26/2017, 09/21/2018   PFIZER(Purple Top)SARS-COV-2 Vaccination 04/27/2019, 05/23/2019, 06/27/2020   Tdap 06/22/2012    Past Medical History:  Diagnosis Date   Broken foot 08/2014   left   Headache(784.0) 2016   migraine w/o aura   Hyperlipidemia    Low vitamin D level    Lymphedema    foot   Migraines    Plantar fasciitis of left foot    Stress fracture of left foot 07/2015   confirmed with MRI   No Known Allergies Past Surgical History:  Procedure Laterality Date   INTRAUTERINE DEVICE (IUD) INSERTION  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/01/14   INTRAUTERINE DEVICE INSERTION  12/04/2016   Kyleena    Family History  Problem Relation Age of Onset   Arthritis Mother    Colitis Mother    Hemachromatosis Brother    Heart disease Maternal Aunt        MI 1-56   Lung cancer Maternal Grandmother        smoker   Cancer Maternal Grandmother        kidney   Alzheimer's disease Maternal Grandfather    Leukemia Paternal Grandmother        unsure correct type of cancer   Heart failure Paternal Grandmother    Kidney failure Paternal Grandfather        ? dialysis   Social History   Social History Narrative   Single- in a relationship. No children. 3 cats.    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   Hobbies: travel, reading, hiking   Feels safe in relationships    Allergies as of 11/30/2022    No Known Allergies      Medication List        Accurate as of November 30, 2022  9:49 AM. If you have any questions, ask your nurse or doctor.          STOP taking these medications    doxycycline 100 MG tablet Commonly known as: VIBRA-TABS Stopped by: Felix Pacini   sulfamethoxazole-trimethoprim 800-160 MG tablet Commonly known as: Bactrim DS Stopped by: Felix Pacini       TAKE these medications    amitriptyline 50 MG tablet Commonly known as: ELAVIL Take 1 tablet (50 mg total) by mouth at bedtime.   azithromycin 250 MG tablet Commonly known as: ZITHROMAX Take 2 tablets on day 1, then 1 tablet daily on days 2 through 5 Started by: Felix Pacini   levonorgestrel 19.5 MG IUD Commonly known as: KYLEENA by Intrauterine route. Inserted 12-04-16   metroNIDAZOLE 0.75 % vaginal gel Commonly known as: METROGEL Place 1 Applicatorful vaginally at bedtime.   multivitamin with minerals Tabs tablet Take 1 tablet by mouth daily.   triamcinolone 55 MCG/ACT Aero nasal inhaler Commonly known as: NASACORT Place 2 sprays into the nose daily.   zolmitriptan 2.5 MG disintegrating tablet Commonly known as: ZOMIG-ZMT Take 1 tablet (2.5 mg total) by mouth as needed for migraine (may take repeat dose 2 hours later x1).        All past medical history, surgical history, allergies, family history, immunizations andmedications were updated in the EMR today and reviewed under the history and medication portions of their EMR.      No results found.   ROS: 14 pt review of systems performed and negative (unless mentioned in an HPI)  Objective: BP 118/82   Pulse (!) 104   Temp 98.1 F (36.7 C)   Ht 5\' 7"  (1.702 m)   Wt 266 lb (120.7 kg)   SpO2 97%   BMI 41.66 kg/m  Physical Exam Vitals and nursing note reviewed.  Constitutional:      General: She is not in acute distress.    Appearance: Normal appearance. She is not ill-appearing or toxic-appearing.  HENT:     Head:  Normocephalic and atraumatic.     Right Ear: Tympanic membrane, ear canal and external ear normal. There is no impacted cerumen.     Left Ear: Tympanic membrane, ear canal and  external ear normal. There is no impacted cerumen.     Nose: No congestion or rhinorrhea.     Mouth/Throat:     Mouth: Mucous membranes are moist.     Pharynx: Oropharynx is clear. No oropharyngeal exudate or posterior oropharyngeal erythema.  Eyes:     General: No scleral icterus.       Right eye: No discharge.        Left eye: No discharge.     Extraocular Movements: Extraocular movements intact.     Conjunctiva/sclera: Conjunctivae normal.     Pupils: Pupils are equal, round, and reactive to light.  Cardiovascular:     Rate and Rhythm: Normal rate and regular rhythm.     Pulses: Normal pulses.     Heart sounds: Normal heart sounds. No murmur heard.    No friction rub. No gallop.  Pulmonary:     Effort: Pulmonary effort is normal. No respiratory distress.     Breath sounds: Normal breath sounds. No stridor. No wheezing, rhonchi or rales.  Chest:     Chest wall: No tenderness.  Abdominal:     General: Abdomen is flat. Bowel sounds are normal. There is no distension.     Palpations: Abdomen is soft. There is no mass.     Tenderness: There is no abdominal tenderness. There is no right CVA tenderness, left CVA tenderness, guarding or rebound.     Hernia: No hernia is present.  Musculoskeletal:        General: No swelling, tenderness or deformity. Normal range of motion.     Cervical back: Normal range of motion and neck supple. No rigidity or tenderness.     Right lower leg: No edema.     Left lower leg: No edema.  Lymphadenopathy:     Cervical: No cervical adenopathy.  Skin:    General: Skin is warm and dry.     Coloration: Skin is not jaundiced or pale.     Findings: No bruising, erythema, lesion or rash.  Neurological:     General: No focal deficit present.     Mental Status: She is alert and oriented  to person, place, and time. Mental status is at baseline.     Cranial Nerves: No cranial nerve deficit.     Sensory: No sensory deficit.     Motor: No weakness.     Coordination: Coordination normal.     Gait: Gait normal.     Deep Tendon Reflexes: Reflexes normal.  Psychiatric:        Mood and Affect: Mood normal.        Behavior: Behavior normal.        Thought Content: Thought content normal.        Judgment: Judgment normal.     No results found.  Assessment/plan: Wendy Molina is a 37 y.o. female present for CPE and chronic condition management Mixed hyperlipidemia/obesity Lipids collected today  Vitamin D deficiency Vitamin D collected today Encouraged her to make sure she is supplementing with 1000u vit d daily.  Chronic migraine w/o aura w/o status migrainosus, not intractable Stable Continue amitrip 50 at bedtime Continue zomig prn  Routine general medical examination at a health care facility Patient was encouraged to exercise greater than 150 minutes a week. Patient was encouraged to choose a diet filled with fresh fruits and vegetables, and lean meats. AVS provided to patient today for education/recommendation on gender specific health and safety maintenance. Colon cancer screen: No Fhx, screen at 45  Mammogram: No Fhx screen at 40 Cervical cancer screening: last pap: 09/2021,  Dr. Edward Jolly, Normal 5-year follow-up. Immunizations: tdap-declined,  Influenza-declined(encouraged yearly) Infectious disease screening: HIV and Hep c completed DEXA: Recommend screening 55-60 with history of vitamin D deficiency chronically.  Return in about 1 year (around 12/01/2023) for cpe (20 min), Routine chronic condition follow-up.   Orders Placed This Encounter  Procedures   CBC with Differential/Platelet   Hemoglobin A1c   Comprehensive metabolic panel   Lipid panel   TSH   VITAMIN D 25 Hydroxy (Vit-D Deficiency, Fractures)    Meds ordered this encounter  Medications    amitriptyline (ELAVIL) 50 MG tablet    Sig: Take 1 tablet (50 mg total) by mouth at bedtime.    Dispense:  90 tablet    Refill:  3   zolmitriptan (ZOMIG-ZMT) 2.5 MG disintegrating tablet    Sig: Take 1 tablet (2.5 mg total) by mouth as needed for migraine (may take repeat dose 2 hours later x1).    Dispense:  10 tablet    Refill:  11   azithromycin (ZITHROMAX) 250 MG tablet    Sig: Take 2 tablets on day 1, then 1 tablet daily on days 2 through 5    Dispense:  6 tablet    Refill:  0    Referral Orders  No referral(s) requested today     Electronically signed by: Felix Pacini, DO [facility] [place]

## 2022-11-30 NOTE — Patient Instructions (Addendum)
Return in about 1 year (around 12/01/2023) for cpe (20 min), Routine chronic condition follow-up.        Great to see you today.  I have refilled the medication(s) we provide.   If labs were collected or images ordered, we will inform you of  results once we have received them and reviewed. We will contact you either by echart message, or telephone call.  Please give ample time to the testing facility, and our office to run,  receive and review results. Please do not call inquiring of results, even if you can see them in your chart. We will contact you as soon as we are able. If it has been over 1 week since the test was completed, and you have not yet heard from [place], then please call us.    - echart message- for normal results that have been seen by the patient already.   - telephone call: abnormal results or if patient has not viewed results in their echart.  If a referral to a specialist was entered for you, please call us in 2 weeks if you have not heard from the specialist office to schedule.

## 2022-12-01 NOTE — Progress Notes (Addendum)
37 y.o. G0P0000 Domestic Partner Caucasian female here for annual exam.    Infrequent spotting.  A little spotting with intercourse.  No pain or discomfort.  Not sexually active often.   Had evaluation of post coital bleeding in the past with her prior IUD. Normal pelvic US 2022.  Neg GC/CT/trich 10/02/21. Normal pap and neg HR HPV 10/02/21.   Desires STD screening today.  PCP: Felix Pacini A, DO   No LMP recorded. (Menstrual status: IUD).           Sexually active: Yes.    The current method of family planning is IUD--Kyleena 12/25/21.    Menopausal hormone therapy:  n/a Exercising: No.   Smoker:  no  OB History  Gravida Para Term Preterm AB Living  0 0 0 0 0 0  SAB IAB Ectopic Multiple Live Births  0 0 0 0 0     HEALTH MAINTENANCE:    Component Value Date/Time   DIAGPAP  10/02/2021 1538    - Negative for intraepithelial lesion or malignancy (NILM)   DIAGPAP  09/17/2017 0000    NEGATIVE FOR INTRAEPITHELIAL LESIONS OR MALIGNANCY.   HPVHIGH Negative 10/02/2021 1538   ADEQPAP  10/02/2021 1538    Satisfactory for evaluation; transformation zone component PRESENT.   ADEQPAP  09/17/2017 0000    Satisfactory for evaluation  endocervical/transformation zone component PRESENT.    History of abnormal Pap or positive HPV:  no Mammogram: n/a Colonoscopy:  n/a Bone Density:  n/a  Result  n/a   Immunization History  Administered Date(s) Administered   HPV 9-valent 10/01/2017, 11/26/2017, 09/21/2018   PFIZER(Purple Top)SARS-COV-2 Vaccination 04/27/2019, 05/23/2019, 06/27/2020   Tdap 06/22/2012      reports that she has never smoked. She has never used smokeless tobacco. She reports current alcohol use of about 5.0 - 6.0 standard drinks of alcohol per week. She reports that she does not use drugs.  Past Medical History:  Diagnosis Date   Broken foot 08/2014   left   Headache(784.0) 2016   migraine w/o aura   Hyperlipidemia    Low vitamin D level    Lymphedema     foot   Migraines    Plantar fasciitis of left foot    Stress fracture of left foot 07/2015   confirmed with MRI    Past Surgical History:  Procedure Laterality Date   INTRAUTERINE DEVICE (IUD) INSERTION     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/01/14   INTRAUTERINE DEVICE INSERTION  12/04/2016   Kyleena     Current Outpatient Medications  Medication Sig Dispense Refill   amitriptyline (ELAVIL) 50 MG tablet Take 1 tablet (50 mg total) by mouth at bedtime. 90 tablet 3   levonorgestrel (KYLEENA) 19.5 MG IUD by Intrauterine route. Inserted 12/25/21     Multiple Vitamin (MULTIVITAMIN WITH MINERALS) TABS tablet Take 1 tablet by mouth daily.     triamcinolone (NASACORT) 55 MCG/ACT AERO nasal inhaler Place 2 sprays into the nose daily. 1 each 12   zolmitriptan (ZOMIG-ZMT) 2.5 MG disintegrating tablet Take 1 tablet (2.5 mg total) by mouth as needed for migraine (may take repeat dose 2 hours later x1). 10 tablet 11   No current facility-administered medications for this visit.    ALLERGIES: Patient has no known allergies.  Family History  Problem Relation Age of Onset   Arthritis Mother    Colitis Mother    Hemachromatosis Brother    Heart disease Maternal Aunt        MI 74-56  Lung cancer Maternal Grandmother        smoker   Cancer Maternal Grandmother        kidney   Alzheimer's disease Maternal Grandfather    Leukemia Paternal Grandmother        unsure correct type of cancer   Heart failure Paternal Grandmother    Kidney failure Paternal Grandfather        ? dialysis    Review of Systems  All other systems reviewed and are negative.   PHYSICAL EXAM:  BP 130/76 (BP Location: Left Arm, Patient Position: Sitting, Cuff Size: Large)   Pulse 91   Ht 5\' 7"  (1.702 m)   Wt 264 lb (119.7 kg)   SpO2 98%   BMI 41.35 kg/m     General appearance: alert, cooperative and appears stated age Head: normocephalic, without obvious abnormality, atraumatic Neck: no adenopathy, supple, symmetrical, trachea  midline and thyroid normal to inspection and palpation Lungs: clear to auscultation bilaterally Breasts: normal appearance, no masses or tenderness, bilateral nipple inversion (right more than left - old change), No nipple discharge or bleeding, No axillary adenopathy Heart: regular rate and rhythm Abdomen: soft, non-tender; no masses, no organomegaly Extremities: extremities normal, atraumatic, no cyanosis or edema Skin: skin color, texture, turgor normal. No rashes or lesions Lymph nodes: cervical, supraclavicular, and axillary nodes normal. Neurologic: grossly normal  Pelvic: External genitalia:  no lesions              No abnormal inguinal nodes palpated.              Urethra:  normal appearing urethra with no masses, tenderness or lesions              Bartholins and Skenes: normal                 Vagina: normal appearing vagina with normal color and discharge, no lesions              Cervix: no lesions.  IUD strings noted.  Menstrual flow noted.              Pap taken: No. Bimanual Exam:  Uterus:  normal size, contour, position, consistency, mobility, non-tender              Adnexa: no mass, fullness, tenderness        Chaperone was present for exam:  Warren Lacy, CMA   ASSESSMENT: Well woman visit with gynecologic exam Kyleena IUD.  Hx postcoital bleeding.  STD screening.  Migraine HA without aura.  On Elavil. Bilateral nipple inversion.  Candida of skin under left breast.  PLAN: Mammogram screening discussed. Self breast awareness reviewed. Pap and HRV collected:  No. Guidelines for Calcium, Vitamin D, regular exercise program including cardiovascular and weight bearing exercise. Medication refills:  Nystatin powder.  STD screening today. Follow up:  1 year and prn continued postcoital bleeding.    An After Visit Summary was provided to the patient.

## 2022-12-15 ENCOUNTER — Encounter: Payer: Self-pay | Admitting: Obstetrics and Gynecology

## 2022-12-15 ENCOUNTER — Ambulatory Visit (INDEPENDENT_AMBULATORY_CARE_PROVIDER_SITE_OTHER): Payer: BC Managed Care – PPO | Admitting: Obstetrics and Gynecology

## 2022-12-15 ENCOUNTER — Other Ambulatory Visit (HOSPITAL_COMMUNITY)
Admission: RE | Admit: 2022-12-15 | Discharge: 2022-12-15 | Disposition: A | Discharge status: Home / Self Care | Payer: BC Managed Care – PPO | Source: Ambulatory Visit | Attending: Obstetrics and Gynecology | Admitting: Obstetrics and Gynecology

## 2022-12-15 VITALS — BP 130/76 | HR 91 | Ht 67.0 in | Wt 264.0 lb

## 2022-12-15 DIAGNOSIS — Z114 Encounter for screening for human immunodeficiency virus [HIV]: Secondary | ICD-10-CM | POA: Diagnosis not present

## 2022-12-15 DIAGNOSIS — Z113 Encounter for screening for infections with a predominantly sexual mode of transmission: Secondary | ICD-10-CM

## 2022-12-15 DIAGNOSIS — Z01419 Encounter for gynecological examination (general) (routine) without abnormal findings: Secondary | ICD-10-CM

## 2022-12-15 DIAGNOSIS — Z1159 Encounter for screening for other viral diseases: Secondary | ICD-10-CM

## 2022-12-15 DIAGNOSIS — B372 Candidiasis of skin and nail: Secondary | ICD-10-CM

## 2022-12-15 MED ORDER — NYSTATIN 100000 UNIT/GM EX POWD: 1.0000 | Freq: Three times a day (TID) | CUTANEOUS | 2 refills | Status: DC

## 2022-12-15 NOTE — Addendum Note (Signed)
Addended by: Ardell Isaacs, Debbe Bales E on: 12/15/2022 01:12 PM   Modules accepted: Orders

## 2022-12-15 NOTE — Patient Instructions (Signed)

## 2022-12-16 LAB — CERVICOVAGINAL ANCILLARY ONLY
Chlamydia: NEGATIVE
Comment: NEGATIVE
Comment: NEGATIVE
Comment: NORMAL
Neisseria Gonorrhea: NEGATIVE
Trichomonas: NEGATIVE

## 2022-12-17 LAB — RPR: RPR Ser Ql: NONREACTIVE

## 2022-12-17 LAB — HIV ANTIBODY (ROUTINE TESTING W REFLEX): HIV 1&2 Ab, 4th Generation: NONREACTIVE

## 2022-12-17 LAB — HEPATITIS C ANTIBODY: Hepatitis C Ab: NONREACTIVE

## 2022-12-28 ENCOUNTER — Encounter: Payer: Self-pay | Admitting: Family Medicine

## 2022-12-28 ENCOUNTER — Telehealth: Payer: BC Managed Care – PPO | Admitting: Family Medicine

## 2022-12-28 DIAGNOSIS — B9689 Other specified bacterial agents as the cause of diseases classified elsewhere: Secondary | ICD-10-CM | POA: Diagnosis not present

## 2022-12-28 DIAGNOSIS — J329 Chronic sinusitis, unspecified: Secondary | ICD-10-CM | POA: Diagnosis not present

## 2022-12-28 MED ORDER — DOXYCYCLINE HYCLATE 100 MG PO TABS: 100.0000 mg | ORAL_TABLET | Freq: Two times a day (BID) | ORAL | 0 refills | Status: DC

## 2022-12-28 NOTE — Patient Instructions (Signed)

## 2022-12-28 NOTE — Progress Notes (Signed)
VIRTUAL VISIT VIA VIDEO  I connected with Wendy Molina on 12/28/22 at  2:40 PM EST by a video enabled telemedicine application and verified that I am speaking with the correct person using two identifiers. Location patient: Home Location provider: [facility], Office Persons participating in the virtual visit: Patient, Dr. Claiborne Billings and Wendy Molina, CMA  I discussed the limitations of evaluation and management by telemedicine and the availability of in person appointments. The patient expressed understanding and agreed to proceed.     Wendy Molina , 12-06-1985, 37 y.o., female MRN: [id number] Patient Care Team    Relationship Specialty Notifications Start End  Wendy Leatherwood, DO PCP - General Family Medicine  02/11/15   Wendy Salles, MD Consulting Physician Obstetrics and Gynecology  10/26/16     Chief Complaint  Patient presents with   Cough    Onset Sat; no testing completed     Subjective: Wendy Molina is a 37 y.o. Pt presents for an OV with complaints of cough and fatigue of 6 days duration.  Associated symptoms include sore throat, congestion and postnasal drip.  She was exposed to some younger children at her ill recently.  She reports she has not been taking her Zyrtec for the last 2 weeks. She denies fevers, chills or GI symptoms. She states the symptoms are worsening over the last few days instead of improving.       12/15/2022    9:41 AM 11/30/2022    9:37 AM 08/31/2022    1:15 PM 06/16/2022    8:55 AM 11/25/2021    9:42 AM  Depression screen PHQ 2/9  Decreased Interest 0 0 1 0 1  Down, Depressed, Hopeless 0 0 1 0 0  PHQ - 2 Score 0 0 2 0 1  Altered sleeping   2 1 3   Tired, decreased energy   1 1 2   Change in appetite   1 1 2   Feeling bad or failure about yourself    0 0 0  Trouble concentrating   1 0 2  Moving slowly or fidgety/restless   0 0 0  Suicidal thoughts   0 0 0  PHQ-9 Score   7 3 10   Difficult doing work/chores     Somewhat difficult     No Known Allergies Social History   Social History Narrative   Single- in a relationship. No children. 3 cats.    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   Hobbies: travel, reading, hiking   Feels safe in relationships   Past Medical History:  Diagnosis Date   Broken foot 08/2014   left   Headache(784.0) 2016   migraine w/o aura   Hyperlipidemia    Low vitamin D level    Lymphedema    foot   Migraines    Plantar fasciitis of left foot    Stress fracture of left foot 07/2015   confirmed with MRI   Past Surgical History:  Procedure Laterality Date   INTRAUTERINE DEVICE (IUD) INSERTION     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/01/14   INTRAUTERINE DEVICE INSERTION  12/04/2016   Kyleena    Family History  Problem Relation Age of Onset   Arthritis Mother    Colitis Mother    Hemachromatosis Brother    Heart disease Maternal Aunt  MI 55-56   Lung cancer Maternal Grandmother        smoker   Cancer Maternal Grandmother        kidney   Alzheimer's disease Maternal Grandfather    Leukemia Paternal Grandmother        unsure correct type of cancer   Heart failure Paternal Grandmother    Kidney failure Paternal Grandfather        ? dialysis   Allergies as of 12/28/2022   No Known Allergies      Medication List        Accurate as of December 28, 2022  2:56 PM. If you have any questions, ask your nurse or doctor.          amitriptyline 50 MG tablet Commonly known as: ELAVIL Take 1 tablet (50 mg total) by mouth at bedtime.   doxycycline 100 MG tablet Commonly known as: VIBRA-TABS Take 1 tablet (100 mg total) by mouth 2 (two) times daily. Started by: Felix Pacini   levonorgestrel 19.5 MG IUD Commonly known as: KYLEENA by Intrauterine route. Inserted 12/25/21   multivitamin with minerals Tabs tablet Take 1 tablet by mouth daily.   nystatin powder Commonly known  as: MYCOSTATIN/NYSTOP Apply 1 Application topically 3 (three) times daily. Apply to affected area for up to 7 days   triamcinolone 55 MCG/ACT Aero nasal inhaler Commonly known as: NASACORT Place 2 sprays into the nose daily.   zolmitriptan 2.5 MG disintegrating tablet Commonly known as: ZOMIG-ZMT Take 1 tablet (2.5 mg total) by mouth as needed for migraine (may take repeat dose 2 hours later x1).        All past medical history, surgical history, allergies, family history, immunizations andmedications were updated in the EMR today and reviewed under the history and medication portions of their EMR.     ROS Negative, with the exception of above mentioned in HPI   Objective:  There were no vitals taken for this visit. There is no height or weight on file to calculate BMI.  Physical Exam Vitals and nursing note reviewed.  Constitutional:      General: She is not in acute distress.    Appearance: Normal appearance. She is not toxic-appearing.  HENT:     Head: Normocephalic and atraumatic.     Nose: Congestion present.  Eyes:     General: No scleral icterus.       Right eye: No discharge.        Left eye: No discharge.     Conjunctiva/sclera: Conjunctivae normal.  Pulmonary:     Effort: Pulmonary effort is normal.     Comments: Cough present on exam Musculoskeletal:     Cervical back: Normal range of motion.  Skin:    Findings: No rash.  Neurological:     Mental Status: She is alert and oriented to person, place, and time. Mental status is at baseline.  Psychiatric:        Mood and Affect: Mood normal.        Behavior: Behavior normal.        Thought Content: Thought content normal.        Judgment: Judgment normal.      No results found. No results found. No results found for this or any previous visit (from the past 24 hour(s)).  Assessment/Plan: ARABIA Molina is a 37 y.o. female present for OV for  Bacterial sinusitis Rest, hydrate.  Restart  Nasacort, mucinex (DM if cough), nettie pot or nasal saline.  Restart Zyrtec nightly Doxy twice daily prescribed, take until completed.  If cough present it can last up to 6-8 weeks.  F/U 2 weeks if not improved.   Reviewed expectations re: course of current medical issues. Discussed self-management of symptoms. Outlined signs and symptoms indicating need for more acute intervention. Patient verbalized understanding and all questions were answered. Patient received an After-Visit Summary.    No orders of the defined types were placed in this encounter.  Meds ordered this encounter  Medications   doxycycline (VIBRA-TABS) 100 MG tablet    Sig: Take 1 tablet (100 mg total) by mouth 2 (two) times daily.    Dispense:  20 tablet    Refill:  0   Referral Orders  No referral(s) requested today     Note is dictated utilizing voice recognition software. Although note has been proof read prior to signing, occasional typographical errors still can be missed. If any questions arise, please do not hesitate to call for verification.   electronically signed by:  Felix Pacini, DO  [facility] - [place]

## 2023-01-11 ENCOUNTER — Encounter: Payer: Self-pay | Admitting: Dermatology

## 2023-01-11 ENCOUNTER — Ambulatory Visit: Payer: BC Managed Care – PPO | Admitting: Dermatology

## 2023-01-11 VITALS — BP 134/90 | HR 87

## 2023-01-11 DIAGNOSIS — B079 Viral wart, unspecified: Secondary | ICD-10-CM

## 2023-01-11 DIAGNOSIS — L918 Other hypertrophic disorders of the skin: Secondary | ICD-10-CM | POA: Diagnosis not present

## 2023-01-11 DIAGNOSIS — D239 Other benign neoplasm of skin, unspecified: Secondary | ICD-10-CM | POA: Diagnosis not present

## 2023-01-11 NOTE — Progress Notes (Signed)
Wendy Molina , 01-Mar-1985, 37 y.o., female MRN: [id number] Patient Care Team    Relationship Specialty Notifications Start End  Natalia Leatherwood, DO PCP - General Family Medicine  02/11/15   Patton Salles, MD Consulting Physician Obstetrics and Gynecology  10/26/16     Chief Complaint  Patient presents with   Muscle Pain    Thinks she pulled a muscle over the weekend which caused HA     Subjective: Wendy Molina is a 37 y.o. Pt presents for an OV with complaints of neck strain of 4 days duration.  Associated symptoms include headache. She reports she coughed hard with her recent cold and strained her neck. She has been using heat on her neck to help. .       12/15/2022    9:41 AM 11/30/2022    9:37 AM 08/31/2022    1:15 PM 06/16/2022    8:55 AM 11/25/2021    9:42 AM  Depression screen PHQ 2/9  Decreased Interest 0 0 1 0 1  Down, Depressed, Hopeless 0 0 1 0 0  PHQ - 2 Score 0 0 2 0 1  Altered sleeping   2 1 3   Tired, decreased energy   1 1 2   Change in appetite   1 1 2   Feeling bad or failure about yourself    0 0 0  Trouble concentrating   1 0 2  Moving slowly or fidgety/restless   0 0 0  Suicidal thoughts   0 0 0  PHQ-9 Score   7 3 10   Difficult doing work/chores    Somewhat difficult     No Known Allergies Social History   Social History Narrative   Single- in a relationship. No children. 3 cats.    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 dialysis   Allergies as of 01/12/2023   No Known Allergies      Medication List        Accurate as of January 12, 2023  1:23 PM. If you have  any questions, ask your nurse or doctor.          STOP taking these medications    doxycycline 100 MG tablet Commonly known as: VIBRA-TABS Stopped by: Felix Pacini       TAKE these medications    amitriptyline 50 MG tablet Commonly known as: ELAVIL Take 1 tablet (50 mg total) by mouth at bedtime.   levonorgestrel 19.5 MG IUD Commonly known as: KYLEENA by Intrauterine route. Inserted 12/25/21   multivitamin with minerals Tabs tablet Take 1 tablet by mouth daily.   nystatin powder Commonly known as: MYCOSTATIN/NYSTOP Apply 1 Application topically 3 (three) times daily. Apply to affected area for up to 7 days   tiZANidine 4 MG tablet Commonly known as: Zanaflex Take 0.5-1 tablets (2-4 mg total) by mouth every 8 (eight) hours as needed for muscle spasms. Started by: Felix Pacini   triamcinolone 55 MCG/ACT Aero nasal inhaler Commonly known as: NASACORT Place 2 sprays into the nose daily.   zolmitriptan 2.5 MG disintegrating tablet Commonly known as: ZOMIG-ZMT Take 1 tablet (2.5 mg total) by mouth as needed for migraine (may take repeat dose 2 hours later x1).        All past medical history, surgical history, allergies, family history, immunizations  andmedications were updated in the EMR today and reviewed under the history and medication portions of their EMR.     ROS Negative, with the exception of above mentioned in HPI   Objective:  BP 130/84   Pulse 96   Temp 97.6 F (36.4 C)   Wt 266 lb 12.8 oz (121 kg)   LMP 01/04/2023   SpO2 97%   BMI 41.79 kg/m  Body mass index is 41.79 kg/m. Physical Exam Vitals and nursing note reviewed.  Constitutional:      General: She is not in acute distress.    Appearance: Normal appearance. She is normal weight. She is not ill-appearing or toxic-appearing.  HENT:     Head: Normocephalic and atraumatic.  Eyes:     General: No scleral icterus.       Right eye: No discharge.        Left eye: No discharge.     Extraocular Movements: Extraocular movements intact.     Conjunctiva/sclera: Conjunctivae normal.     Pupils: Pupils are equal, round, and reactive to light.  Musculoskeletal:        General: Tenderness present.     Comments: Muscle spasm present right posterior neck. Mild decrease ROM due to pain.   Skin:    Findings: No rash.  Neurological:     Mental Status: She is alert and oriented to person, place, and time. Mental status is at baseline.     Motor: No weakness.     Coordination: Coordination normal.     Gait: Gait normal.  Psychiatric:        Mood and Affect: Mood normal.        Behavior: Behavior normal.        Thought Content: Thought content normal.        Judgment: Judgment normal.      No results found. No results found. No results found for this or any previous visit (from the past 24 hours).  Assessment/Plan: [name] is a 37 y.o. female present for OV for  Strain of neck muscle, initial encounter (Primary)/Acute nonintractable headache, unspecified headache type Heat therapy Zanaflex 2-4 mg TID prn Light Stretches to start, using pain as guide . F/u prn  Need for Tdap vaccination Administered today   Reviewed expectations re: course of  current medical issues. Discussed self-management of symptoms. Outlined signs and symptoms indicating need for more acute intervention. Patient verbalized understanding and all questions were answered. Patient received an After-Visit Summary.    Orders Placed This Encounter  Procedures   Tdap vaccine greater than or equal to 7yo IM   Meds ordered this encounter  Medications   tiZANidine (ZANAFLEX) 4 MG tablet    Sig: Take 0.5-1 tablets (2-4 mg total) by mouth every 8 (eight) hours as needed for muscle spasms.    Dispense:  90 tablet    Refill:  0   Referral Orders  No referral(s) requested today     Note is dictated utilizing voice recognition software. Although note has been proof read prior to signing, occasional typographical errors still can be missed. If any questions arise, please do not hesitate to call for verification.   electronically signed by:  Felix Pacini, DO  [facility] [place]

## 2023-01-11 NOTE — Patient Instructions (Addendum)
Cryotherapy Aftercare  Wash gently with soap and water everyday.   Apply Vaseline and Band-Aid daily until healed.   Important Information   Due to recent changes in healthcare laws, you may see results of your pathology and/or laboratory studies on MyChart before the doctors have had a chance to review them. We understand that in some cases there may be results that are confusing or concerning to you. Please understand that not all results are received at the same time and often the doctors may need to interpret multiple results in order to provide you with the best plan of care or course of treatment. Therefore, we ask that you please give Wendy Molina 2 business days to thoroughly review all your results before contacting the office for clarification. Should we see a critical lab result, you will be contacted sooner.     If You Need Anything After Your Visit   If you have any questions or concerns for your doctor, please call our main line at [phone]. If no one answers, please leave a voicemail as directed and we will return your call as soon as possible. Messages left after 4 pm will be answered the following business day.    You may also send Wendy Molina a message via MyChart. We typically respond to MyChart messages within 1-2 business days.  For prescription refills, please ask your pharmacy to contact our office. Our fax number is [phone].  If you have an urgent issue when the clinic is closed that cannot wait until the next business day, you can page your doctor at the number below.     Please note that while we do our best to be available for urgent issues outside of office hours, we are not available 24/7.    If you have an urgent issue and are unable to reach Wendy Molina, you may choose to seek medical care at your doctor's office, retail clinic, urgent care center, or emergency room.   If you have a medical emergency, please immediately call 911 or go to the emergency department. In the event  of inclement weather, please call our main line at [phone] for an update on the status of any delays or closures.  Dermatology Medication Tips: Please keep the boxes that topical medications come in in order to help keep track of the instructions about where and how to use these. Pharmacies typically print the medication instructions only on the boxes and not directly on the medication tubes.   If your medication is too expensive, please contact our office at [phone] or send Wendy Molina a message through MyChart.    We are unable to tell what your co-pay for medications will be in advance as this is different depending on your insurance coverage. However, we may be able to find a substitute medication at lower cost or fill out paperwork to get insurance to cover a needed medication.    If a prior authorization is required to get your medication covered by your insurance company, please allow Wendy Molina 1-2 business days to complete this process.   Drug prices often vary depending on where the prescription is filled and some pharmacies may offer cheaper prices.   The website www.goodrx.com contains coupons for medications through different pharmacies. The prices here do not account for what the cost may be with help from insurance (it may be cheaper with your insurance), but the website can give you the price if you did not use any insurance.  - You can print the  associated coupon and take it with your prescription to the pharmacy.  - You may also stop by our office during regular business hours and pick up a GoodRx coupon card.  - If you need your prescription sent electronically to a different pharmacy, notify our office through [facility] or by phone at [phone]

## 2023-01-11 NOTE — Progress Notes (Signed)
   New Patient Visit   Subjective  Wendy Molina is a 37 y.o. female who presents for the following: Spot Check  Patient states she has skin tag located at the underneath right breast that she would like to have examined. Patient reports the areas have been there for several years. She reports the areas are not bothersome.Patient rates irritation 0 out of 10. She states that the areas have not spread. Patient reports she has not previously been treated for these areas. Patient denies Hx of bx. Patient denies family history of skin cancer(s).  The patient has spots, moles and lesions to be evaluated, some may be new or changing and the patient may have concern these could be cancer.  The following portions of the chart were reviewed this encounter and updated as appropriate: medications, allergies, medical history  Review of Systems:  No other skin or systemic complaints except as noted in HPI or Assessment and Plan.  Objective  Well appearing patient in no apparent distress; mood and affect are within normal limits.  A focused examination was performed of the following areas: Underneath left breast  Relevant exam findings are noted in the Assessment and Plan.  Chest - Medial (Center) (4) Verrucous papules   Assessment & Plan   DERMATOFIBROMA Exam: Firm pink/brown papulenodule with dimple sign. Treatment Plan: A dermatofibroma is a benign growth possibly related to trauma, such as an insect bite, cut from shaving, or inflamed acne-type bump.  Treatment options to remove include shave or excision with resulting scar and risk of recurrence.  Since benign-appearing and not bothersome, will observe for now.   Acrochordons (Skin Tags) - Fleshy, skin-colored pedunculated papules - Benign appearing.  - Observe. - If desired, they can be removed with an in office procedure that is not covered by insurance. - Please call the clinic if you notice any new or changing lesions.   WART Exam:  verrucous papule(s)  Counseling Discussed viral / HPV (Human Papilloma Virus) etiology and risk of spread /infectivity to other areas of body as well as to other people.  Multiple treatments and methods may be required to clear warts and it is possible treatment may not be successful.  Treatment risks include discoloration; scarring and there is still potential for wart recurrence.  Treatment Plan: VIRAL WARTS, UNSPECIFIED TYPE (4) Chest - Medial (Center) (4) Destruction of lesion - Chest - Medial (Center) (4) Complexity: simple   Destruction method: cryotherapy   Informed consent: discussed and consent obtained   Timeout:  patient name, date of birth, surgical site, and procedure verified Lesion destroyed using liquid nitrogen: Yes   Cryotherapy cycles:  4 Post-procedure details: wound care instructions given    Return if symptoms worsen or fail to improve, for Skin Tag Cryo F/U.  Documentation: I have reviewed the above documentation for accuracy and completeness, and I agree with the above.  Stasia Cavalier, am acting as scribe for Langston Reusing, DO.  Langston Reusing, DO

## 2023-01-12 ENCOUNTER — Encounter: Payer: Self-pay | Admitting: Family Medicine

## 2023-01-12 ENCOUNTER — Ambulatory Visit: Payer: BC Managed Care – PPO | Admitting: Family Medicine

## 2023-01-12 VITALS — BP 130/84 | HR 96 | Temp 97.6°F | Wt 266.8 lb

## 2023-01-12 DIAGNOSIS — S161XXA Strain of muscle, fascia and tendon at neck level, initial encounter: Secondary | ICD-10-CM

## 2023-01-12 DIAGNOSIS — Z23 Encounter for immunization: Secondary | ICD-10-CM | POA: Diagnosis not present

## 2023-01-12 DIAGNOSIS — R519 Headache, unspecified: Secondary | ICD-10-CM

## 2023-01-12 MED ORDER — TIZANIDINE HCL 4 MG PO TABS: 2.0000 mg | ORAL_TABLET | Freq: Three times a day (TID) | ORAL | 0 refills | Status: DC | PRN

## 2023-02-26 ENCOUNTER — Ambulatory Visit: Payer: BC Managed Care – PPO | Admitting: Family Medicine

## 2023-02-26 ENCOUNTER — Encounter: Payer: Self-pay | Admitting: Family Medicine

## 2023-02-26 VITALS — BP 112/82 | HR 90 | Temp 98.1°F | Wt 267.4 lb

## 2023-02-26 DIAGNOSIS — R0981 Nasal congestion: Secondary | ICD-10-CM | POA: Diagnosis not present

## 2023-02-26 DIAGNOSIS — J029 Acute pharyngitis, unspecified: Secondary | ICD-10-CM

## 2023-02-26 DIAGNOSIS — B9789 Other viral agents as the cause of diseases classified elsewhere: Secondary | ICD-10-CM

## 2023-02-26 DIAGNOSIS — J988 Other specified respiratory disorders: Secondary | ICD-10-CM

## 2023-02-26 LAB — POC INFLUENZA A&B (BINAX/QUICKVUE)
Influenza A, POC: NEGATIVE
Influenza B, POC: NEGATIVE

## 2023-02-26 LAB — POC COVID19 BINAXNOW: SARS Coronavirus 2 Ag: NEGATIVE

## 2023-02-26 LAB — POCT RAPID STREP A (OFFICE): Rapid Strep A Screen: NEGATIVE

## 2023-02-26 NOTE — Patient Instructions (Addendum)
Return in 2 weeks (on 03/12/2023), or if symptoms worsen or fail to improve.        Great to see you today.  I have refilled the medication(s) we provide.   If labs were collected or images ordered, we will inform you of  results once we have received them and reviewed. We will contact you either by echart message, or telephone call.  Please give ample time to the testing facility, and our office to run,  receive and review results. Please do not call inquiring of results, even if you can see them in your chart. We will contact you as soon as we are able. If it has been over 1 week since the test was completed, and you have not yet heard from [place], then please call us.    - echart message- for normal results that have been seen by the patient already.   - telephone call: abnormal results or if patient has not viewed results in their echart.  If a referral to a specialist was entered for you, please call us in 2 weeks if you have not heard from the specialist office to schedule.

## 2023-02-26 NOTE — Progress Notes (Signed)
Wendy Molina , Jul 01, 1985, 38 y.o., female MRN: [id number] Patient Care Team    Relationship Specialty Notifications Start End  Natalia Leatherwood, DO PCP - General Family Medicine  02/11/15   Patton Salles, MD Consulting Physician Obstetrics and Gynecology  10/26/16     Chief Complaint  Patient presents with   Sore Throat    Congestion; onset wed     Subjective: Wendy Molina is a 38 y.o. Pt presents for an OV with complaints of sore throat and nasal congestion of 2 days duration.  Associated symptoms include mild nonproductive cough. She reports she was around a young family member who had a fever and similar symptoms.  She is going out of town next week and wants to make sure she gets seen before leaving. Pt has tried dayquil to ease their symptoms.      12/15/2022    9:41 AM 11/30/2022    9:37 AM 08/31/2022    1:15 PM 06/16/2022    8:55 AM 11/25/2021    9:42 AM  Depression screen PHQ 2/9  Decreased Interest 0 0 1 0 1  Down, Depressed, Hopeless 0 0 1 0 0  PHQ - 2 Score 0 0 2 0 1  Altered sleeping   2 1 3   Tired, decreased energy   1 1 2   Change in appetite   1 1 2   Feeling bad or failure about yourself    0 0 0  Trouble concentrating   1 0 2  Moving slowly or fidgety/restless   0 0 0  Suicidal thoughts   0 0 0  PHQ-9 Score   7 3 10   Difficult doing work/chores    Somewhat difficult     No Known Allergies Social History   Social History Narrative   Single- in a relationship. No children. 3 cats.    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 dialysis   Allergies as of 02/26/2023   No Known Allergies      Medication List  Accurate as of February 26, 2023 11:53 AM. If you have any questions, ask your nurse or doctor.          amitriptyline 50 MG tablet Commonly known as: ELAVIL Take 1 tablet (50 mg total) by mouth at bedtime.   levonorgestrel 19.5 MG IUD Commonly known as: KYLEENA by Intrauterine route. Inserted 12/25/21   multivitamin with minerals Tabs tablet Take 1 tablet by mouth daily.   nystatin powder Commonly known as: MYCOSTATIN/NYSTOP Apply 1 Application topically 3 (three) times daily. Apply to affected area for up to 7 days   tiZANidine 4 MG tablet Commonly known as: Zanaflex Take 0.5-1 tablets (2-4 mg total) by mouth every 8 (eight) hours as needed for muscle spasms.   triamcinolone 55 MCG/ACT Aero nasal inhaler Commonly known as: NASACORT Place 2 sprays into the nose daily.   zolmitriptan 2.5 MG disintegrating tablet Commonly known as: ZOMIG-ZMT Take 1 tablet (2.5 mg total) by mouth as needed for migraine (may take repeat dose 2 hours later x1).        All past medical history, surgical history, allergies, family history, immunizations andmedications were updated in the EMR today and reviewed under the history and medication  portions of their EMR.     Review of Systems  Constitutional:  Negative for chills, fever and malaise/fatigue.  HENT:  Positive for congestion, ear pain, sinus pain and sore throat.   Respiratory:  Positive for cough. Negative for sputum production, shortness of breath and wheezing.   Gastrointestinal:  Negative for diarrhea, nausea and vomiting.  Musculoskeletal:  Positive for myalgias.  Skin:  Negative for rash.  Neurological:  Positive for headaches. Negative for dizziness.   Negative, with the exception of above mentioned in HPI   Objective:  BP 112/82   Pulse 90   Temp 98.1 F (36.7 C)   Wt 267 lb 6.4 oz (121.3 kg)   SpO2 98%   BMI 41.88 kg/m  Body mass index is 41.88 kg/m. Physical Exam Vitals and nursing note reviewed.  Constitutional:      General: She is not in acute distress.    Appearance: Normal appearance. She is normal weight. She is not ill-appearing or toxic-appearing.  HENT:     Head: Normocephalic and atraumatic.     Right Ear: Tympanic membrane, ear canal and external ear normal.     Left Ear: Tympanic membrane, ear canal and external ear normal.     Nose: Congestion and rhinorrhea present.     Mouth/Throat:     Mouth: Mucous membranes are moist.     Pharynx: No oropharyngeal exudate or posterior oropharyngeal erythema.     Comments: PND present Eyes:     General: No scleral icterus.       Right eye: No discharge.        Left eye: No discharge.     Extraocular Movements: Extraocular movements intact.     Conjunctiva/sclera: Conjunctivae normal.     Pupils: Pupils are equal, round, and reactive to light.  Cardiovascular:     Rate and Rhythm: Normal rate and regular rhythm.  Pulmonary:     Effort: Pulmonary effort is normal. No respiratory distress.     Breath sounds: Normal breath sounds. No wheezing, rhonchi or rales.  Musculoskeletal:     Cervical back: Neck supple.  Lymphadenopathy:     Cervical: No cervical adenopathy.  Skin:    Findings:  No rash.  Neurological:     Mental Status: She is alert and oriented  to person, place, and time. Mental status is at baseline.     Motor: No weakness.     Coordination: Coordination normal.     Gait: Gait normal.  Psychiatric:        Mood and Affect: Mood normal.        Behavior: Behavior normal.        Thought Content: Thought content normal.        Judgment: Judgment normal.      No results found. No results found. Results for orders placed or performed in visit on 02/26/23 (from the past 24 hours)  POCT rapid strep A     Status: None   Collection Time: 02/26/23 11:48 AM  Result Value Ref Range   Rapid Strep A Screen Negative Negative  POC COVID-19 BinaxNow     Status: None   Collection Time: 02/26/23 11:49 AM  Result Value Ref Range   SARS Coronavirus 2 Ag Negative Negative  POC Influenza A&B (Binax test)     Status: None   Collection Time: 02/26/23 11:49 AM  Result Value Ref Range   Influenza A, POC Negative Negative   Influenza B, POC Negative Negative    Assessment/Plan: Wendy Molina is a 38 y.o. female present for OV for  Viral resp illness: - rapid strep negative.  - influenza negative -  covid negative Viral illness> abx would not be helpful at this time.  Rest, hydrate.  OTC: mucinex (DM if cough), nettie pot or nasal saline.   If cough present it can last up to 6-8 weeks.  F/U 2 weeks if not improving.   Reviewed expectations re: course of current medical issues. Discussed self-management of symptoms. Outlined signs and symptoms indicating need for more acute intervention. Patient verbalized understanding and all questions were answered. Patient received an After-Visit Summary.    Orders Placed This Encounter  Procedures   POCT rapid strep A   POC COVID-19 BinaxNow   POC Influenza A&B (Binax test)   No orders of the defined types were placed in this encounter.  Referral Orders  No referral(s) requested today     Note is dictated  utilizing voice recognition software. Although note has been proof read prior to signing, occasional typographical errors still can be missed. If any questions arise, please do not hesitate to call for verification.   electronically signed by:  Felix Pacini, DO  [facility] [place]

## 2023-03-12 ENCOUNTER — Ambulatory Visit: Payer: BC Managed Care – PPO | Admitting: Family Medicine

## 2023-03-12 ENCOUNTER — Encounter: Payer: Self-pay | Admitting: Family Medicine

## 2023-03-12 VITALS — BP 128/74 | HR 70 | Temp 98.6°F | Wt 268.6 lb

## 2023-03-12 DIAGNOSIS — H9202 Otalgia, left ear: Secondary | ICD-10-CM

## 2023-03-12 NOTE — Progress Notes (Signed)
Wendy Molina , 1985-07-24, 38 y.o.,, female MRN: [id number] Patient Care Team    Relationship Specialty Notifications Start End  Natalia Leatherwood, DO PCP - General Family Medicine  02/11/15   Patton Salles, MD Consulting Physician Obstetrics and Gynecology  10/26/16     Chief Complaint  Patient presents with   Cough    Pt still c/o ongoing cough and congestion.       Subjective: Wendy Molina is a 38 y.o. Pt presents for an OV  Cough has improved last 2 days, but was bad up till then. No fever or chills.  Sore throat resolved quickly.  Congestion worse in the a.m. but clearing up more.  Ears are still feeling very clogged left> right.   Prior note: with complaints of sore throat and nasal congestion of 2 days duration.  Associated symptoms include mild nonproductive cough. She reports she was around a young family member who had a fever and similar symptoms.  She is going out of town next week and wants to make sure she gets seen before leaving. Pt has tried dayquil to ease their symptoms.      03/12/2023   10:33 AM 12/15/2022    9:41 AM 11/30/2022    9:37 AM 08/31/2022    1:15 PM 06/16/2022    8:55 AM  Depression screen PHQ 2/9  Decreased Interest 0 0 0 1 0  Down, Depressed, Hopeless 1 0 0 1 0  PHQ - 2 Score 1 0 0 2 0  Altered sleeping 1   2 1   Tired, decreased energy 1   1 1   Change in appetite 0   1 1  Feeling bad or failure about yourself  1   0 0  Trouble concentrating 0   1 0  Moving slowly or fidgety/restless 0   0 0  Suicidal thoughts 0   0 0  PHQ-9 Score 4   7 3   Difficult doing work/chores Somewhat difficult    Somewhat difficult    No Known Allergies Social History   Social History Narrative   Single- in a relationship. No children. 3 cats.    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   Hobbies: travel, reading, hiking   Feels safe in relationships    Past Medical History:  Diagnosis Date   Broken foot 08/2014   left   Headache(784.0) 2016   migraine w/o aura   Hyperlipidemia    Low vitamin D level    Lymphedema    foot   Migraines    Plantar fasciitis of left foot    Stress fracture of left foot 07/2015   confirmed with MRI   Past Surgical History:  Procedure Laterality Date   INTRAUTERINE DEVICE (IUD) INSERTION     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/01/14   INTRAUTERINE DEVICE INSERTION  12/04/2016   Kyleena    Family History  Problem Relation Age of Onset   Arthritis Mother    Colitis Mother    Hemachromatosis Brother    Heart disease Maternal Aunt        MI 55-56   Lung cancer Maternal Grandmother        smoker   Cancer Maternal Grandmother        kidney   Alzheimer's disease Maternal Grandfather    Leukemia Paternal Grandmother  unsure correct type of cancer   Heart failure Paternal Grandmother    Kidney failure Paternal Grandfather        ? dialysis   Allergies as of 03/12/2023   No Known Allergies      Medication List        Accurate as of March 12, 2023 11:06 AM. If you have any questions, ask your nurse or doctor.          amitriptyline 50 MG tablet Commonly known as: ELAVIL Take 1 tablet (50 mg total) by mouth at bedtime.   levonorgestrel 19.5 MG IUD Commonly known as: KYLEENA by Intrauterine route. Inserted 12/25/21   multivitamin with minerals Tabs tablet Take 1 tablet by mouth daily.   nystatin powder Commonly known as: MYCOSTATIN/NYSTOP Apply 1 Application topically 3 (three) times daily. Apply to affected area for up to 7 days   tiZANidine 4 MG tablet Commonly known as: Zanaflex Take 0.5-1 tablets (2-4 mg total) by mouth every 8 (eight) hours as needed for muscle spasms.   triamcinolone 55 MCG/ACT Aero nasal inhaler Commonly known as: NASACORT Place 2 sprays into the nose daily.   zolmitriptan 2.5 MG disintegrating tablet Commonly known as: ZOMIG-ZMT Take 1 tablet (2.5 mg  total) by mouth as needed for migraine (may take repeat dose 2 hours later x1).        All past medical history, surgical history, allergies, family history, immunizations andmedications were updated in the EMR today and reviewed under the history and medication portions of their EMR.     Review of Systems  Constitutional:  Negative for chills, fever and malaise/fatigue.  HENT:  Negative for congestion, ear pain, sinus pain and sore throat.   Respiratory:  Positive for cough. Negative for sputum production, shortness of breath and wheezing.   Gastrointestinal:  Negative for diarrhea, nausea and vomiting.  Musculoskeletal:  Negative for myalgias.  Skin:  Negative for rash.  Neurological:  Negative for dizziness and headaches.   Negative, with the exception of above mentioned in HPI   Objective:  BP 128/74   Pulse 70   Temp 98.6 F (37 C)   Wt 268 lb 9.6 oz (121.8 kg)   SpO2 96%   BMI 42.07 kg/m  Body mass index is 42.07 kg/m. Physical Exam Vitals and nursing note reviewed.  Constitutional:      General: She is not in acute distress.    Appearance: Normal appearance. She is normal weight. She is not ill-appearing or toxic-appearing.  HENT:     Head: Normocephalic and atraumatic.     Right Ear: Tympanic membrane, ear canal and external ear normal.     Left Ear: Tympanic membrane, ear canal and external ear normal.     Ears:     Comments: Small amount fluid/effusion left ear, no erythema, no bulging.     Nose: No congestion or rhinorrhea.     Mouth/Throat:     Mouth: Mucous membranes are moist.     Pharynx: No oropharyngeal exudate or posterior oropharyngeal erythema.     Comments: PND present Eyes:     General: No scleral icterus.       Right eye: No discharge.        Left eye: No discharge.     Extraocular Movements: Extraocular movements intact.     Conjunctiva/sclera: Conjunctivae normal.     Pupils: Pupils are equal, round, and reactive to light.   Cardiovascular:     Rate and Rhythm: Normal rate and regular  rhythm.  Pulmonary:     Effort: Pulmonary effort is normal. No respiratory distress.     Breath sounds: Normal breath sounds. No wheezing, rhonchi or rales.  Musculoskeletal:     Cervical back: Neck supple.  Lymphadenopathy:     Cervical: No cervical adenopathy.  Skin:    Findings: No rash.  Neurological:     Mental Status: She is alert and oriented to person, place, and time. Mental status is at baseline.     Motor: No weakness.     Coordination: Coordination normal.     Gait: Gait normal.  Psychiatric:        Mood and Affect: Mood normal.        Behavior: Behavior normal.        Thought Content: Thought content normal.        Judgment: Judgment normal.      No results found. No results found. No results found for this or any previous visit (from the past 24 hours).   Assessment/Plan: Wendy Molina is a 38 y.o. female present for OV for  otalgia Small effusion present s/p viral illness, no infection . OTC: mucinex (DM if cough), nettie pot or nasal saline.   Flonase bid Switch allergy med to xyzal.  Should resolve with time. If not can refer back to ENT to discuss options.   Reviewed expectations re: course of current medical issues. Discussed self-management of symptoms. Outlined signs and symptoms indicating need for more acute intervention. Patient verbalized understanding and all questions were answered. Patient received an After-Visit Summary.    No orders of the defined types were placed in this encounter.  No orders of the defined types were placed in this encounter.  Referral Orders  No referral(s) requested today     Note is dictated utilizing voice recognition software. Although note has been proof read prior to signing, occasional typographical errors still can be missed. If any questions arise, please do not hesitate to call for verification.   electronically signed by:  Felix Pacini, DO  [facility] [place]

## 2023-03-12 NOTE — Patient Instructions (Addendum)
Start xyzal at night Switch up nasal spray  Return if symptoms worsen or fail to improve.        Great to see you today.  I have refilled the medication(s) we provide.   If labs were collected or images ordered, we will inform you of  results once we have received them and reviewed. We will contact you either by echart message, or telephone call.  Please give ample time to the testing facility, and our office to run,  receive and review results. Please do not call inquiring of results, even if you can see them in your chart. We will contact you as soon as we are able. If it has been over 1 week since the test was completed, and you have not yet heard from [place], then please call us.    - echart message- for normal results that have been seen by the patient already.   - telephone call: abnormal results or if patient has not viewed results in their echart.  If a referral to a specialist was entered for you, please call us in 2 weeks if you have not heard from the specialist office to schedule.

## 2023-04-05 ENCOUNTER — Ambulatory Visit: Admitting: Family Medicine

## 2023-04-05 ENCOUNTER — Encounter: Payer: Self-pay | Admitting: Family Medicine

## 2023-04-05 VITALS — BP 128/80 | HR 88 | Temp 98.0°F | Wt 267.4 lb

## 2023-04-05 DIAGNOSIS — W57XXXA Bitten or stung by nonvenomous insect and other nonvenomous arthropods, initial encounter: Secondary | ICD-10-CM

## 2023-04-05 DIAGNOSIS — S80862A Insect bite (nonvenomous), left lower leg, initial encounter: Secondary | ICD-10-CM | POA: Diagnosis not present

## 2023-04-05 MED ORDER — DOXYCYCLINE HYCLATE 100 MG PO TABS: 100.0000 mg | ORAL_TABLET | Freq: Two times a day (BID) | ORAL | 0 refills | Status: DC

## 2023-04-05 MED ORDER — MUPIROCIN 2 % EX OINT: 1.0000 | TOPICAL_OINTMENT | Freq: Two times a day (BID) | CUTANEOUS | 0 refills | Status: DC

## 2023-04-05 NOTE — Progress Notes (Signed)
 Wendy Molina , Sep 09, 1985, 38 y.o., female MRN: [id number] Patient Care Team    Relationship Specialty Notifications Start End  Natalia Leatherwood, DO PCP - General Family Medicine  02/11/15   Patton Salles, MD Consulting Physician Obstetrics and Gynecology  10/26/16     Chief Complaint  Patient presents with   Leg Injury    L leg, 2 weeks ago. Wound has not healed since, continues to become read and irritated. Pt mentions drainage. Denies any falls. Pt has applied Neosporin.      Subjective: Wendy Molina is a 38 y.o. Pt presents for an OV with complaints of wound of her left lower extremity of 2 weeks duration.  Associated symptoms include slow to heal.  She states she woke up with an area on her left anterior lower extremity/ankle area like a bug bite.  She has been placing Neosporin over the area, but has concerns it is healing appropriately.  She denies any current drainage.  She states it has been red and the redness seems to not be fading.  She reports it was itchy..  Pt has tried Neosporin to ease their symptoms.      04/05/2023    2:37 PM 03/12/2023   10:33 AM 12/15/2022    9:41 AM 11/30/2022    9:37 AM 08/31/2022    1:15 PM  Depression screen PHQ 2/9  Decreased Interest 1 0 0 0 1  Down, Depressed, Hopeless 1 1 0 0 1  PHQ - 2 Score 2 1 0 0 2  Altered sleeping 1 1   2   Tired, decreased energy 1 1   1   Change in appetite 1 0   1  Feeling bad or failure about yourself  1 1   0  Trouble concentrating 0 0   1  Moving slowly or fidgety/restless 0 0   0  Suicidal thoughts 0 0   0  PHQ-9 Score 6 4   7   Difficult doing work/chores Somewhat difficult Somewhat difficult       No Known Allergies Social History   Social History Narrative   Single- in a relationship. No children. 3 cats.    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   Hobbies: travel, reading, hiking   Feels  safe in relationships   Past Medical History:  Diagnosis Date   Broken foot 08/2014   left   Headache(784.0) 2016   migraine w/o aura   Hyperlipidemia    Low vitamin D level    Lymphedema    foot   Migraines    Plantar fasciitis of left foot    Stress fracture of left foot 07/2015   confirmed with MRI   Past Surgical History:  Procedure Laterality Date   INTRAUTERINE DEVICE (IUD) INSERTION     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/01/14   INTRAUTERINE DEVICE INSERTION  12/04/2016   Kyleena    Family History  Problem Relation Age of Onset   Arthritis Mother    Colitis Mother    Hemachromatosis Brother    Heart disease Maternal Aunt        MI 55-56   Lung cancer Maternal Grandmother        smoker   Cancer Maternal Grandmother        kidney   Alzheimer's disease Maternal Grandfather    Leukemia Paternal Grandmother  unsure correct type of cancer   Heart failure Paternal Grandmother    Kidney failure Paternal Grandfather        ? dialysis   Allergies as of 04/05/2023   No Known Allergies      Medication List        Accurate as of April 05, 2023  2:47 PM. If you have any questions, ask your nurse or doctor.          amitriptyline 50 MG tablet Commonly known as: ELAVIL Take 1 tablet (50 mg total) by mouth at bedtime.   doxycycline 100 MG tablet Commonly known as: VIBRA-TABS Take 1 tablet (100 mg total) by mouth 2 (two) times daily. Started by: Felix Pacini   levonorgestrel 19.5 MG IUD Commonly known as: KYLEENA by Intrauterine route. Inserted 12/25/21   multivitamin with minerals Tabs tablet Take 1 tablet by mouth daily.   mupirocin ointment 2 % Commonly known as: BACTROBAN Apply 1 Application topically 2 (two) times daily. Started by: Felix Pacini   nystatin powder Commonly known as: MYCOSTATIN/NYSTOP Apply 1 Application topically 3 (three) times daily. Apply to affected area for up to 7 days   tiZANidine 4 MG tablet Commonly known as: Zanaflex Take  0.5-1 tablets (2-4 mg total) by mouth every 8 (eight) hours as needed for muscle spasms.   triamcinolone 55 MCG/ACT Aero nasal inhaler Commonly known as: NASACORT Place 2 sprays into the nose daily.   zolmitriptan 2.5 MG disintegrating tablet Commonly known as: ZOMIG-ZMT Take 1 tablet (2.5 mg total) by mouth as needed for migraine (may take repeat dose 2 hours later x1).        All past medical history, surgical history, allergies, family history, immunizations andmedications were updated in the EMR today and reviewed under the history and medication portions of their EMR.     ROS Negative, with the exception of above mentioned in HPI   Objective:  BP 128/80   Pulse 88   Temp 98 F (36.7 C)   Wt 267 lb 6.4 oz (121.3 kg)   SpO2 99%   BMI 41.88 kg/m  Body mass index is 41.88 kg/m.  Physical Exam Vitals and nursing note reviewed.  Constitutional:      General: She is not in acute distress.    Appearance: Normal appearance. She is normal weight. She is not ill-appearing or toxic-appearing.  HENT:     Head: Normocephalic and atraumatic.  Eyes:     General: No scleral icterus.       Right eye: No discharge.        Left eye: No discharge.     Extraocular Movements: Extraocular movements intact.     Conjunctiva/sclera: Conjunctivae normal.     Pupils: Pupils are equal, round, and reactive to light.  Skin:    Findings: Lesion (Left lower extremity with very mild erythema surrounding a centralized scab formation.  No drainage.  No bleeding.  No tenderness to touch.  Mild hyperpigmentation surrounding lesion today.) present. No rash.  Neurological:     Mental Status: She is alert and oriented to person, place, and time. Mental status is at baseline.     Motor: No weakness.     Coordination: Coordination normal.     Gait: Gait normal.  Psychiatric:        Mood and Affect: Mood normal.        Behavior: Behavior normal.        Thought Content: Thought content normal.  Judgment: Judgment normal.      No results found. No results found. No results found for this or any previous visit (from the past 24 hours).  Assessment/Plan: [name] is a 38 y.o. female present for OV for  Insect bite of left lower leg, initial encounter (Primary) Area of concern appears consistent with a healing insect bite.  There is no drainage present today, very mild erythema. Discussed the slight discoloration may last for a few months Bactroban ointment prescribed twice daily x 5-7 days Doxycycline twice daily x 5 days  Reviewed expectations re: course of current medical issues. Discussed self-management of symptoms. Outlined signs and symptoms indicating need for more acute intervention. Patient verbalized understanding and all questions were answered. Patient received an After-Visit Summary.    No orders of the defined types were placed in this encounter.  Meds ordered this encounter  Medications   mupirocin ointment (BACTROBAN) 2 %    Sig: Apply 1 Application topically 2 (two) times daily.    Dispense:  22 g    Refill:  0   doxycycline (VIBRA-TABS) 100 MG tablet    Sig: Take 1 tablet (100 mg total) by mouth 2 (two) times daily.    Dispense:  10 tablet    Refill:  0   Referral Orders  No referral(s) requested today     Note is dictated utilizing voice recognition software. Although note has been proof read prior to signing, occasional typographical errors still can be missed. If any questions arise, please do not hesitate to call for verification.   electronically signed by:  Felix Pacini, DO  [facility] [place]

## 2023-07-22 DIAGNOSIS — D23121 Other benign neoplasm of skin of left upper eyelid, including canthus: Secondary | ICD-10-CM | POA: Diagnosis not present

## 2023-07-22 DIAGNOSIS — D23122 Other benign neoplasm of skin of left lower eyelid, including canthus: Secondary | ICD-10-CM | POA: Diagnosis not present

## 2023-07-29 ENCOUNTER — Ambulatory Visit: Admitting: Family Medicine

## 2023-07-29 VITALS — BP 122/82 | HR 88 | Temp 98.0°F | Wt 266.0 lb

## 2023-07-29 DIAGNOSIS — S39012A Strain of muscle, fascia and tendon of lower back, initial encounter: Secondary | ICD-10-CM

## 2023-07-29 MED ORDER — NAPROXEN 500 MG PO TABS: 500.0000 mg | ORAL_TABLET | Freq: Two times a day (BID) | ORAL | 0 refills | Status: DC

## 2023-07-29 MED ORDER — METHYLPREDNISOLONE ACETATE 80 MG/ML IJ SUSP
80.0000 mg | Freq: Once | INTRAMUSCULAR | Status: AC
  Administered 2023-07-29: 80 mg via INTRAMUSCULAR

## 2023-07-29 MED ORDER — TIZANIDINE HCL 4 MG PO TABS: 2.0000 mg | ORAL_TABLET | Freq: Three times a day (TID) | ORAL | 0 refills | Status: DC | PRN

## 2023-07-29 NOTE — Progress Notes (Signed)
 Wendy Molina , June 27, 1985, 38 y.o., female MRN: [id number] Patient Care Team    Relationship Specialty Notifications Start End  Catherine Charlies LABOR, DO PCP - General Family Medicine  02/11/15   Cathlyn JAYSON Nikki Bobie FORBES, MD Consulting Physician Obstetrics and Gynecology  10/26/16     Chief Complaint  Patient presents with   Back Pain    A few days; L side. Pain increases after sitting for long periods of time. Denies any urinary concern. Pt has taken Motrin and Tylenol.      Subjective: Wendy Molina is a 38 y.o. Pt presents for an OV with complaints of left sided back pain of 4 days duration.  Associated symptoms include sitting long periods of times-while sitting. Trouble getting comfortable in bed. No injury she is aware of, or increase in activity.   Pt has tried motrin/tylenol to ease their symptoms. Motrin help some.      04/05/2023    2:37 PM 03/12/2023   10:33 AM 12/15/2022    9:41 AM 11/30/2022    9:37 AM 08/31/2022    1:15 PM  Depression screen PHQ 2/9  Decreased Interest 1 0 0 0 1  Down, Depressed, Hopeless 1 1 0 0 1  PHQ - 2 Score 2 1 0 0 2  Altered sleeping 1 1   2   Tired, decreased energy 1 1   1   Change in appetite 1 0   1  Feeling bad or failure about yourself  1 1   0  Trouble concentrating 0 0   1  Moving slowly or fidgety/restless 0 0   0  Suicidal thoughts 0 0   0  PHQ-9 Score 6 4   7   Difficult doing work/chores Somewhat difficult Somewhat difficult       No Known Allergies Social History   Social History Narrative   Single- in a relationship. No children. 3 cats.    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 dialysis   Allergies as of 07/29/2023   No Known Allergies  Medication List        Accurate as of July 29, 2023  8:54 AM. If you have any questions, ask your nurse or doctor.          STOP taking these medications    doxycycline  100 MG tablet Commonly known as: VIBRA -TABS Stopped by: Elaysia Devargas   mupirocin  ointment 2 % Commonly known as: BACTROBAN  Stopped by: Edra Riccardi   nystatin  powder Commonly known as: MYCOSTATIN /NYSTOP  Stopped by: Charlies Bellini       TAKE these medications    amitriptyline  50 MG tablet Commonly known as: ELAVIL  Take 1 tablet (50 mg total) by mouth at bedtime.   levonorgestrel  19.5 MG IUD Commonly known as: KYLEENA  by Intrauterine route. Inserted 12/25/21   multivitamin with minerals Tabs tablet Take 1 tablet by mouth daily.   naproxen 500 MG tablet Commonly known as: NAPROSYN Take 1 tablet (500 mg total) by mouth 2 (two) times daily with a meal. Started by: Charlies Bellini   tiZANidine  4 MG tablet Commonly known as: Zanaflex  Take 0.5-1 tablets (2-4 mg total) by mouth every 8 (eight) hours as needed for muscle spasms.   triamcinolone  55 MCG/ACT Aero nasal inhaler Commonly known as: NASACORT  Place  2 sprays into the nose daily.   zolmitriptan  2.5 MG disintegrating tablet Commonly known as: ZOMIG -ZMT Take 1 tablet (2.5 mg total) by mouth as needed for migraine (may take repeat dose 2 hours later x1).        All past medical history, surgical history, allergies, family history, immunizations andmedications were updated in the EMR today and reviewed under the history and medication portions of their EMR.     ROS Negative, with the exception of above mentioned in HPI   Objective:  BP 122/82   Pulse 88   Temp 98 F (36.7 C)   Wt 266 lb (120.7 kg)   SpO2 98%   BMI 41.66 kg/m  Body mass index is 41.66 kg/m.  Physical Exam Vitals and nursing note reviewed.  Constitutional:      General: She is not in acute distress.    Appearance: Normal appearance. She is normal weight. She is not ill-appearing or toxic-appearing.  HENT:     Head: Normocephalic and atraumatic.  Eyes:     General: No scleral icterus.       Right eye: No discharge.        Left eye: No discharge.     Extraocular Movements: Extraocular movements intact.     Conjunctiva/sclera: Conjunctivae normal.     Pupils: Pupils are equal, round, and reactive to light.  Musculoskeletal:        General: Tenderness present. No swelling or deformity.     Comments: Lumbar: no erythema, bruising or swelling. Ropiness of left lower lumbar paraspinal muscles. TTP over ropiness. SI- normal. Decreased ROM flexion and b/l side bending d/t discomfort. MS 5/5 b/l. DTR equal b/l.   Skin:    Findings: No rash.  Neurological:     Mental Status: She is alert and oriented to person, place, and time. Mental status is at baseline.     Motor: No weakness.     Coordination: Coordination normal.     Gait: Gait normal.  Psychiatric:        Mood and Affect: Mood normal.        Behavior: Behavior normal.        Thought Content: Thought content normal.        Judgment: Judgment normal.  No results found. No results found. No  results found for this or any previous visit (from the past 24 hours).  Assessment/Plan: Wendy Molina is a 38 y.o. female present for OV for  Lumbar strain, initial encounter (Primary) Heat and stretches.  Zanaflex  tid prn Naproxen BID x5-7 days with food.  IM depo medrol  80 today F/u prn   Reviewed expectations re: course of current medical issues. Discussed self-management of symptoms. Outlined signs and symptoms indicating need for more acute intervention. Patient verbalized understanding and all questions were answered. Patient received an After-Visit Summary.    No orders of the defined types were placed in this encounter.  Meds ordered this encounter  Medications   tiZANidine  (ZANAFLEX ) 4 MG tablet    Sig: Take 0.5-1 tablets (2-4 mg total) by mouth every 8 (eight) hours as needed for muscle spasms.    Dispense:  90 tablet    Refill:  0   naproxen (NAPROSYN) 500 MG tablet    Sig: Take 1 tablet (500 mg total) by mouth 2 (two) times daily with a meal.    Dispense:  14 tablet    Refill:  0   methylPREDNISolone  acetate (DEPO-MEDROL ) injection 80 mg   Referral Orders  No referral(s) requested today     Note is dictated utilizing voice recognition software. Although note has been proof read prior to signing, occasional typographical errors still can be missed. If any questions arise, please do not hesitate to call for verification.   electronically signed by:  Charlies Bellini, DO  [facility] - [place]

## 2023-07-29 NOTE — Patient Instructions (Addendum)

## 2023-08-03 ENCOUNTER — Encounter: Payer: Self-pay | Admitting: Dermatology

## 2023-08-03 ENCOUNTER — Ambulatory Visit: Admitting: Dermatology

## 2023-08-03 VITALS — BP 123/81

## 2023-08-03 DIAGNOSIS — L918 Other hypertrophic disorders of the skin: Secondary | ICD-10-CM

## 2023-08-03 NOTE — Progress Notes (Signed)
   Follow-Up Visit   Subjective  Wendy Molina is a 38 y.o. female who presents for the following: Skin tag of right inframammary area and left axilla that she picks at and it gets irritated.   The following portions of the chart were reviewed this encounter and updated as appropriate: medications, allergies, medical history  Review of Systems:  No other skin or systemic complaints except as noted in HPI or Assessment and Plan.  Objective  Well appearing patient in no apparent distress; mood and affect are within normal limits.   A focused examination was performed of the following areas: Inframammary area   Relevant exam findings are noted in the Assessment and Plan.  Right Inframammary Fold x 3, left axilla x 1 (4) Pedunculated flesh papules with erythema  Assessment & Plan     INFLAMED SKIN TAG (4) Right Inframammary Fold x 3, left axilla x 1 (4) Destruction of lesion - Right Inframammary Fold x 3, left axilla x 1 (4) Complexity: simple   Destruction method: cryotherapy   Informed consent: discussed and consent obtained   Timeout:  patient name, date of birth, surgical site, and procedure verified Lesion destroyed using liquid nitrogen: Yes   Region frozen until ice ball extended beyond lesion: Yes   Outcome: patient tolerated procedure well with no complications   Post-procedure details: wound care instructions given     Return if symptoms worsen or fail to improve.  I, Roseline Hutchinson, CMA, am acting as scribe for [employer], DO .   Documentation: I have reviewed the above documentation for accuracy and completeness, and I agree with the above.  Delon Lenis, DO

## 2023-08-03 NOTE — Patient Instructions (Signed)

## 2023-10-08 ENCOUNTER — Ambulatory Visit: Admitting: Family Medicine

## 2023-10-08 VITALS — BP 120/80 | HR 88 | Temp 98.3°F | Wt 268.6 lb

## 2023-10-08 DIAGNOSIS — R21 Rash and other nonspecific skin eruption: Secondary | ICD-10-CM

## 2023-10-08 DIAGNOSIS — L0291 Cutaneous abscess, unspecified: Secondary | ICD-10-CM

## 2023-10-08 MED ORDER — TIZANIDINE HCL 4 MG PO TABS
2.0000 mg | ORAL_TABLET | Freq: Three times a day (TID) | ORAL | 0 refills | Status: AC | PRN
Start: 2023-10-08 — End: ?

## 2023-10-08 MED ORDER — DOXYCYCLINE HYCLATE 100 MG PO TABS
100.0000 mg | ORAL_TABLET | Freq: Two times a day (BID) | ORAL | 0 refills | Status: DC
Start: 2023-10-08 — End: 2023-12-20

## 2023-10-08 NOTE — Patient Instructions (Signed)

## 2023-10-08 NOTE — Progress Notes (Signed)
 Wendy Molina , [birth year], 38 y.o., female MRN: [id number] Patient Care Team    Relationship Specialty Notifications Start End  Catherine Charlies LABOR, DO PCP - General Family Medicine  02/11/15   Cathlyn JAYSON Nikki Bobie FORBES, MD Consulting Physician Obstetrics and Gynecology  10/26/16     Chief Complaint  Patient presents with   Breast Problem    Pt has a painful area on her LEFT breast near her bra line.     Subjective: Wendy Molina is a 38 y.o. Pt presents for an OV with complaints of rash of 5 days duration.  She reports she has had it before and it usually goes away on its on in a day or two.  She reports this time it is getting more puffy and red. Reports achy feeling. No itching.  Pt has tried warm compress and tylenol to ease their symptoms.      04/05/2023    2:37 PM 03/12/2023   10:33 AM 12/15/2022    9:41 AM 11/30/2022    9:37 AM 08/31/2022    1:15 PM  Depression screen PHQ 2/9  Decreased Interest 1 0 0 0 1  Down, Depressed, Hopeless 1 1 0 0 1  PHQ - 2 Score 2 1 0 0 2  Altered sleeping 1 1   2   Tired, decreased energy 1 1   1   Change in appetite 1 0   1  Feeling bad or failure about yourself  1 1   0  Trouble concentrating 0 0   1  Moving slowly or fidgety/restless 0 0   0  Suicidal thoughts 0 0   0  PHQ-9 Score 6 4   7   Difficult doing work/chores Somewhat difficult Somewhat difficult       No Known Allergies Social History   Social History Narrative   Single- in a relationship. No children. 3 cats.    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   Hobbies: travel, reading, hiking   Feels safe in relationships   Past Medical History:  Diagnosis Date   Broken foot 08/2014   left   Headache(784.0) 2016   migraine w/o aura   Hyperlipidemia    Low vitamin D  level    Lymphedema    foot   Migraines    Plantar fasciitis of left foot    Stress fracture of left foot  07/2015   confirmed with MRI   Past Surgical History:  Procedure Laterality Date   INTRAUTERINE DEVICE (IUD) INSERTION     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/01/14   INTRAUTERINE DEVICE INSERTION  12/04/2016   Kyleena     Family History  Problem Relation Age of Onset   Arthritis Mother    Colitis Mother    Hemachromatosis Brother    Heart disease Maternal Aunt        MI 13-56   Lung cancer Maternal Grandmother        smoker   Cancer Maternal Grandmother        kidney   Alzheimer's disease Maternal Grandfather    Leukemia Paternal Grandmother        unsure correct type of cancer   Heart failure Paternal Grandmother    Kidney failure Paternal Grandfather        ? dialysis   Allergies as of 10/08/2023   No Known Allergies  Medication List        Accurate as of October 08, 2023 12:31 PM. If you have any questions, ask your nurse or doctor.          amitriptyline  50 MG tablet Commonly known as: ELAVIL  Take 1 tablet (50 mg total) by mouth at bedtime.   doxycycline  100 MG tablet Commonly known as: VIBRA -TABS Take 1 tablet (100 mg total) by mouth 2 (two) times daily.   levonorgestrel  19.5 MG IUD Commonly known as: KYLEENA  by Intrauterine route. Inserted 12/25/21   multivitamin with minerals Tabs tablet Take 1 tablet by mouth daily.   naproxen  500 MG tablet Commonly known as: NAPROSYN  Take 1 tablet (500 mg total) by mouth 2 (two) times daily with a meal.   tiZANidine  4 MG tablet Commonly known as: Zanaflex  Take 0.5-1 tablets (2-4 mg total) by mouth every 8 (eight) hours as needed for muscle spasms.   triamcinolone  55 MCG/ACT Aero nasal inhaler Commonly known as: NASACORT  Place 2 sprays into the nose daily.   zolmitriptan  2.5 MG disintegrating tablet Commonly known as: ZOMIG -ZMT Take 1 tablet (2.5 mg total) by mouth as needed for migraine (may take repeat dose 2 hours later x1).        All past medical history, surgical history, allergies, family history,  immunizations andmedications were updated in the EMR today and reviewed under the history and medication portions of their EMR.     ROS Negative, with the exception of above mentioned in HPI   Objective:  BP 120/80   Pulse 88   Temp 98.3 F (36.8 C) (Temporal)   Wt 268 lb 9.6 oz (121.8 kg)   SpO2 97%   BMI 42.07 kg/m  Body mass index is 42.07 kg/m. Physical Exam Vitals and nursing note reviewed.  Constitutional:      General: She is not in acute distress.    Appearance: Normal appearance. She is normal weight. She is not ill-appearing or toxic-appearing.  HENT:     Head: Normocephalic and atraumatic.  Eyes:     General: No scleral icterus.       Right eye: No discharge.        Left eye: No discharge.     Extraocular Movements: Extraocular movements intact.     Conjunctiva/sclera: Conjunctivae normal.     Pupils: Pupils are equal, round, and reactive to light.  Chest:  Breasts:    Right: Normal.     Left: Swelling and tenderness present.       Comments: Approximately 1.5 cm abscess present left medial breast.  Erythema and swelling surrounding by another 2 cm.  Tenderness to palpation. Skin:    Findings: No rash.  Neurological:     Mental Status: She is alert and oriented to person, place, and time. Mental status is at baseline.     Motor: No weakness.     Coordination: Coordination normal.     Gait: Gait normal.  Psychiatric:        Mood and Affect: Mood normal.        Behavior: Behavior normal.        Thought Content: Thought content normal.        Judgment: Judgment normal.      No results found. No results found. No results found for this or any previous visit (from the past 24 hours).  Assessment/Plan: Wendy Molina is a 38 y.o. female present for OV for  Rash/ abscess Discussed with patient this is a small abscess possibly  recurrent secondary to a epidermal cyst present at this location.  This is a recurrent condition for her a few times a year,  per her report.  This is the only time it has since self resolved within a couple days. Continue warm compresses Doxycycline  twice daily x 7 days Patient was advised this will probably continue to recur until the epidermal cyst or like structure is surgically removed when not infected.  Patient reports understanding and would like to wait for now. Follow-up as needed  Reviewed expectations re: course of current medical issues. Discussed self-management of symptoms. Outlined signs and symptoms indicating need for more acute intervention. Patient verbalized understanding and all questions were answered. Patient received an After-Visit Summary.    No orders of the defined types were placed in this encounter.  Meds ordered this encounter  Medications   doxycycline  (VIBRA -TABS) 100 MG tablet    Sig: Take 1 tablet (100 mg total) by mouth 2 (two) times daily.    Dispense:  14 tablet    Refill:  0   tiZANidine  (ZANAFLEX ) 4 MG tablet    Sig: Take 0.5-1 tablets (2-4 mg total) by mouth every 8 (eight) hours as needed for muscle spasms.    Dispense:  90 tablet    Refill:  0   Referral Orders  No referral(s) requested today     Note is dictated utilizing voice recognition software. Although note has been proof read prior to signing, occasional typographical errors still can be missed. If any questions arise, please do not hesitate to call for verification.   electronically signed by:  Charlies Bellini, DO  [facility] - [place]

## 2023-11-02 ENCOUNTER — Encounter: Payer: Self-pay | Admitting: Podiatrist

## 2023-11-02 ENCOUNTER — Ambulatory Visit (INDEPENDENT_AMBULATORY_CARE_PROVIDER_SITE_OTHER): Admitting: Podiatrist

## 2023-11-02 ENCOUNTER — Ambulatory Visit

## 2023-11-02 DIAGNOSIS — M722 Plantar fascial fibromatosis: Secondary | ICD-10-CM | POA: Diagnosis not present

## 2023-11-02 MED ORDER — MELOXICAM 15 MG PO TABS: 15.0000 mg | ORAL_TABLET | Freq: Every day | ORAL | 2 refills | Status: AC

## 2023-11-02 NOTE — Patient Instructions (Signed)
Plantar Fasciitis (Heel Spur Syndrome) with Rehab The plantar fascia is a fibrous, ligament-like, soft-tissue structure that spans the bottom of the foot. Plantar fasciitis is a condition that causes pain in the foot due to inflammation of the tissue. SYMPTOMS  Pain and tenderness on the underneath side of the foot. Pain that worsens with standing or walking. CAUSES  Plantar fasciitis is caused by irritation and injury to the plantar fascia on the underneath side of the foot. Common mechanisms of injury include: Direct trauma to bottom of the foot. Damage to a small nerve that runs under the foot where the main fascia attaches to the heel bone. Stress placed on the plantar fascia due to any mild increased activity or injury RISK INCREASES WITH:  Obesity. Poor strength and flexibility. Improperly fitted shoes. Tight calf muscles. Flat feet. Failure to warm-up properly before activity.  PREVENTION Warm up and stretch properly before activity. Strength, flexibility Maintain a health body weight. Avoid stress on the plantar fascia. Wear properly fitted shoes, including arch supports for individuals who have flat feet. PROGNOSIS  If treated properly, then the symptoms of plantar fasciitis usually resolve without surgery. However, occasionally surgery is necessary. RELATED COMPLICATIONS  Recurrent symptoms that may result in a chronic condition. Problems of the lower back that are caused by compensating for the injury, such as limping. Pain or weakness of the foot during push-off following surgery. Chronic inflammation, scarring, and partial or complete fascia tear, occurring more often from repeated injections. TREATMENT  Treatment initially involves the use of ice and medication to help reduce pain and inflammation. The use of strengthening and stretching exercises may help reduce pain with activity, especially stretches of the Achilles tendon.  Your caregiver may recommend that you use  arch supports to help reduce stress on the plantar fascia. Often, corticosteroid injections are given to reduce inflammation. If symptoms persist for greater than 6 months despite non-surgical (conservative), then surgery may be recommended.  MEDICATION  If pain medication is necessary, then nonsteroidal anti-inflammatory medications, such as aspirin and ibuprofen, or other minor pain relievers, such as acetaminophen, are often recommended. Corticosteroid injections may be given by your caregiver.  HEAT AND COLD Cold treatment (icing) relieves pain and reduces inflammation. Cold treatment should be applied for 10 to 15 minutes every 2 to 3 hours for inflammation and pain and immediately after any activity that aggravates your symptoms. Use ice packs or massage the area with a piece of ice (ice massage). Heat treatment may be used prior to performing the stretching and strengthening activities prescribed by your caregiver, physical therapist, or athletic trainer. Use a heat pack or soak the injury in warm water. SEEK IMMEDIATE MEDICAL CARE IF: Treatment seems to offer no benefit, or the condition worsens. Any medications produce adverse side effects.  Perform this particular stretch daily first thing in the morning and before you go to bed. Hold for 30 seconds.    Try all the exercises and choose your favorite 3 to perform daily--  EXERCISES-- perform each exercise a total of 10-15 repetitions.  Hold for 30 seconds and perform 3 times per day   RANGE OF MOTION (ROM) AND STRETCHING EXERCISES - Plantar Fasciitis (Heel Spur Syndrome) These exercises may help you when beginning to rehabilitate your injury.   While completing these exercises, remember:  Restoring tissue flexibility helps normal motion to return to the joints. This allows healthier, less painful movement and activity. An effective stretch should be held for at least 30 seconds.   A stretch should never be painful. You should only feel  a gentle lengthening or release in the stretched tissue. RANGE OF MOTION - Toe Extension, Flexion Sit with your right / left leg crossed over your opposite knee. Grasp your toes and gently pull them back toward the top of your foot. You should feel a stretch on the bottom of your toes and/or foot. Hold this stretch for __________ seconds. Now, gently pull your toes toward the bottom of your foot. You should feel a stretch on the top of your toes and or foot. Hold this stretch for __________ seconds. Repeat __________ times. Complete this stretch __________ times per day.  RANGE OF MOTION - Ankle Dorsiflexion, Active Assisted Remove shoes and sit on a chair that is preferably not on a carpeted surface. Place right / left foot under knee. Extend your opposite leg for support. Keeping your heel down, slide your right / left foot back toward the chair until you feel a stretch at your ankle or calf. If you do not feel a stretch, slide your bottom forward to the edge of the chair, while still keeping your heel down. Hold this stretch for __________ seconds. Repeat __________ times. Complete this stretch __________ times per day.  STRETCH  Gastroc, Standing Place hands on wall. Extend right / left leg, keeping the front knee somewhat bent. Slightly point your toes inward on your back foot. Keeping your right / left heel on the floor and your knee straight, shift your weight toward the wall, not allowing your back to arch. You should feel a gentle stretch in the right / left calf. Hold this position for __________ seconds. Repeat __________ times. Complete this stretch __________ times per day. STRETCH  Soleus, Standing Place hands on wall. Extend right / left leg, keeping the other knee somewhat bent. Slightly point your toes inward on your back foot. Keep your right / left heel on the floor, bend your back knee, and slightly shift your weight over the back leg so that you feel a gentle stretch  deep in your back calf. Hold this position for __________ seconds. Repeat __________ times. Complete this stretch __________ times per day. STRETCH  Gastrocsoleus, Standing  Note: This exercise can place a lot of stress on your foot and ankle. Please complete this exercise only if specifically instructed by your caregiver.  Place the ball of your right / left foot on a step, keeping your other foot firmly on the same step. Hold on to the wall or a rail for balance. Slowly lift your other foot, allowing your body weight to press your heel down over the edge of the step. You should feel a stretch in your right / left calf. Hold this position for __________ seconds. Repeat this exercise with a slight bend in your right / left knee. Repeat __________ times. Complete this stretch __________ times per day.    

## 2023-11-02 NOTE — Progress Notes (Signed)
 Chief Complaint  Patient presents with   Foot Pain    Plantar heel bilateral - aching x months, trip to [place] upcoming for a month, history of PF, tried injections and NSAID in the past that helped, tries soaking and massaging at home   New Patient (Initial Visit)    Est pt 2023     Subjective: Wendy Molina is a 38 y.o. female patient presents to office with complaint of moderate heel pain on bilateral  heels. Patient admits to post static dyskinesia for several months in duration. Patient has treated this problem with injections and antiinflammatories in the past which provided relief for her at that time. She is getting ready to travel to [place] for a month.   Denies any other pedal complaints.   Patient Active Problem List   Diagnosis Date Noted   Rosacea 02/06/2021   Elevated LFTs 02/15/2020   Hyperlipidemia    Encounter for long-term current use of medication 10/23/2015   Obesity 10/23/2015   Vitamin D  deficiency 02/12/2015   Chronic migraine w/o aura w/o status migrainosus, not intractable     Current Outpatient Medications on File Prior to Visit  Medication Sig Dispense Refill   amitriptyline  (ELAVIL ) 50 MG tablet Take 1 tablet (50 mg total) by mouth at bedtime. 90 tablet 3   doxycycline  (VIBRA -TABS) 100 MG tablet Take 1 tablet (100 mg total) by mouth 2 (two) times daily. 14 tablet 0   levonorgestrel  (KYLEENA ) 19.5 MG IUD by Intrauterine route. Inserted 12/25/21     Multiple Vitamin (MULTIVITAMIN WITH MINERALS) TABS tablet Take 1 tablet by mouth daily.     naproxen  (NAPROSYN ) 500 MG tablet Take 1 tablet (500 mg total) by mouth 2 (two) times daily with a meal. 14 tablet 0   tiZANidine  (ZANAFLEX ) 4 MG tablet Take 0.5-1 tablets (2-4 mg total) by mouth every 8 (eight) hours as needed for muscle spasms. 90 tablet 0   triamcinolone  (NASACORT ) 55 MCG/ACT AERO nasal inhaler Place 2 sprays into the nose daily. 1 each 12   zolmitriptan  (ZOMIG -ZMT) 2.5 MG disintegrating tablet  Take 1 tablet (2.5 mg total) by mouth as needed for migraine (may take repeat dose 2 hours later x1). 10 tablet 11   No current facility-administered medications on file prior to visit.    No Known Allergies  Objective: Physical Exam General: The patient is alert and oriented x3 in no acute distress.  Dermatology: Skin is warm, dry and supple bilateral lower extremities. Nails 1-10 are normal. There is no erythema, edema, no eccymosis, no open lesions present. Integument is otherwise unremarkable.  Vascular: Dorsalis Pedis pulse and Posterior Tibial pulse are 2/4 bilateral. Capillary fill time is immediate to all digits.  Neurological: Grossly intact to light touch with an achilles reflex of +2/5 and a  negative Tinel's sign bilateral.  Musculoskeletal: Tenderness to palpation at the medial calcaneal tubercale and through the insertion of the plantar fascia on the Bilateral foot. No pain with compression of calcaneus bilateral. No pain with tuning fork to calcaneus bilateral. No pain with calf compression bilateral. There is decreased Ankle joint range of motion bilateral. All other joints range of motion within normal limits bilateral. Strength 5/5 in all groups bilateral.   Xray, bilateral foot:  Normal osseous mineralization. Joint spaces preserved. No fracture/dislocation/boney destruction. small calcaneal spur present with mild thickening of plantar fascia bilateral heels . No other soft tissue abnormalities or radiopaque foreign bodies.   Assessment and Plan: Problem List Items Addressed This Visit  None Visit Diagnoses       Plantar fasciitis, bilateral    -  Primary   Relevant Orders   DG Foot Complete Right (Completed)   DG Foot Complete Left (Completed)       -Complete examination performed.  -Xrays reviewed - Discussed etiology and pathology of plantar fasciitis along with conservative approach to treatment.   -After oral consent and aseptic prep, injected a mixture  containing 20mg  Kenalog  and 5mg  marcaine plain was infiltrated into the area of maximal tenderness of the Bilateral heels. Post-injection care discussed with patient.  -Rx Meloxicam  to start for 2 weeks and taper was advised after 2 weeks.  -Recommended good supportive shoes and advised use of OTC insert.   -Explained and dispensed to patient daily stretching exercises.. -Patient to return to office after she returns from her trip to [place] if symptoms worsen or fail to improve.

## 2023-12-01 ENCOUNTER — Encounter: Payer: BC Managed Care – PPO | Admitting: Family Medicine

## 2023-12-14 ENCOUNTER — Encounter: Admitting: Family Medicine

## 2023-12-20 ENCOUNTER — Ambulatory Visit: Payer: BC Managed Care – PPO | Admitting: Obstetrics and Gynecology

## 2023-12-20 ENCOUNTER — Encounter: Payer: Self-pay | Admitting: Obstetrics and Gynecology

## 2023-12-20 ENCOUNTER — Other Ambulatory Visit (HOSPITAL_COMMUNITY)
Admission: RE | Admit: 2023-12-20 | Discharge: 2023-12-20 | Disposition: A | Discharge status: Home / Self Care | Source: Ambulatory Visit | Attending: Obstetrics and Gynecology | Admitting: Obstetrics and Gynecology

## 2023-12-20 VITALS — BP 122/84 | HR 106 | Ht 67.0 in | Wt 265.0 lb

## 2023-12-20 DIAGNOSIS — Z114 Encounter for screening for human immunodeficiency virus [HIV]: Secondary | ICD-10-CM | POA: Diagnosis not present

## 2023-12-20 DIAGNOSIS — Z113 Encounter for screening for infections with a predominantly sexual mode of transmission: Secondary | ICD-10-CM | POA: Diagnosis not present

## 2023-12-20 DIAGNOSIS — Z1331 Encounter for screening for depression: Secondary | ICD-10-CM

## 2023-12-20 DIAGNOSIS — Z01419 Encounter for gynecological examination (general) (routine) without abnormal findings: Secondary | ICD-10-CM

## 2023-12-20 DIAGNOSIS — Z1159 Encounter for screening for other viral diseases: Secondary | ICD-10-CM | POA: Diagnosis not present

## 2023-12-20 NOTE — Progress Notes (Signed)
 38 y.o. G0P0000 Domestic Partner Caucasian female here for annual exam.    Had a period for 2 weeks while traveling.  She has had postcoital bleeding and it is occurring less often.   She had a negative work up prior to having her Kyleena  IUD exchanged in 2023.   Normal pelvic US , neg STD screening, and normal pap with neg HR HPV testing.  Has a little discomfort when the bleeding occurs.  Using a lubricant for sex.    Sporadic bleeding with Kyleena .   Wants STD screening.   Spent a month in [place].   PCP: Catherine Fuller A, DO   No LMP recorded. (Menstrual status: IUD).           Sexually active: Yes.    The current method of family planning is IUD-Kyleena  12/25/21 .    Menopausal hormone therapy:  n/a Exercising: Yes.    Walking  Smoker:  no  OB History  Gravida Para Term Preterm AB Living  0 0 0 0 0 0  SAB IAB Ectopic Multiple Live Births  0 0 0 0 0     HEALTH MAINTENANCE: Last 2 paps:  10/02/21 neg HR HPV neg, 09/17/17 neg HPV neg History of abnormal Pap or positive HPV:  no Mammogram:   n/a Colonoscopy:  n/a Bone Density:  n/a  Result  n/a   Immunization History  Administered Date(s) Administered   HPV 9-valent 10/01/2017, 11/26/2017, 09/21/2018   PFIZER(Purple Top)SARS-COV-2 Vaccination 04/27/2019, 05/23/2019, 06/27/2020   Tdap 06/22/2012, 01/12/2023      reports that she has never smoked. She has never been exposed to tobacco smoke. She has never used smokeless tobacco. She reports current alcohol use of about 5.0 - 6.0 standard drinks of alcohol per week. She reports that she does not use drugs.  Past Medical History:  Diagnosis Date   Broken foot 08/2014   left   Headache(784.0) 2016   migraine w/o aura   Hyperlipidemia    Low vitamin D  level    Lymphedema    foot   Migraines    Plantar fasciitis of left foot    Stress fracture of left foot 07/2015   confirmed with MRI    Past Surgical History:  Procedure Laterality Date   INTRAUTERINE DEVICE  (IUD) INSERTION     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/01/14   INTRAUTERINE DEVICE INSERTION  12/25/2021   Kyleena      Current Outpatient Medications  Medication Sig Dispense Refill   amitriptyline  (ELAVIL ) 50 MG tablet Take 1 tablet (50 mg total) by mouth at bedtime. 90 tablet 3   levonorgestrel  (KYLEENA ) 19.5 MG IUD by Intrauterine route. Inserted 12/25/21     meloxicam  (MOBIC ) 15 MG tablet Take 1 tablet (15 mg total) by mouth daily. Take daily for 2 weeks then may taper off.  Take with food 30 tablet 2   Multiple Vitamin (MULTIVITAMIN WITH MINERALS) TABS tablet Take 1 tablet by mouth daily.     naproxen  (NAPROSYN ) 500 MG tablet Take 1 tablet (500 mg total) by mouth 2 (two) times daily with a meal. 14 tablet 0   tiZANidine  (ZANAFLEX ) 4 MG tablet Take 0.5-1 tablets (2-4 mg total) by mouth every 8 (eight) hours as needed for muscle spasms. 90 tablet 0   triamcinolone  (NASACORT ) 55 MCG/ACT AERO nasal inhaler Place 2 sprays into the nose daily. 1 each 12   zolmitriptan  (ZOMIG -ZMT) 2.5 MG disintegrating tablet Take 1 tablet (2.5 mg total) by mouth as needed for migraine (may take  repeat dose 2 hours later x1). 10 tablet 11   No current facility-administered medications for this visit.    ALLERGIES: Patient has no known allergies.  Family History  Problem Relation Age of Onset   Arthritis Mother    Colitis Mother    Hemachromatosis Brother    Heart disease Maternal Aunt        MI 58-56   Lung cancer Maternal Grandmother        smoker   Cancer Maternal Grandmother        kidney   Alzheimer's disease Maternal Grandfather    Leukemia Paternal Grandmother        unsure correct type of cancer   Heart failure Paternal Grandmother    Kidney failure Paternal Grandfather        ? dialysis    Review of Systems  All other systems reviewed and are negative.   PHYSICAL EXAM:  BP 122/84 (BP Location: Left Arm, Patient Position: Sitting)   Pulse (!) 106   Ht 5' 7 (1.702 m)   Wt 265 lb (120.2 kg)   SpO2  97%   BMI 41.50 kg/m     General appearance: alert, cooperative and appears stated age Head: normocephalic, without obvious abnormality, atraumatic Neck: no adenopathy, supple, symmetrical, trachea midline and thyroid  normal to inspection and palpation Lungs: clear to auscultation bilaterally Breasts: normal appearance, no masses or tenderness, bilateral nipple retraction, No nipple discharge or bleeding, No axillary adenopathy Heart: regular rate and rhythm Abdomen: soft, non-tender; no masses, no organomegaly Extremities: extremities normal, atraumatic, no cyanosis or edema Skin: skin color, texture, turgor normal. No rashes or lesions Lymph nodes: cervical, supraclavicular, and axillary nodes normal. Neurologic: grossly normal  Pelvic: External genitalia:  no lesions              No abnormal inguinal nodes palpated.              Urethra:  normal appearing urethra with no masses, tenderness or lesions              Bartholins and Skenes: normal                 Vagina: normal appearing vagina with normal color and discharge, no lesions              Cervix: no lesions.  IUD strings noted.  Light menstrual flow present.               Pap taken: no Bimanual Exam:  Uterus:  normal size, contour, position, consistency, mobility, non-tender              Adnexa: no mass, fullness, tenderness               Chaperone was present for exam:  Kari HERO, CMA  ASSESSMENT: Well woman visit with gynecologic exam. Kyleena  IUD.  Hx postcoital bleeding.  STD screening.  Migraine HA without aura.  On Elavil . Bilateral nipple inversion.  PHQ-2-9: 0  PLAN: Mammogram screening discussed.  Mammogram after age 54.   Self breast awareness reviewed. Pap and HRV collected:  no.  Due in 2028. Guidelines for Calcium, Vitamin D , regular exercise program including cardiovascular and weight bearing exercise. Medication refills:  NA STD screening.  Routine labs with PCP.  Follow up:  yearly and prn.

## 2023-12-20 NOTE — Patient Instructions (Signed)

## 2023-12-21 LAB — HIV ANTIBODY (ROUTINE TESTING W REFLEX)
HIV 1&2 Ab, 4th Generation: NONREACTIVE
HIV FINAL INTERPRETATION: NEGATIVE

## 2023-12-21 LAB — SYPHILIS: RPR W/REFLEX TO RPR TITER AND TREPONEMAL ANTIBODIES, TRADITIONAL SCREENING AND DIAGNOSIS ALGORITHM: RPR Ser Ql: NONREACTIVE

## 2023-12-21 LAB — HEPATITIS C ANTIBODY: Hepatitis C Ab: NONREACTIVE

## 2023-12-22 ENCOUNTER — Ambulatory Visit: Payer: Self-pay | Admitting: Obstetrics and Gynecology

## 2023-12-22 LAB — CERVICOVAGINAL ANCILLARY ONLY
Chlamydia: NEGATIVE
Comment: NEGATIVE
Comment: NEGATIVE
Comment: NORMAL
Neisseria Gonorrhea: NEGATIVE
Trichomonas: NEGATIVE

## 2024-01-10 ENCOUNTER — Encounter: Payer: Self-pay | Admitting: Family Medicine

## 2024-01-10 ENCOUNTER — Ambulatory Visit: Admitting: Family Medicine

## 2024-01-10 VITALS — BP 124/80 | HR 90 | Temp 97.9°F | Wt 272.4 lb

## 2024-01-10 DIAGNOSIS — Z79899 Other long term (current) drug therapy: Secondary | ICD-10-CM

## 2024-01-10 DIAGNOSIS — Z2821 Immunization not carried out because of patient refusal: Secondary | ICD-10-CM | POA: Diagnosis not present

## 2024-01-10 DIAGNOSIS — E782 Mixed hyperlipidemia: Secondary | ICD-10-CM | POA: Diagnosis not present

## 2024-01-10 DIAGNOSIS — G43709 Chronic migraine without aura, not intractable, without status migrainosus: Secondary | ICD-10-CM | POA: Diagnosis not present

## 2024-01-10 DIAGNOSIS — Z1231 Encounter for screening mammogram for malignant neoplasm of breast: Secondary | ICD-10-CM | POA: Diagnosis not present

## 2024-01-10 DIAGNOSIS — E559 Vitamin D deficiency, unspecified: Secondary | ICD-10-CM

## 2024-01-10 DIAGNOSIS — Z0000 Encounter for general adult medical examination without abnormal findings: Secondary | ICD-10-CM | POA: Diagnosis not present

## 2024-01-10 LAB — LIPID PANEL
Cholesterol: 171 mg/dL (ref 0–200)
HDL: 59.2 mg/dL (ref >39.00)
LDL Cholesterol: 88 mg/dL (ref 0–99)
NonHDL: 111.96
Total CHOL/HDL Ratio: 3
Triglycerides: 122 mg/dL (ref 0.0–149.0)
VLDL: 24.4 mg/dL (ref 0.0–40.0)

## 2024-01-10 LAB — CBC
HCT: 43.9 % (ref 36.0–46.0)
Hemoglobin: 15 g/dL (ref 12.0–15.0)
MCHC: 34.2 g/dL (ref 30.0–36.0)
MCV: 94.5 fl (ref 78.0–100.0)
Platelets: 223 K/uL (ref 150.0–400.0)
RBC: 4.65 Mil/uL (ref 3.87–5.11)
RDW: 13.4 % (ref 11.5–15.5)
WBC: 9.3 K/uL (ref 4.0–10.5)

## 2024-01-10 LAB — COMPREHENSIVE METABOLIC PANEL WITH GFR
ALT: 17 U/L (ref 0–35)
AST: 15 U/L (ref 0–37)
Albumin: 4.2 g/dL (ref 3.5–5.2)
Alkaline Phosphatase: 65 U/L (ref 39–117)
BUN: 15 mg/dL (ref 6–23)
CO2: 29 meq/L (ref 19–32)
Calcium: 9.1 mg/dL (ref 8.4–10.5)
Chloride: 103 meq/L (ref 96–112)
Creatinine, Ser: 0.64 mg/dL (ref 0.40–1.20)
GFR: 111.5 mL/min (ref >60.00)
Glucose, Bld: 92 mg/dL (ref 70–99)
Potassium: 4.6 meq/L (ref 3.5–5.1)
Sodium: 137 meq/L (ref 135–145)
Total Bilirubin: 0.5 mg/dL (ref 0.2–1.2)
Total Protein: 6.5 g/dL (ref 6.0–8.3)

## 2024-01-10 LAB — TSH: TSH: 3.75 u[IU]/mL (ref 0.35–5.50)

## 2024-01-10 LAB — VITAMIN D 25 HYDROXY (VIT D DEFICIENCY, FRACTURES): VITD: 20.46 ng/mL — ABNORMAL LOW (ref 30.00–100.00)

## 2024-01-10 LAB — HEMOGLOBIN A1C: Hgb A1c MFr Bld: 4.8 % (ref 4.6–6.5)

## 2024-01-10 MED ORDER — ZOLMITRIPTAN 2.5 MG PO TBDP: 2.5000 mg | ORAL_TABLET | ORAL | 11 refills | Status: AC | PRN

## 2024-01-10 MED ORDER — AMITRIPTYLINE HCL 50 MG PO TABS: 50.0000 mg | ORAL_TABLET | Freq: Every day | ORAL | 3 refills | Status: AC

## 2024-01-10 NOTE — Progress Notes (Signed)
 Patient ID: Wendy Molina, female  DOB: May 17, 1985, 38 y.o.   MRN: [id number] Patient Care Team    Relationship Specialty Notifications Start End  Catherine Charlies LABOR, DO PCP - General Family Medicine  02/11/15   Cathlyn JAYSON Nikki Bobie FORBES, MD Consulting Physician Obstetrics and Gynecology  10/26/16     Chief Complaint  Patient presents with   Annual Exam    Pt is fasting.     Subjective: Wendy Molina is a 38 y.o.  Female  present for CPE and Chronic Conditions/illness Management All past medical history, surgical history, allergies, family history, immunizations, medications and social history were updated in the electronic medical record today. All recent labs, ED visits and hospitalizations within the last year were reviewed.   Migraine Patient reports compliance with her amitriptyline  50 mg before bed.  She has not routinely needed the Zomig  when taking the amitriptyline .no complaints   Vit d def: Pt reports compliance with vit d daily supplement daily   Health maintenance: Colon cancer screen: No Fhx, screen at 45 Mammogram: No Fhx screen at 40> ordered for after her birthday Cervical cancer screening: last pap: 09/2021,  Dr. Nikki, Normal 5-year follow-up. Immunizations: tdap-UTD 12/2022,  Influenza-declined(encouraged yearly) Infectious disease screening: HIV and Hep c completed DEXA: Recommend screening 55-60 with history of vitamin D  deficiency chronically.  Review of Systems  Constitutional: Negative.   HENT: Negative.    Eyes: Negative.   Respiratory: Negative.    Cardiovascular: Negative.   Gastrointestinal: Negative.   Genitourinary: Negative.   Musculoskeletal: Negative.   Skin: Negative.   Neurological: Negative.   Endo/Heme/Allergies: Negative.   Psychiatric/Behavioral: Negative.    All other systems reviewed and are negative.       01/10/2024    9:34 AM 12/20/2023    2:53 PM 04/05/2023    2:37 PM 03/12/2023   10:33 AM 12/15/2022    9:41 AM   Depression screen PHQ 2/9  Decreased Interest 0 0 1 0 0  Down, Depressed, Hopeless 0 0 1 1 0  PHQ - 2 Score 0 0 2 1 0  Altered sleeping   1 1   Tired, decreased energy   1 1   Change in appetite   1 0   Feeling bad or failure about yourself    1 1   Trouble concentrating   0 0   Moving slowly or fidgety/restless   0 0   Suicidal thoughts   0 0   PHQ-9 Score   6  4    Difficult doing work/chores   Somewhat difficult Somewhat difficult      Data saved with a previous flowsheet row definition      04/05/2023    2:37 PM 03/12/2023   10:33 AM 08/31/2022    1:15 PM 06/16/2022    8:55 AM  GAD 7 : Generalized Anxiety Score  Nervous, Anxious, on Edge 1 1 1 1   Control/stop worrying 0 0 1 0  Worry too much - different things 0 0 1 0  Trouble relaxing 1 0 1 1  Restless 0 0 0 0  Easily annoyed or irritable 1 0 1 1  Afraid - awful might happen 0 0 0 0  Total GAD 7 Score 3 1 5 3   Anxiety Difficulty Not difficult at all Not difficult at all  Not difficult at all       11/30/2022    9:37 AM 03/12/2023   10:33 AM 04/05/2023  2:37 PM 12/20/2023    2:53 PM 01/10/2024    9:34 AM  Fall Risk  Falls in the past year? 0 0 0 0 0  Was there an injury with Fall? 0    0  0  Fall Risk Category Calculator 0   0 0  Patient at Risk for Falls Due to No Fall Risks   No Fall Risks No Fall Risks  Fall risk Follow up Falls evaluation completed Falls evaluation completed Falls evaluation completed Falls evaluation completed Falls evaluation completed     Data saved with a previous flowsheet row definition    Immunization History  Administered Date(s) Administered   HPV 9-valent 10/01/2017, 11/26/2017, 09/21/2018   PFIZER(Purple Top)SARS-COV-2 Vaccination 04/27/2019, 05/23/2019, 06/27/2020   Tdap 06/22/2012, 01/12/2023    Past Medical History:  Diagnosis Date   Broken foot 08/2014   left   Headache(784.0) 2016   migraine w/o aura   Hyperlipidemia    Low vitamin D  level    Lymphedema    foot    Migraines    Plantar fasciitis of left foot    Stress fracture of left foot 07/2015   confirmed with MRI   No Known Allergies Past Surgical History:  Procedure Laterality Date   INTRAUTERINE DEVICE (IUD) INSERTION     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/01/14   INTRAUTERINE DEVICE INSERTION  12/25/2021   Kyleena     Family History  Problem Relation Age of Onset   Arthritis Mother    Colitis Mother    Hemachromatosis Brother    Heart disease Maternal Aunt        MI 71-56   Lung cancer Maternal Grandmother        smoker   Cancer Maternal Grandmother        kidney   Alzheimer's disease Maternal Grandfather    Leukemia Paternal Grandmother        unsure correct type of cancer   Heart failure Paternal Grandmother    Kidney failure Paternal Grandfather        ? dialysis   Social History   Social History Narrative   Single- in a relationship. No children. 3 cats.    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   Hobbies: travel, reading, hiking   Feels safe in relationships    Allergies as of 01/10/2024   No Known Allergies      Medication List        Accurate as of January 10, 2024  9:35 AM. If you have any questions, ask your nurse or doctor.          STOP taking these medications    naproxen  500 MG tablet Commonly known as: NAPROSYN  Stopped by: Charlies Bellini, DO   tiZANidine  4 MG tablet Commonly known as: Zanaflex  Stopped by: Charlies Bellini, DO       TAKE these medications    amitriptyline  50 MG tablet Commonly known as: ELAVIL  Take 1 tablet (50 mg total) by mouth at bedtime.   levonorgestrel  19.5 MG IUD Commonly known as: KYLEENA  by Intrauterine route. Inserted 12/25/21   meloxicam  15 MG tablet Commonly known as: MOBIC  Take 1 tablet (15 mg total) by mouth daily. Take daily for 2 weeks then may taper off.  Take with food   multivitamin with minerals Tabs tablet Take 1 tablet by mouth  daily.   triamcinolone  55 MCG/ACT Aero nasal inhaler Commonly known as: NASACORT   Place 2 sprays into the nose daily.   zolmitriptan  2.5 MG disintegrating tablet Commonly known as: ZOMIG -ZMT Take 1 tablet (2.5 mg total) by mouth as needed for migraine (may take repeat dose 2 hours later x1).        All past medical history, surgical history, allergies, family history, immunizations andmedications were updated in the EMR today and reviewed under the history and medication portions of their EMR.      No results found.   ROS: 14 pt review of systems performed and negative (unless mentioned in an HPI)  Objective: BP 124/80   Pulse 90   Temp 97.9 F (36.6 C)   Wt 272 lb 6.4 oz (123.6 kg)   SpO2 98%   BMI 42.66 kg/m  Physical Exam Vitals and nursing note reviewed.  Constitutional:      General: She is not in acute distress.    Appearance: Normal appearance. She is not ill-appearing or toxic-appearing.  HENT:     Head: Normocephalic and atraumatic.     Right Ear: Tympanic membrane, ear canal and external ear normal. There is no impacted cerumen.     Left Ear: Tympanic membrane, ear canal and external ear normal. There is no impacted cerumen.     Nose: No congestion or rhinorrhea.     Mouth/Throat:     Mouth: Mucous membranes are moist.     Pharynx: Oropharynx is clear. No oropharyngeal exudate or posterior oropharyngeal erythema.  Eyes:     General: No scleral icterus.       Right eye: No discharge.        Left eye: No discharge.     Extraocular Movements: Extraocular movements intact.     Conjunctiva/sclera: Conjunctivae normal.     Pupils: Pupils are equal, round, and reactive to light.  Cardiovascular:     Rate and Rhythm: Normal rate and regular rhythm.     Pulses: Normal pulses.     Heart sounds: Normal heart sounds. No murmur heard.    No friction rub. No gallop.  Pulmonary:     Effort: Pulmonary effort is normal. No respiratory distress.     Breath sounds:  Normal breath sounds. No stridor. No wheezing, rhonchi or rales.  Chest:     Chest wall: No tenderness.  Abdominal:     General: Abdomen is flat. Bowel sounds are normal. There is no distension.     Palpations: Abdomen is soft. There is no mass.     Tenderness: There is no abdominal tenderness. There is no right CVA tenderness, left CVA tenderness, guarding or rebound.     Hernia: No hernia is present.  Musculoskeletal:        General: No swelling, tenderness or deformity. Normal range of motion.     Cervical back: Normal range of motion and neck supple. No rigidity or tenderness.     Right lower leg: No edema.     Left lower leg: No edema.  Lymphadenopathy:     Cervical: No cervical adenopathy.  Skin:    General: Skin is warm and dry.     Coloration: Skin is not jaundiced or pale.     Findings: No bruising, erythema, lesion or rash.  Neurological:     General: No focal deficit present.     Mental Status: She is alert and oriented to person, place, and time. Mental status is at baseline.     Cranial Nerves: No cranial nerve deficit.     Sensory: No sensory deficit.  Motor: No weakness.     Coordination: Coordination normal.     Gait: Gait normal.     Deep Tendon Reflexes: Reflexes normal.  Psychiatric:        Mood and Affect: Mood normal.        Behavior: Behavior normal.        Thought Content: Thought content normal.        Judgment: Judgment normal.    No results found.  Assessment/plan: Wendy Molina is a 38 y.o. female present for CPE and chronic condition management Mixed hyperlipidemia/mobid obesity Lipids collected today  Vitamin D  deficiency Vitamin D  collected today Encouraged her to make sure she is supplementing with 1000u vit d daily.  Chronic migraine w/o aura w/o status migrainosus, not intractable Stable Continue amitrip 50 at bedtime Continue zomig  prn  Influenza vaccination declined  Morbid obesity (HCC) - Hemoglobin A1c - Lipid  panel - Vitamin D  (25 hydroxy)  Encounter for long-term current use of medication - CBC - Comprehensive metabolic panel with GFR - Hemoglobin A1c - Lipid panel - TSH - Vitamin D  (25 hydroxy)  Breast cancer screening by mammogram - MM 3D SCREENING MAMMOGRAM BILATERAL BREAST; Future  Routine general medical examination at a health care facility Patient was encouraged to exercise greater than 150 minutes a week. Patient was encouraged to choose a diet filled with fresh fruits and vegetables, and lean meats. AVS provided to patient today for education/recommendation on gender specific health and safety maintenance. Colon cancer screen: No Fhx, screen at 45 Mammogram: No Fhx screen at 40> ordered for after her birthday Cervical cancer screening: last pap: 09/2021,  Dr. Nikki, Normal 5-year follow-up. Immunizations: tdap-UTD 12/2022,  Influenza-declined(encouraged yearly) Infectious disease screening: HIV and Hep c completed DEXA: Recommend screening 55-60 with history of vitamin D  deficiency chronically.  Return in about 1 year (around 01/10/2025) for cpe (20 min), Routine chronic condition follow-up.   Orders Placed This Encounter  Procedures   MM 3D SCREENING MAMMOGRAM BILATERAL BREAST   CBC   Comprehensive metabolic panel with GFR   Hemoglobin A1c   Lipid panel   TSH   Vitamin D  (25 hydroxy)    Meds ordered this encounter  Medications   zolmitriptan  (ZOMIG -ZMT) 2.5 MG disintegrating tablet    Sig: Take 1 tablet (2.5 mg total) by mouth as needed for migraine (may take repeat dose 2 hours later x1).    Dispense:  10 tablet    Refill:  11   amitriptyline  (ELAVIL ) 50 MG tablet    Sig: Take 1 tablet (50 mg total) by mouth at bedtime.    Dispense:  90 tablet    Refill:  3    Referral Orders  No referral(s) requested today     Electronically signed by: Charlies Bellini, DO [facility] [place]

## 2024-01-10 NOTE — Patient Instructions (Addendum)

## 2024-01-11 ENCOUNTER — Ambulatory Visit: Payer: Self-pay | Admitting: Family Medicine

## 2024-02-07 ENCOUNTER — Ambulatory Visit: Admitting: Dermatology

## 2024-02-07 ENCOUNTER — Encounter: Payer: Self-pay | Admitting: Dermatology

## 2024-02-07 DIAGNOSIS — Z1283 Encounter for screening for malignant neoplasm of skin: Secondary | ICD-10-CM

## 2024-02-07 DIAGNOSIS — B078 Other viral warts: Secondary | ICD-10-CM | POA: Diagnosis not present

## 2024-02-07 DIAGNOSIS — L578 Other skin changes due to chronic exposure to nonionizing radiation: Secondary | ICD-10-CM

## 2024-02-07 DIAGNOSIS — L814 Other melanin hyperpigmentation: Secondary | ICD-10-CM | POA: Diagnosis not present

## 2024-02-07 DIAGNOSIS — D229 Melanocytic nevi, unspecified: Secondary | ICD-10-CM

## 2024-02-07 DIAGNOSIS — L821 Other seborrheic keratosis: Secondary | ICD-10-CM

## 2024-02-07 DIAGNOSIS — W908XXA Exposure to other nonionizing radiation, initial encounter: Secondary | ICD-10-CM

## 2024-02-07 DIAGNOSIS — D1801 Hemangioma of skin and subcutaneous tissue: Secondary | ICD-10-CM

## 2024-02-07 NOTE — Patient Instructions (Signed)

## 2024-02-07 NOTE — Progress Notes (Signed)
 "  Total Body Skin Exam (TBSE) Visit  Patient (and/or pt guardian) consented to the use of AI-assisted tools for note generation.    Subjective  Wendy Molina is a 39 y.o. female who presents for the following: Skin Cancer Screening and Full Body Skin Exam  Patient presents today for follow up visit for TBSE.  Patient was last evaluated on 08/03/23 .  Patient denies medication changes. Patient reports she does not have spots, moles and lesions of concern to be evaluated. Patient reports throughout her lifetime she has had moderate sun exposure.  Currently, patient reports if she has excessive sun exposure, she does apply sunscreen and/or wears protective coverings. Patient reports she does not have hx of bx.  Patient denies  family history of skin cancers. The patient has spots, moles and lesions to be evaluated, some may be new or changing and the patient has concerns that these could be cancer.  The following portions of the chart were reviewed this encounter and updated as appropriate: medications, allergies, medical history  Review of Systems:  No other skin or systemic complaints except as noted in HPI or Assessment and Plan.  Objective  Well appearing patient in no apparent distress; mood and affect are within normal limits.  A full examination was performed including scalp, head, eyes, ears, nose, lips, neck, chest, axillae, abdomen, back, buttocks, bilateral upper extremities, bilateral lower extremities, hands, feet, fingers, toes, fingernails, and toenails. All findings within normal limits unless otherwise noted below.   Relevant physical exam findings are noted in the Assessment and Plan.    Assessment & Plan   LENTIGINES, SEBORRHEIC KERATOSES, HEMANGIOMAS - Benign normal skin lesions - Benign-appearing - Call for any changes  MELANOCYTIC NEVI - Tan-brown and/or pink-flesh-colored symmetric macules and papules - Benign appearing on exam today - Observation - Call  clinic for new or changing moles - Recommend daily use of broad spectrum spf 30+ sunscreen to sun-exposed areas.   ACTINIC DAMAGE - Chronic condition, secondary to cumulative UV/sun exposure - diffuse scaly erythematous macules with underlying dyspigmentation - Recommend daily broad spectrum sunscreen SPF 30+ to sun-exposed areas, reapply every 2 hours as needed.  - Staying in the shade or wearing long sleeves, sun glasses (UVA+UVB protection) and wide brim hats (4-inch brim around the entire circumference of the hat) are also recommended for sun protection.  - Call for new or changing lesions.  SKIN CANCER SCREENING PERFORMED TODAY.  WART Exam: verrucous papule(s)  Counseling Discussed viral / HPV (Human Papilloma Virus) etiology and risk of spread /infectivity to other areas of body as well as to other people.  Multiple treatments and methods may be required to clear warts and it is possible treatment may not be successful.  Treatment risks include discoloration; scarring and there is still potential for wart recurrence.  Treatment Plan: Destruction Procedure Note Destruction method: cryotherapy   Informed consent: discussed and consent obtained   Lesion destroyed using liquid nitrogen: Yes   Outcome: patient tolerated procedure well with no complications   Post-procedure details: wound care instructions given   Locations: left lower eye lid, left upper eyelid # of Lesions Treated: 2 with 4 cycles total  Prior to procedure, discussed risks of blister formation, small wound, skin dyspigmentation, or rare scar following cryotherapy. Recommend Vaseline ointment to treated areas while healing.    OTHER VIRAL WARTS (2) Left Lower Eyelid, Left Upper Eyelid - Destruction of lesion - Left Lower Eyelid, Left Upper Eyelid Return in 1 year (  on 02/06/2025) for TBSE f/u.  LILLETTE Lyle Cords, am acting as a neurosurgeon for [employer], DO .   Documentation: I have reviewed the above  documentation for accuracy and completeness, and I agree with the above.  Delon Lenis, DO   "

## 2024-12-27 ENCOUNTER — Ambulatory Visit: Admitting: Obstetrics and Gynecology

## 2025-01-10 ENCOUNTER — Encounter: Admitting: Family Medicine
